# Patient Record
Sex: Female | Born: 1980 | Race: White | Hispanic: No | Marital: Married | Smoking: Never smoker
Health system: Southern US, Community
[De-identification: ages and names within clinical notes are randomized; demographics above are authoritative.]

## PROBLEM LIST (undated history)

## (undated) DIAGNOSIS — Z98811 Dental restoration status: Secondary | ICD-10-CM

## (undated) DIAGNOSIS — Z9889 Other specified postprocedural states: Secondary | ICD-10-CM

## (undated) DIAGNOSIS — M1990 Unspecified osteoarthritis, unspecified site: Secondary | ICD-10-CM

## (undated) DIAGNOSIS — F329 Major depressive disorder, single episode, unspecified: Secondary | ICD-10-CM

## (undated) DIAGNOSIS — R0981 Nasal congestion: Secondary | ICD-10-CM

## (undated) DIAGNOSIS — F419 Anxiety disorder, unspecified: Secondary | ICD-10-CM

## (undated) DIAGNOSIS — T4145XA Adverse effect of unspecified anesthetic, initial encounter: Secondary | ICD-10-CM

## (undated) DIAGNOSIS — D6851 Activated protein C resistance: Secondary | ICD-10-CM

## (undated) DIAGNOSIS — J189 Pneumonia, unspecified organism: Secondary | ICD-10-CM

## (undated) DIAGNOSIS — G43909 Migraine, unspecified, not intractable, without status migrainosus: Secondary | ICD-10-CM

## (undated) DIAGNOSIS — K760 Fatty (change of) liver, not elsewhere classified: Secondary | ICD-10-CM

## (undated) DIAGNOSIS — E559 Vitamin D deficiency, unspecified: Secondary | ICD-10-CM

## (undated) DIAGNOSIS — E039 Hypothyroidism, unspecified: Secondary | ICD-10-CM

## (undated) DIAGNOSIS — F32A Depression, unspecified: Secondary | ICD-10-CM

## (undated) DIAGNOSIS — K829 Disease of gallbladder, unspecified: Secondary | ICD-10-CM

## (undated) DIAGNOSIS — K589 Irritable bowel syndrome without diarrhea: Secondary | ICD-10-CM

## (undated) DIAGNOSIS — T8859XA Other complications of anesthesia, initial encounter: Secondary | ICD-10-CM

## (undated) DIAGNOSIS — K219 Gastro-esophageal reflux disease without esophagitis: Secondary | ICD-10-CM

## (undated) DIAGNOSIS — T8484XA Pain due to internal orthopedic prosthetic devices, implants and grafts, initial encounter: Secondary | ICD-10-CM

## (undated) DIAGNOSIS — G4730 Sleep apnea, unspecified: Secondary | ICD-10-CM

## (undated) DIAGNOSIS — M549 Dorsalgia, unspecified: Secondary | ICD-10-CM

## (undated) DIAGNOSIS — R112 Nausea with vomiting, unspecified: Secondary | ICD-10-CM

## (undated) DIAGNOSIS — R42 Dizziness and giddiness: Secondary | ICD-10-CM

## (undated) DIAGNOSIS — R7989 Other specified abnormal findings of blood chemistry: Secondary | ICD-10-CM

## (undated) DIAGNOSIS — G709 Myoneural disorder, unspecified: Secondary | ICD-10-CM

## (undated) HISTORY — PX: CHOLECYSTECTOMY: SHX55

## (undated) HISTORY — DX: Dorsalgia, unspecified: M54.9

## (undated) HISTORY — DX: Dizziness and giddiness: R42

## (undated) HISTORY — DX: Major depressive disorder, single episode, unspecified: F32.9

## (undated) HISTORY — DX: Depression, unspecified: F32.A

## (undated) HISTORY — DX: Fatty (change of) liver, not elsewhere classified: K76.0

## (undated) HISTORY — DX: Irritable bowel syndrome, unspecified: K58.9

## (undated) HISTORY — DX: Anxiety disorder, unspecified: F41.9

## (undated) HISTORY — DX: Disease of gallbladder, unspecified: K82.9

## (undated) HISTORY — DX: Vitamin D deficiency, unspecified: E55.9

---

## 2015-10-23 ENCOUNTER — Other Ambulatory Visit: Payer: Self-pay | Admitting: Occupational Medicine

## 2015-10-23 ENCOUNTER — Ambulatory Visit: Payer: Self-pay

## 2015-10-23 DIAGNOSIS — M79672 Pain in left foot: Secondary | ICD-10-CM

## 2015-10-23 DIAGNOSIS — M25572 Pain in left ankle and joints of left foot: Secondary | ICD-10-CM

## 2015-10-30 ENCOUNTER — Other Ambulatory Visit: Payer: Self-pay | Admitting: Occupational Medicine

## 2015-10-30 ENCOUNTER — Ambulatory Visit: Payer: Self-pay

## 2015-10-30 DIAGNOSIS — M79672 Pain in left foot: Secondary | ICD-10-CM

## 2015-12-03 HISTORY — PX: TONSILLECTOMY AND ADENOIDECTOMY: SHX28

## 2016-01-02 ENCOUNTER — Ambulatory Visit
Admission: RE | Admit: 2016-01-02 | Discharge: 2016-01-02 | Disposition: A | Discharge status: Home / Self Care | Payer: No Typology Code available for payment source | Source: Ambulatory Visit | Attending: Physical Medicine and Rehabilitation | Admitting: Physical Medicine and Rehabilitation

## 2016-01-02 ENCOUNTER — Other Ambulatory Visit: Payer: Self-pay | Admitting: Physical Medicine and Rehabilitation

## 2016-01-02 DIAGNOSIS — Z0000 Encounter for general adult medical examination without abnormal findings: Secondary | ICD-10-CM

## 2016-02-12 ENCOUNTER — Other Ambulatory Visit: Payer: Self-pay | Admitting: Orthopedic Surgery

## 2016-02-13 ENCOUNTER — Encounter (HOSPITAL_BASED_OUTPATIENT_CLINIC_OR_DEPARTMENT_OTHER): Payer: Self-pay | Admitting: *Deleted

## 2016-02-13 DIAGNOSIS — R0981 Nasal congestion: Secondary | ICD-10-CM

## 2016-02-13 HISTORY — DX: Nasal congestion: R09.81

## 2016-02-14 NOTE — Pre-Procedure Instructions (Signed)
Pt. seen by Dr. Gifford Shave for anesthesia airway evaluation, options for anesthesia discussed with pt.  OK to come for surgery.

## 2016-02-20 ENCOUNTER — Encounter (HOSPITAL_BASED_OUTPATIENT_CLINIC_OR_DEPARTMENT_OTHER)
Admission: RE | Disposition: A | Discharge status: Home / Self Care | Payer: Self-pay | Source: Ambulatory Visit | Attending: Orthopedic Surgery

## 2016-02-20 ENCOUNTER — Ambulatory Visit (HOSPITAL_BASED_OUTPATIENT_CLINIC_OR_DEPARTMENT_OTHER)
Admission: RE | Admit: 2016-02-20 | Discharge: 2016-02-21 | Disposition: A | Discharge status: Home / Self Care | Payer: Worker's Compensation | Source: Ambulatory Visit | Attending: Orthopedic Surgery | Admitting: Orthopedic Surgery

## 2016-02-20 ENCOUNTER — Encounter (HOSPITAL_BASED_OUTPATIENT_CLINIC_OR_DEPARTMENT_OTHER): Payer: Self-pay | Admitting: *Deleted

## 2016-02-20 ENCOUNTER — Ambulatory Visit (HOSPITAL_BASED_OUTPATIENT_CLINIC_OR_DEPARTMENT_OTHER): Payer: Worker's Compensation | Admitting: Anesthesiology

## 2016-02-20 DIAGNOSIS — K219 Gastro-esophageal reflux disease without esophagitis: Secondary | ICD-10-CM | POA: Insufficient documentation

## 2016-02-20 DIAGNOSIS — Z6841 Body Mass Index (BMI) 40.0 and over, adult: Secondary | ICD-10-CM | POA: Insufficient documentation

## 2016-02-20 DIAGNOSIS — S93325A Dislocation of tarsometatarsal joint of left foot, initial encounter: Secondary | ICD-10-CM | POA: Insufficient documentation

## 2016-02-20 DIAGNOSIS — Z886 Allergy status to analgesic agent status: Secondary | ICD-10-CM | POA: Diagnosis not present

## 2016-02-20 DIAGNOSIS — Z91048 Other nonmedicinal substance allergy status: Secondary | ICD-10-CM | POA: Diagnosis not present

## 2016-02-20 DIAGNOSIS — G43909 Migraine, unspecified, not intractable, without status migrainosus: Secondary | ICD-10-CM | POA: Insufficient documentation

## 2016-02-20 DIAGNOSIS — X58XXXA Exposure to other specified factors, initial encounter: Secondary | ICD-10-CM | POA: Diagnosis not present

## 2016-02-20 DIAGNOSIS — Z91040 Latex allergy status: Secondary | ICD-10-CM | POA: Diagnosis not present

## 2016-02-20 DIAGNOSIS — S93325D Dislocation of tarsometatarsal joint of left foot, subsequent encounter: Secondary | ICD-10-CM

## 2016-02-20 HISTORY — DX: Migraine, unspecified, not intractable, without status migrainosus: G43.909

## 2016-02-20 HISTORY — PX: TARSAL METATARSAL ARTHRODESIS: SHX2481

## 2016-02-20 HISTORY — DX: Gastro-esophageal reflux disease without esophagitis: K21.9

## 2016-02-20 HISTORY — DX: Nasal congestion: R09.81

## 2016-02-20 HISTORY — DX: Other complications of anesthesia, initial encounter: T88.59XA

## 2016-02-20 HISTORY — DX: Adverse effect of unspecified anesthetic, initial encounter: T41.45XA

## 2016-02-20 SURGERY — FUSION, TARSOMETATARSAL JOINT
Anesthesia: General | Site: Ankle | Laterality: Left

## 2016-02-20 MED ORDER — ONDANSETRON HCL 4 MG/2ML IJ SOLN: 4.0000 mg | Freq: Four times a day (QID) | INTRAMUSCULAR | Status: DC | PRN

## 2016-02-20 MED ORDER — HYDROMORPHONE HCL 1 MG/ML IJ SOLN
0.2500 mg | INTRAMUSCULAR | Status: DC | PRN
  Administered 2016-02-20 (×2): 0.5 mg via INTRAVENOUS

## 2016-02-20 MED ORDER — LIDOCAINE 2% (20 MG/ML) 5 ML SYRINGE
INTRAMUSCULAR | Status: DC | PRN
  Administered 2016-02-20: 100 mg via INTRAVENOUS

## 2016-02-20 MED ORDER — PANTOPRAZOLE SODIUM 40 MG PO TBEC: 40.0000 mg | DELAYED_RELEASE_TABLET | Freq: Every day | ORAL | Status: DC

## 2016-02-20 MED ORDER — LIDOCAINE 2% (20 MG/ML) 5 ML SYRINGE
INTRAMUSCULAR | Status: AC
  Filled 2016-02-20: qty 5

## 2016-02-20 MED ORDER — FENTANYL CITRATE (PF) 100 MCG/2ML IJ SOLN
INTRAMUSCULAR | Status: AC
  Filled 2016-02-20: qty 2

## 2016-02-20 MED ORDER — GLYCOPYRROLATE 0.2 MG/ML IJ SOLN: 0.2000 mg | Freq: Once | INTRAMUSCULAR | Status: DC | PRN

## 2016-02-20 MED ORDER — LACTATED RINGERS IV SOLN
INTRAVENOUS | Status: DC
  Administered 2016-02-20 (×2): via INTRAVENOUS
  Administered 2016-02-20: 10 mL/h via INTRAVENOUS

## 2016-02-20 MED ORDER — BUPIVACAINE HCL (PF) 0.5 % IJ SOLN
INTRAMUSCULAR | Status: DC | PRN
  Administered 2016-02-20: 30 mL via PERINEURAL

## 2016-02-20 MED ORDER — SODIUM CHLORIDE 0.9 % IV SOLN: INTRAVENOUS | Status: DC

## 2016-02-20 MED ORDER — SODIUM CHLORIDE 0.9 % IJ SOLN
INTRAMUSCULAR | Status: AC
  Filled 2016-02-20: qty 10

## 2016-02-20 MED ORDER — ONDANSETRON HCL 4 MG/2ML IJ SOLN
INTRAMUSCULAR | Status: AC
  Filled 2016-02-20: qty 2

## 2016-02-20 MED ORDER — DEXAMETHASONE SODIUM PHOSPHATE 10 MG/ML IJ SOLN
INTRAMUSCULAR | Status: AC
  Filled 2016-02-20: qty 1

## 2016-02-20 MED ORDER — MORPHINE SULFATE (PF) 4 MG/ML IV SOLN
4.0000 mg | INTRAVENOUS | Status: DC | PRN
  Administered 2016-02-20 – 2016-02-21 (×2): 2 mg via INTRAVENOUS
  Filled 2016-02-20 (×2): qty 1

## 2016-02-20 MED ORDER — ACETAMINOPHEN 650 MG RE SUPP: 650.0000 mg | Freq: Four times a day (QID) | RECTAL | Status: DC | PRN

## 2016-02-20 MED ORDER — PROMETHAZINE HCL 25 MG/ML IJ SOLN
INTRAMUSCULAR | Status: AC
  Filled 2016-02-20: qty 1

## 2016-02-20 MED ORDER — PROPOFOL 10 MG/ML IV BOLUS
INTRAVENOUS | Status: AC
  Filled 2016-02-20: qty 20

## 2016-02-20 MED ORDER — CEFAZOLIN SODIUM-DEXTROSE 2-4 GM/100ML-% IV SOLN
2.0000 g | INTRAVENOUS | Status: AC
  Administered 2016-02-20: 3 g via INTRAVENOUS

## 2016-02-20 MED ORDER — HYDROMORPHONE HCL 1 MG/ML IJ SOLN
INTRAMUSCULAR | Status: AC
  Filled 2016-02-20: qty 1

## 2016-02-20 MED ORDER — MIDAZOLAM HCL 2 MG/2ML IJ SOLN
INTRAMUSCULAR | Status: AC
  Filled 2016-02-20: qty 2

## 2016-02-20 MED ORDER — ACETAMINOPHEN 325 MG PO TABS: 650.0000 mg | ORAL_TABLET | Freq: Four times a day (QID) | ORAL | Status: DC | PRN

## 2016-02-20 MED ORDER — DOCUSATE SODIUM 100 MG PO CAPS: 100.0000 mg | ORAL_CAPSULE | Freq: Two times a day (BID) | ORAL | Status: DC

## 2016-02-20 MED ORDER — CEFAZOLIN IN D5W 1 GM/50ML IV SOLN
INTRAVENOUS | Status: AC
  Filled 2016-02-20: qty 50

## 2016-02-20 MED ORDER — ENOXAPARIN SODIUM 40 MG/0.4ML ~~LOC~~ SOLN: 40.0000 mg | SUBCUTANEOUS | 0 refills | Status: DC

## 2016-02-20 MED ORDER — ENOXAPARIN SODIUM 40 MG/0.4ML ~~LOC~~ SOLN
40.0000 mg | SUBCUTANEOUS | Status: DC
  Administered 2016-02-21: 40 mg via SUBCUTANEOUS
  Filled 2016-02-20: qty 0.4

## 2016-02-20 MED ORDER — CEFAZOLIN SODIUM-DEXTROSE 2-4 GM/100ML-% IV SOLN
INTRAVENOUS | Status: AC
  Filled 2016-02-20: qty 100

## 2016-02-20 MED ORDER — FENTANYL CITRATE (PF) 100 MCG/2ML IJ SOLN
50.0000 ug | INTRAMUSCULAR | Status: AC | PRN
  Administered 2016-02-20: 25 ug via INTRAVENOUS
  Administered 2016-02-20: 100 ug via INTRAVENOUS
  Administered 2016-02-20: 50 ug via INTRAVENOUS
  Administered 2016-02-20: 25 ug via INTRAVENOUS

## 2016-02-20 MED ORDER — OXYCODONE HCL 5 MG PO TABS: 5.0000 mg | ORAL_TABLET | ORAL | 0 refills | Status: DC | PRN

## 2016-02-20 MED ORDER — DEXAMETHASONE SODIUM PHOSPHATE 10 MG/ML IJ SOLN
INTRAMUSCULAR | Status: DC | PRN
  Administered 2016-02-20: 10 mg via INTRAVENOUS

## 2016-02-20 MED ORDER — CHLORHEXIDINE GLUCONATE 4 % EX LIQD: 60.0000 mL | Freq: Once | CUTANEOUS | Status: DC

## 2016-02-20 MED ORDER — OXYCODONE HCL 5 MG PO TABS
5.0000 mg | ORAL_TABLET | ORAL | Status: DC | PRN
  Administered 2016-02-20 – 2016-02-21 (×2): 10 mg via ORAL
  Filled 2016-02-20 (×2): qty 2

## 2016-02-20 MED ORDER — NAPROXEN 500 MG PO TABS
500.0000 mg | ORAL_TABLET | Freq: Two times a day (BID) | ORAL | Status: DC
  Filled 2016-02-20: qty 2

## 2016-02-20 MED ORDER — PROMETHAZINE HCL 25 MG/ML IJ SOLN
6.2500 mg | INTRAMUSCULAR | Status: DC | PRN
  Administered 2016-02-20: 6.25 mg via INTRAVENOUS

## 2016-02-20 MED ORDER — LACTATED RINGERS IV SOLN
INTRAVENOUS | Status: DC
Start: 2016-02-20 — End: 2016-02-21

## 2016-02-20 MED ORDER — PROPOFOL 10 MG/ML IV BOLUS
INTRAVENOUS | Status: DC | PRN
  Administered 2016-02-20: 300 mg via INTRAVENOUS

## 2016-02-20 MED ORDER — SCOPOLAMINE 1 MG/3DAYS TD PT72: 1.0000 | MEDICATED_PATCH | Freq: Once | TRANSDERMAL | Status: DC | PRN

## 2016-02-20 MED ORDER — MIDAZOLAM HCL 2 MG/2ML IJ SOLN
1.0000 mg | INTRAMUSCULAR | Status: DC | PRN
  Administered 2016-02-20: 2 mg via INTRAVENOUS

## 2016-02-20 MED ORDER — SENNA 8.6 MG PO TABS: 1.0000 | ORAL_TABLET | Freq: Two times a day (BID) | ORAL | Status: DC

## 2016-02-20 MED ORDER — ONDANSETRON HCL 4 MG/2ML IJ SOLN
INTRAMUSCULAR | Status: DC | PRN
  Administered 2016-02-20: 4 mg via INTRAVENOUS

## 2016-02-20 MED ORDER — ARTIFICIAL TEARS OP OINT
TOPICAL_OINTMENT | OPHTHALMIC | Status: AC
  Filled 2016-02-20: qty 3.5

## 2016-02-20 MED ORDER — ONDANSETRON HCL 4 MG PO TABS: 4.0000 mg | ORAL_TABLET | Freq: Four times a day (QID) | ORAL | Status: DC | PRN

## 2016-02-20 SURGICAL SUPPLY — 73 items
BANDAGE ESMARK 6X9 LF (GAUZE/BANDAGES/DRESSINGS) ×1 IMPLANT
BIT DRILL 2.5X2.75 QC CALB (BIT) ×2 IMPLANT
BIT DRILL 2.9 CANN QC NONSTRL (BIT) ×2 IMPLANT
BLADE AVERAGE 25X9 (BLADE) IMPLANT
BLADE MICRO SAGITTAL (BLADE) ×2 IMPLANT
BLADE OSC/SAG .038X5.5 CUT EDG (BLADE) IMPLANT
BLADE SURG 15 STRL LF DISP TIS (BLADE) ×2 IMPLANT
BLADE SURG 15 STRL SS (BLADE) ×2
BNDG COHESIVE 4X5 TAN STRL (GAUZE/BANDAGES/DRESSINGS) ×2 IMPLANT
BNDG COHESIVE 6X5 TAN STRL LF (GAUZE/BANDAGES/DRESSINGS) ×2 IMPLANT
BNDG ESMARK 6X9 LF (GAUZE/BANDAGES/DRESSINGS) ×2
BNDG GAUZE ELAST 4 BULKY (GAUZE/BANDAGES/DRESSINGS) IMPLANT
BUR EGG 3PK/BX (BURR) IMPLANT
CHLORAPREP W/TINT 26ML (MISCELLANEOUS) ×2 IMPLANT
COVER BACK TABLE 60X90IN (DRAPES) ×2 IMPLANT
CUFF TOURNIQUET SINGLE 34IN LL (TOURNIQUET CUFF) IMPLANT
CUFF TOURNIQUET SINGLE 44IN (TOURNIQUET CUFF) IMPLANT
DECANTER SPIKE VIAL GLASS SM (MISCELLANEOUS) IMPLANT
DRAPE C-ARM 42X72 X-RAY (DRAPES) IMPLANT
DRAPE C-ARMOR (DRAPES) IMPLANT
DRAPE EXTREMITY T 121X128X90 (DRAPE) ×2 IMPLANT
DRAPE OEC MINIVIEW 54X84 (DRAPES) ×2 IMPLANT
DRAPE U-SHAPE 47X51 STRL (DRAPES) ×2 IMPLANT
DRSG MEPITEL 4X7.2 (GAUZE/BANDAGES/DRESSINGS) ×2 IMPLANT
DRSG PAD ABDOMINAL 8X10 ST (GAUZE/BANDAGES/DRESSINGS) ×4 IMPLANT
ELECT REM PT RETURN 9FT ADLT (ELECTROSURGICAL) ×2
ELECTRODE REM PT RTRN 9FT ADLT (ELECTROSURGICAL) ×1 IMPLANT
GAUZE SPONGE 4X4 12PLY STRL (GAUZE/BANDAGES/DRESSINGS) ×2 IMPLANT
GLOVE BIO SURGEON STRL SZ8 (GLOVE) ×2 IMPLANT
GLOVE BIOGEL PI IND STRL 8 (GLOVE) ×2 IMPLANT
GLOVE BIOGEL PI INDICATOR 8 (GLOVE) ×2
GLOVE ECLIPSE 7.5 STRL STRAW (GLOVE) ×2 IMPLANT
GLOVE EXAM NITRILE MD LF STRL (GLOVE) IMPLANT
GOWN STRL REUS W/ TWL LRG LVL3 (GOWN DISPOSABLE) ×1 IMPLANT
GOWN STRL REUS W/ TWL XL LVL3 (GOWN DISPOSABLE) ×2 IMPLANT
GOWN STRL REUS W/TWL LRG LVL3 (GOWN DISPOSABLE) ×1
GOWN STRL REUS W/TWL XL LVL3 (GOWN DISPOSABLE) ×2
K-WIRE ACE 1.6X6 (WIRE) ×6
KWIRE ACE 1.6X6 (WIRE) ×3 IMPLANT
NEEDLE HYPO 22GX1.5 SAFETY (NEEDLE) IMPLANT
PACK BASIN DAY SURGERY FS (CUSTOM PROCEDURE TRAY) ×2 IMPLANT
PAD CAST 4YDX4 CTTN HI CHSV (CAST SUPPLIES) ×1 IMPLANT
PADDING CAST ABS 4INX4YD NS (CAST SUPPLIES)
PADDING CAST ABS COTTON 4X4 ST (CAST SUPPLIES) IMPLANT
PADDING CAST COTTON 4X4 STRL (CAST SUPPLIES) ×1
PADDING CAST COTTON 6X4 STRL (CAST SUPPLIES) ×2 IMPLANT
PENCIL BUTTON HOLSTER BLD 10FT (ELECTRODE) ×2 IMPLANT
SANITIZER HAND PURELL 535ML FO (MISCELLANEOUS) ×2 IMPLANT
SCREW ACE CAN 4.0 42M (Screw) ×2 IMPLANT
SCREW ACE CAN 4.0 44M (Screw) ×2 IMPLANT
SCREW CORTICAL 3.5MM  42MM (Screw) ×1 IMPLANT
SCREW CORTICAL 3.5MM 42MM (Screw) ×1 IMPLANT
SCREW CORTICAL 3.5X46MM (Screw) ×2 IMPLANT
SHEET MEDIUM DRAPE 40X70 STRL (DRAPES) ×2 IMPLANT
SLEEVE SCD COMPRESS KNEE MED (MISCELLANEOUS) ×2 IMPLANT
SPLINT FAST PLASTER 5X30 (CAST SUPPLIES) ×20
SPLINT PLASTER CAST FAST 5X30 (CAST SUPPLIES) ×20 IMPLANT
SPONGE LAP 18X18 X RAY DECT (DISPOSABLE) ×2 IMPLANT
SPONGE SURGIFOAM ABS GEL 12-7 (HEMOSTASIS) IMPLANT
STOCKINETTE 6  STRL (DRAPES) ×1
STOCKINETTE 6 STRL (DRAPES) ×1 IMPLANT
STRIP CLOSURE SKIN 1/2X4 (GAUZE/BANDAGES/DRESSINGS) IMPLANT
SUCTION FRAZIER HANDLE 10FR (MISCELLANEOUS) ×1
SUCTION TUBE FRAZIER 10FR DISP (MISCELLANEOUS) ×1 IMPLANT
SUT ETHILON 3 0 PS 1 (SUTURE) ×2 IMPLANT
SUT MNCRL AB 3-0 PS2 18 (SUTURE) ×2 IMPLANT
SUT VIC AB 0 SH 27 (SUTURE) IMPLANT
SUT VIC AB 2-0 SH 27 (SUTURE)
SUT VIC AB 2-0 SH 27XBRD (SUTURE) IMPLANT
SYR BULB 3OZ (MISCELLANEOUS) ×2 IMPLANT
TOWEL OR 17X24 6PK STRL BLUE (TOWEL DISPOSABLE) ×4 IMPLANT
TUBE CONNECTING 20X1/4 (TUBING) ×2 IMPLANT
UNDERPAD 30X30 (UNDERPADS AND DIAPERS) ×2 IMPLANT

## 2016-02-20 NOTE — Anesthesia Procedure Notes (Signed)
Procedure Name: LMA Insertion Date/Time: 02/20/2016 2:38 PM Performed by: Lieutenant Diego Pre-anesthesia Checklist: Patient identified, Emergency Drugs available, Suction available and Patient being monitored Patient Re-evaluated:Patient Re-evaluated prior to inductionOxygen Delivery Method: Circle system utilized Preoxygenation: Pre-oxygenation with 100% oxygen Intubation Type: IV induction Ventilation: Mask ventilation without difficulty LMA: LMA with gastric port inserted LMA Size: 4.0 Number of attempts: 1 Airway Equipment and Method: Bite block Placement Confirmation: positive ETCO2 and breath sounds checked- equal and bilateral Tube secured with: Tape Dental Injury: Teeth and Oropharynx as per pre-operative assessment

## 2016-02-20 NOTE — Anesthesia Preprocedure Evaluation (Signed)
Anesthesia Evaluation  Patient identified by MRN, date of birth, ID band Patient awake    Reviewed: Patient's Chart, lab work & pertinent test results  History of Anesthesia Complications (+) history of anesthetic complications  Airway Mallampati: II  TM Distance: <3 FB Neck ROM: Full  Mouth opening: Limited Mouth Opening  Dental  (+) Teeth Intact   Pulmonary    breath sounds clear to auscultation       Cardiovascular  Rhythm:Regular Rate:Normal     Neuro/Psych  Headaches,    GI/Hepatic GERD  ,  Endo/Other  Morbid obesity  Renal/GU      Musculoskeletal   Abdominal (+) + obese,   Peds  Hematology   Anesthesia Other Findings   Reproductive/Obstetrics                             Anesthesia Physical Anesthesia Plan  ASA: II  Anesthesia Plan: General and Regional   Post-op Pain Management:    Induction: Intravenous  Airway Management Planned: LMA  Additional Equipment:   Intra-op Plan:   Post-operative Plan: Extubation in OR  Informed Consent: I have reviewed the patients History and Physical, chart, labs and discussed the procedure including the risks, benefits and alternatives for the proposed anesthesia with the patient or authorized representative who has indicated his/her understanding and acceptance.   Dental advisory given  Plan Discussed with: CRNA  Anesthesia Plan Comments:         Anesthesia Quick Evaluation

## 2016-02-20 NOTE — Anesthesia Procedure Notes (Signed)
Anesthesia Regional Block:  Popliteal block  Pre-Anesthetic Checklist: ,, timeout performed, Correct Patient, Correct Site, Correct Laterality, Correct Procedure, Correct Position, site marked, Risks and benefits discussed,  Surgical consent,  Pre-op evaluation,  At surgeon's request and post-op pain management  Laterality: Left and Lower  Prep: chloraprep       Needles:      Needle Length: 9cm 9 cm Needle Gauge: 22 and 22 G  Needle insertion depth: 6 cm   Additional Needles:  Procedures: ultrasound guided (picture in chart) Popliteal block Narrative:  Start time: 02/20/2016 1:40 PM End time: 02/20/2016 1:55 PM Injection made incrementally with aspirations every 5 mL.  Performed by: Personally  Anesthesiologist: Larenz Frasier  Additional Notes: tolerated well

## 2016-02-20 NOTE — Anesthesia Postprocedure Evaluation (Signed)
Anesthesia Post Note  Patient: Janice Wolf  Procedure(s) Performed: Procedure(s) (LRB): FIRST AND 2ND  ARTHRODESIS  TARSAL METATARSAL FUSION,POSSIBLE CALCANEUS AUTOGRAFT (Left)  Patient location during evaluation: PACU Anesthesia Type: General and Regional Level of consciousness: awake and alert Pain management: pain level controlled Vital Signs Assessment: post-procedure vital signs reviewed and stable Respiratory status: spontaneous breathing, nonlabored ventilation, respiratory function stable and patient connected to nasal cannula oxygen Cardiovascular status: blood pressure returned to baseline and stable Postop Assessment: no signs of nausea or vomiting Anesthetic complications: no    Last Vitals:  Vitals:   02/20/16 1700 02/20/16 1715  BP: 129/84 109/76  Pulse: 91 79  Resp: 18 17  Temp:      Last Pain:  Vitals:   02/20/16 1715  TempSrc:   PainSc: Asleep                 Anthonny Schiller,JAMES TERRILL

## 2016-02-20 NOTE — Progress Notes (Signed)
Assisted Dr. Massagee with left, ultrasound guided, popliteal/saphenous block. Side rails up, monitors on throughout procedure. See vital signs in flow sheet. Tolerated Procedure well. 

## 2016-02-20 NOTE — Transfer of Care (Signed)
Immediate Anesthesia Transfer of Care Note  Patient: Janice Wolf  Procedure(s) Performed: Procedure(s) with comments: FIRST AND 2ND  ARTHRODESIS  TARSAL METATARSAL FUSION,POSSIBLE CALCANEUS AUTOGRAFT (Left) - FIRST AND 2ND  ARTHRODESIS  TARSAL METATARSAL FUSION,POSSIBLE CALCANEUS AUTOGRAFT  Patient Location: PACU  Anesthesia Type:General and GA combined with regional for post-op pain  Level of Consciousness: awake  Airway & Oxygen Therapy: Patient Spontanous Breathing and Patient connected to face mask oxygen  Post-op Assessment: Report given to RN and Post -op Vital signs reviewed and stable  Post vital signs: Reviewed and stable  Last Vitals:  Vitals:   02/20/16 1355 02/20/16 1400  BP:  101/66  Pulse: 76 75  Resp: 13 12  Temp:      Last Pain:  Vitals:   02/20/16 1233  TempSrc: Oral  PainSc:          Complications: No apparent anesthesia complications

## 2016-02-20 NOTE — Brief Op Note (Signed)
02/20/2016  4:08 PM  PATIENT:  Janice Wolf  35 y.o. female  PRE-OPERATIVE DIAGNOSIS:  LEFT LISFRANC CHRONIC RUPTURE  POST-OPERATIVE DIAGNOSIS:  LEFT LISFRANC CHRONIC RUPTURE  Procedure(s): 1.  Left 1st and 2nd TMT joint arthrodesis 2.  Left foot ap, lateral and oblique xrays  SURGEON:  [name], MD  ASSISTANT: n/a  ANESTHESIA:   General, regional  EBL:  minimal   TOURNIQUET:   Total Tourniquet Time Documented: Thigh (Left) - 64 minutes Total: Thigh (Left) - 64 minutes  COMPLICATIONS:  None apparent  DISPOSITION:  Extubated, awake and stable to recovery.  DICTATION ID:  [id number]

## 2016-02-20 NOTE — H&P (Signed)
Janice Wolf is an 35 y.o. female.   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  She has failed non o ptreatment and presents now for 1st and 2nd TMT arthrodesis.  Past Medical History:  Diagnosis Date  . Complication of anesthesia    was hard to wake up after T & A 12/2015; had to stay overnight  . Dental crown present   . GERD (gastroesophageal reflux disease)   . Lisfranc dislocation, left, subsequent encounter 02/2016   chronic rupture  . Migraines   . Stuffy nose 02/13/2016    Past Surgical History:  Procedure Laterality Date  . TONSILLECTOMY AND ADENOIDECTOMY  12/2015    History reviewed. No pertinent family history. Social History:  reports that she has never smoked. She has never used smokeless tobacco. She reports that she does not drink alcohol or use drugs.  Allergies:  Allergies  Allergen Reactions  . Aspirin Nausea And Vomiting  . Latex Other (See Comments)    SKIN IRRITATION  . Other Rash    PERFUMES    Medications Prior to Admission  Medication Sig Dispense Refill  . esomeprazole (NEXIUM) 20 MG capsule Take 20 mg by mouth daily at 12 noon.    [name] ibuprofen (ADVIL,MOTRIN) 200 MG tablet Take 200 mg by mouth every 6 (six) hours as needed.      No results found for this or any previous visit (from the past 48 hour(s)). No results found.  ROS  No recent f/c/n/v/wt loss  Blood pressure 101/66, pulse 75, temperature 98.7 F (37.1 C), temperature source Oral, resp. rate 12, height 5\' 7"  (1.702 m), weight 134.7 kg (297 lb), SpO2 100 %. Physical Exam  wn wd woman in nad.  A and ox  4.  Mood and affec tnormal.  EOMI.  resp unlabored.  L foot with healthy skin.  No lymphadenopathy.  5/5 strength in PF and DF of the ankle and toes.  TTP at the lisfranc joint.  Sens to LT intact at the foot.  Assessment/Plan L foot chronic lisfranc dislocation.  The risks and benefits of the alternative treatment options have  been discussed in detail.  The patient wishes to proceed with surgery and specifically understands risks of bleeding, infection, nerve damage, blood clots, need for additional surgery, amputation and death.   [name], MD 03-13-2016, 2:17 PM

## 2016-02-21 ENCOUNTER — Encounter (HOSPITAL_BASED_OUTPATIENT_CLINIC_OR_DEPARTMENT_OTHER): Payer: Self-pay | Admitting: Orthopedic Surgery

## 2016-02-21 DIAGNOSIS — S93325A Dislocation of tarsometatarsal joint of left foot, initial encounter: Secondary | ICD-10-CM | POA: Diagnosis not present

## 2016-02-21 NOTE — Op Note (Signed)
NAMEMarland Kitchen  JENNAVIE, SALYARDS NO.:  [id number]  MEDICAL RECORD NO.:  [id number]  LOCATION:                                 FACILITY:  PHYSICIAN:  [name], MD             DATE OF BIRTH:  DATE OF PROCEDURE:  02/20/2016 DATE OF DISCHARGE:                              OPERATIVE REPORT   PREOPERATIVE DIAGNOSIS:  Left foot chronic Lisfranc dislocation.  POSTOPERATIVE DIAGNOSIS:  Left foot chronic Lisfranc dislocation.  PROCEDURES: 1. Left first and second tarsometatarsal joint arthrodesis. 2. Left foot AP, lateral, and oblique radiographs.  SURGEON:  [name], M.D.  ANESTHESIA:  General, regional.  ESTIMATED BLOOD LOSS:  Minimal.  TOURNIQUET TIME:  64 minutes at 300 mmHg.  COMPLICATIONS:  None apparent.  DISPOSITION:  Extubated, awake, and stable to recovery.  INDICATIONS FOR PROCEDURE:  The patient is a 35 year old woman with past medical history significant for morbid obesity.  She had an injury to her left foot several months ago.  She was later found to have a chronic rupture of her Lisfranc joint complex involving the first and second TMT joints.  She presents now for arthrodesis of the first and second TMT joints.  She understands the risks and benefits of the alternative treatment options and elects surgical treatment.  She specifically understands risks of bleeding, infection, nerve damage, blood clots, need for additional surgery, continued pain, nonunion, amputation, and death.  PROCEDURE IN DETAIL:  After preoperative consent was obtained and the correct operative site was identified, the patient was brought to the operating room and placed supine on the operating table.  General anesthesia was induced.  Preoperative antibiotics were administered. Surgical time-out was taken.  Left lower extremity was prepped and draped in standard sterile fashion with tourniquet around the thigh. The extremity was exsanguinated and the tourniquet was  inflated to 300 mmHg.  A longitudinal incision was then made over the first and second TMT joints.  Sharp dissection was carried down through the skin, subcutaneous tissue.  The extensor hallucis longus and brevis tendons as well as the neurovascular bundle were all mobilized, retracted, and protected throughout the case.  The first and second TMT joint capsules were identified.  They were incised and the joints were both examined. They were opened and second TMT joint cartilage and subchondral bone were resected with a curette.  The oscillating saw was then used to resect the articular cartilage and subchondral bone from the first TMT joint.  The fibrous tissue between the bases of the first and second metatarsals were resected with a rongeur.  The wounds were then irrigated copiously.  A 2.5 mm drill bit was then used to perforate the remaining bone on both sides of the joint as well as at the bases of the first and second metatarsals.  The first TMT joint was then reduced.  A K-wire was inserted from proximal to distal across the lateral half of the joint.  Radiographs showed appropriate alignment.  The K-wire was over drilled and a 4-mm partially threaded cannulated screw from the [employer] small fragment set was inserted.  It was tightened and noted to compress the joint  appropriately.  A second K-wire was then inserted from distal to proximal.  This wound was over drilled and the guidewire removed.  A 3.5 mm fully threaded [employer] LPS screw was then inserted.  A K-wire was then inserted across the second TMT joint from proximal to distal.  Appropriate alignment was confirmed with fluoroscopic images. The K-wire was over drilled and a 4-mm partially threaded screw was inserted.  It was noted to compress the joint appropriately.  A tenaculum was then used to compress the first and second rays.  A K-wire was then inserted from medial to lateral across the Lisfranc joint complex in the  home-run position.  K-wire was over drilled and a 3.5 mm fully-threaded cortical screw from the [employer] small fragment set was inserted.  It was noted to have excellent purchase.  AP, oblique, and lateral radiographs confirmed appropriate position and length of all hardware and appropriate arthrodesis of the first and second TMT joints. Wound was irrigated copiously.  Subcutaneous tissues were approximated with 3-0 Monocryl and the skin incision was closed with 3-0 nylon. Sterile dressings were applied followed by well-padded short-leg splint. Tourniquet was released after application of the dressings at 64 minutes.  The patient was awakened from anesthesia and transported to the recovery room in stable condition.  FOLLOWUP PLAN:  The patient will be nonweightbearing on the left lower extremity.  She will follow up with me in the office in 2 weeks for suture removal.  She will be observed overnight for pain control.  RADIOGRAPHS:  AP, oblique, and lateral radiographs of the left foot were obtained intraoperatively.  These show interval arthrodesis of the first and second TMT joints.  Hardwares appropriately positioned and of the appropriate lengths.     [name], MD     JH/MEDQ  D:  02/20/2016  T:  02/21/2016  Job:  [id number]

## 2016-02-21 NOTE — Discharge Instructions (Signed)
Janice Simmer, MD [place]  Please read the following information regarding your care after surgery.  Medications  You only need a prescription for the narcotic pain medicine (ex. oxycodone, Percocet, Norco).  All of the other medicines listed below are available over the counter. X acetominophen (Tylenol) 650 mg every 4-6 hours as you need for minor pain X oxycodone as prescribed for moderate to severe pain ?   Narcotic pain medicine (ex. oxycodone, Percocet, Vicodin) will cause constipation.  To prevent this problem, take the following medicines while you are taking any pain medicine. X docusate sodium (Colace) 100 mg twice a day X senna (Senokot) 2 tablets twice a day  X To help prevent blood clots, take lovenox for two weeks after surgery.  You should also get up every hour while you are awake to move around.    Weight Bearing ? Bear weight when you are able on your operated leg or foot. ? Bear weight only on the heel of your operated foot in the post-op shoe. X Do not bear any weight on the operated leg or foot.  Cast / Splint / Dressing X Keep your splint or cast clean and dry.  Dont put anything (coat hanger, pencil, etc) down inside of it.  If it gets damp, use a hair dryer on the cool setting to dry it.  If it gets soaked, call the office to schedule an appointment for a cast change. ? Remove your dressing 3 days after surgery and cover the incisions with dry dressings.    After your dressing, cast or splint is removed; you may shower, but do not soak or scrub the wound.  Allow the water to run over it, and then gently pat it dry.  Swelling It is normal for you to have swelling where you had surgery.  To reduce swelling and pain, keep your toes above your nose for at least 3 days after surgery.  It may be necessary to keep your foot or leg elevated for several weeks.  If it hurts, it should be elevated.  Follow Up Call my office at [phone] when you are  discharged from the hospital or surgery center to schedule an appointment to be seen two weeks after surgery.  Call my office at [phone] if you develop a fever >101.5 F, nausea, vomiting, bleeding from the surgical site or severe pain.     Regional Anesthesia Blocks  1. Numbness or the inability to move the "blocked" extremity may last from 3-48 hours after placement. The length of time depends on the medication injected and your individual response to the medication. If the numbness is not going away after 48 hours, call your surgeon.  2. The extremity that is blocked will need to be protected until the numbness is gone and the  Strength has returned. Because you cannot feel it, you will need to take extra care to avoid injury. Because it may be weak, you may have difficulty moving it or using it. You may not know what position it is in without looking at it while the block is in effect.  3. For blocks in the legs and feet, returning to weight bearing and walking needs to be done carefully. You will need to wait until the numbness is entirely gone and the strength has returned. You should be able to move your leg and foot normally before you try and bear weight or walk. You will need someone to be with you when you first try  to ensure you do not fall and possibly risk injury.  4. Bruising and tenderness at the needle site are common side effects and will resolve in a few days.  5. Persistent numbness or new problems with movement should be communicated to the surgeon or the [facility] [phone] [place] [phone]).   Post Anesthesia Home Care Instructions  Activity: Get plenty of rest for the remainder of the day. A responsible adult should stay with you for 24 hours following the procedure.  For the next 24 hours, DO NOT: -Drive a car -Paediatric nurse -Drink alcoholic beverages -Take any medication unless instructed by your physician -Make  any legal decisions or sign important papers.  Meals: Start with liquid foods such as gelatin or soup. Progress to regular foods as tolerated. Avoid greasy, spicy, heavy foods. If nausea and/or vomiting occur, drink only clear liquids until the nausea and/or vomiting subsides. Call your physician if vomiting continues.  Special Instructions/Symptoms: Your throat may feel dry or sore from the anesthesia or the breathing tube placed in your throat during surgery. If this causes discomfort, gargle with warm salt water. The discomfort should disappear within 24 hours.  If you had a scopolamine patch placed behind your ear for the management of post- operative nausea and/or vomiting:  1. The medication in the patch is effective for 72 hours, after which it should be removed.  Wrap patch in a tissue and discard in the trash. Wash hands thoroughly with soap and water. 2. You may remove the patch earlier than 72 hours if you experience unpleasant side effects which may include dry mouth, dizziness or visual disturbances. 3. Avoid touching the patch. Wash your hands with soap and water after contact with the patch.    How and Where to Give Subcutaneous Enoxaparin Injections Enoxaparin is an injectable medicine. It is used to help prevent blood clots from developing in your veins. Health care providers often use anticoagulants like enoxaparin to prevent clots following surgery. Enoxaparin is also used in combination with other medicines to treat blood clots and heart attacks. If blood clots are left untreated, they can be life threatening.  Enoxaparin comes in single-use syringes. You inject enoxaparin through a syringe into your belly (abdomen). You should change the injection site each time you give yourself a shot. Continue the enoxaparin injections as directed by your health care provider. Your health care provider will use blood clotting test results to decide when you can safely stop using enoxaparin  injections. If your health care provider prescribes any additional medicines, use the medicines exactly as directed. HOW DO I INJECT ENOXAPARIN?  1. Wash your hands with soap and water. 2. Clean the selected injection site as directed by your health care provider. 3. Remove the needle cap by pulling it straight off the syringe. 4. Hold the syringe like a pencil using your writing hand. 5. Use your other hand to pinch and hold an inch of the cleansed skin. 6. Insert the entire needle straight down into the fold of skin. 7. Push the plunger with your thumb until the syringe is empty. 8. Pull the needle straight out of your skin. 9. Enoxaparin injection prefilled syringes and graduated prefilled syringes are available with a system that shields the needle after injection. After you have completed your injection and removed the needle from your skin, firmly push down on the plunger. The protective sleeve will automatically cover the needle and you will hear a click. The click  means the needle is safely covered. 10. Place the syringe in the nearest needle box, also called a sharps container. If you do not have a sharps container, you can use a hard-sided plastic container with a secure lid, such as an empty laundry detergent bottle. WHAT ELSE DO I NEED TO KNOW?  Do not use enoxaparin if:  You have allergies to heparin or pork products.  You have been diagnosed with a condition called thrombocytopenia.  Do not use the syringe or needle more than one time.  Use medicines only as directed by your health care provider.  Changes in medicines, supplements, diet, and illness can affect your anticoagulation therapy. Be sure to inform your health care provider of any of these changes.  It is important that you tell all of your health care providers and your dentist that you are taking an anticoagulant, especially if you are injured or plan to have any type of procedure.  While on anticoagulants, you  will need to have blood tests done routinely as directed by your health care provider.  While using this medicine, avoid physical activities or sports that could result in a fall or cause injury.  Follow up with your laboratory test and health care provider appointments as directed. It is very important to keep your appointments. Not keeping appointments could result in a chronic or permanent injury, pain, or disability.  Before giving your medicine, you should make sure the injection is a clear and colorless or pale yellow solution. If your medicine becomes discolored or if there are particles in the syringe, do not use it and notify your health care provider.  Keep your medicine safely stored at room temperature. SEEK MEDICAL CARE IF:  You develop any rashes on your skin.  You have large areas of bruising on your skin.  You have any worsening of the condition for which you take Enoxaparin.  You develop a fever. SEEK IMMEDIATE MEDICAL CARE IF:  You develop bleeding problems such as:  Bleeding from the gums or nose that does not stop quickly.  Vomiting blood or coughing up blood.  Blood in your urine.  Blood in your stool, or stool that has a dark, tarry, or coffee grounds appearance.  A cut that does not stop bleeding within 10 minutes. These symptoms may represent a serious problem that is an emergency. Do not wait to see if the symptoms will go away. Get medical help right away. Call your local emergency services (911 in the [place] Do not drive yourself to the hospital.    This information is not intended to replace advice given to you by your health care provider. Make sure you discuss any questions you have with your health care provider.   Document Released: 02/20/2004 Document Revised: 05/11/2014 Document Reviewed: 10/05/2013 Elsevier Interactive Patient Education 2016 [employer] Enoxaparin injection What is this medicine? ENOXAPARIN (ee nox a PA rin) is used after  knee, hip, or abdominal surgeries to prevent blood clotting. It is also used to treat existing blood clots in the lungs or in the veins. This medicine may be used for other purposes; ask your health care provider or pharmacist if you have questions. What should I tell my health care provider before I take this medicine? They need to know if you have any of these conditions: -bleeding disorders, hemorrhage, or hemophilia -infection of the heart or heart valves -kidney or liver disease -previous stroke -prosthetic heart valve -recent surgery or delivery of a baby -ulcer in  the stomach or intestine, diverticulitis, or other bowel disease -an unusual or allergic reaction to enoxaparin, heparin, pork or pork products, other medicines, foods, dyes, or preservatives -pregnant or trying to get pregnant -breast-feeding How should I use this medicine? This medicine is for injection under the skin. It is usually given by a health-care professional. You or a family member may be trained on how to give the injections. If you are to give yourself injections, make sure you understand how to use the syringe, measure the dose if necessary, and give the injection. To avoid bruising, do not rub the site where this medicine has been injected. Do not take your medicine more often than directed. Do not stop taking except on the advice of your doctor or health care professional. Make sure you receive a puncture-resistant container to dispose of the needles and syringes once you have finished with them. Do not reuse these items. Return the container to your doctor or health care professional for proper disposal. Talk to your pediatrician regarding the use of this medicine in children. Special care may be needed. Overdosage: If you think you have taken too much of this medicine contact a poison control center or emergency room at once. NOTE: This medicine is only for you. Do not share this medicine with others. What if I  miss a dose? If you miss a dose, take it as soon as you can. If it is almost time for your next dose, take only that dose. Do not take double or extra doses. What may interact with this medicine? -aspirin and aspirin-like medicines -certain medicines that treat or prevent blood clots -dipyridamole -NSAIDs, medicines for pain and inflammation, like ibuprofen or naproxen This list may not describe all possible interactions. Give your health care provider a list of all the medicines, herbs, non-prescription drugs, or dietary supplements you use. Also tell them if you smoke, drink alcohol, or use illegal drugs. Some items may interact with your medicine. What should I watch for while using this medicine? Visit your doctor or health care professional for regular checks on your progress. Your condition will be monitored carefully while you are receiving this medicine. Notify your doctor or health care professional and seek emergency treatment if you develop breathing problems; changes in vision; chest pain; severe, sudden headache; pain, swelling, warmth in the leg; trouble speaking; sudden numbness or weakness of the face, arm, or leg. These can be signs that your condition has gotten worse. If you are going to have surgery, tell your doctor or health care professional that you are taking this medicine. Do not stop taking this medicine without first talking to your doctor. Be sure to refill your prescription before you run out of medicine. Avoid sports and activities that might cause injury while you are using this medicine. Severe falls or injuries can cause unseen bleeding. Be careful when using sharp tools or knives. Consider using an Copy. Take special care brushing or flossing your teeth. Report any injuries, bruising, or red spots on the skin to your doctor or health care professional. What side effects may I notice from receiving this medicine? Side effects that you should report to your  doctor or health care professional as soon as possible: -allergic reactions like skin rash, itching or hives, swelling of the face, lips, or tongue -feeling faint or lightheaded, falls -signs and symptoms of bleeding such as bloody or black, tarry stools; red or dark-brown urine; spitting up blood or brown material that looks  like coffee grounds; red spots on the skin; unusual bruising or bleeding from the eye, gums, or nose Side effects that usually do not require medical attention (report to your doctor or health care professional if they continue or are bothersome): -pain, redness, or irritation at site where injected This list may not describe all possible side effects. Call your doctor for medical advice about side effects. You may report side effects to FDA at 1-800-FDA-1088. Where should I keep my medicine? Keep out of the reach of children. Store at room temperature between 15 and 30 degrees C (59 and 86 degrees F). Do not freeze. If your injections have been specially prepared, you may need to store them in the refrigerator. Ask your pharmacist. Throw away any unused medicine after the expiration date. NOTE: This sheet is a summary. It may not cover all possible information. If you have questions about this medicine, talk to your doctor, pharmacist, or health care provider.    2016, Elsevier/Gold Standard. (2013-08-22 16:06:21)

## 2016-03-03 ENCOUNTER — Encounter (HOSPITAL_BASED_OUTPATIENT_CLINIC_OR_DEPARTMENT_OTHER): Payer: Self-pay | Admitting: Orthopedic Surgery

## 2016-06-01 DIAGNOSIS — E039 Hypothyroidism, unspecified: Secondary | ICD-10-CM | POA: Diagnosis not present

## 2016-06-09 ENCOUNTER — Other Ambulatory Visit: Payer: Self-pay | Admitting: Family Medicine

## 2016-06-09 DIAGNOSIS — E038 Other specified hypothyroidism: Secondary | ICD-10-CM

## 2016-06-15 ENCOUNTER — Other Ambulatory Visit: Payer: Self-pay

## 2016-06-18 ENCOUNTER — Ambulatory Visit
Admission: RE | Admit: 2016-06-18 | Discharge: 2016-06-18 | Disposition: A | Discharge status: Home / Self Care | Payer: 59 | Source: Ambulatory Visit | Attending: Family Medicine | Admitting: Family Medicine

## 2016-06-18 DIAGNOSIS — E042 Nontoxic multinodular goiter: Secondary | ICD-10-CM | POA: Diagnosis not present

## 2016-06-18 DIAGNOSIS — E038 Other specified hypothyroidism: Secondary | ICD-10-CM

## 2016-08-31 ENCOUNTER — Other Ambulatory Visit: Payer: Self-pay | Admitting: Orthopedic Surgery

## 2016-09-01 DIAGNOSIS — T8484XA Pain due to internal orthopedic prosthetic devices, implants and grafts, initial encounter: Secondary | ICD-10-CM

## 2016-09-01 HISTORY — DX: Pain due to internal orthopedic prosthetic devices, implants and grafts, initial encounter: T84.84XA

## 2016-09-03 ENCOUNTER — Encounter (HOSPITAL_BASED_OUTPATIENT_CLINIC_OR_DEPARTMENT_OTHER): Payer: Self-pay | Admitting: *Deleted

## 2016-09-03 NOTE — Pre-Procedure Instructions (Signed)
Pt. OK for surgery - does not need to come in for re-evaluation of airway, per Dr. Marcell Barlow.

## 2016-09-08 DIAGNOSIS — J309 Allergic rhinitis, unspecified: Secondary | ICD-10-CM | POA: Diagnosis not present

## 2016-09-08 DIAGNOSIS — J069 Acute upper respiratory infection, unspecified: Secondary | ICD-10-CM | POA: Diagnosis not present

## 2016-09-08 DIAGNOSIS — E039 Hypothyroidism, unspecified: Secondary | ICD-10-CM | POA: Diagnosis not present

## 2016-09-08 NOTE — Pre-Procedure Instructions (Signed)
Pt. called to say that she was diagnosed with sinus infection today, received steroid injection in PCP office and will be starting antibiotic; was advised to postpone surgery until she is better.  Left message for Velvet at Dr. Nona Dell office to notify her of same.

## 2016-09-23 ENCOUNTER — Encounter (HOSPITAL_BASED_OUTPATIENT_CLINIC_OR_DEPARTMENT_OTHER): Payer: Self-pay | Admitting: *Deleted

## 2016-09-24 DIAGNOSIS — E039 Hypothyroidism, unspecified: Secondary | ICD-10-CM | POA: Diagnosis not present

## 2016-10-01 ENCOUNTER — Ambulatory Visit (HOSPITAL_BASED_OUTPATIENT_CLINIC_OR_DEPARTMENT_OTHER)
Admission: RE | Admit: 2016-10-01 | Discharge: 2016-10-01 | Disposition: A | Discharge status: Home / Self Care | Payer: Worker's Compensation | Source: Ambulatory Visit | Attending: Orthopedic Surgery | Admitting: Orthopedic Surgery

## 2016-10-01 ENCOUNTER — Encounter (HOSPITAL_BASED_OUTPATIENT_CLINIC_OR_DEPARTMENT_OTHER): Payer: Self-pay | Admitting: Certified Registered"

## 2016-10-01 ENCOUNTER — Ambulatory Visit (HOSPITAL_BASED_OUTPATIENT_CLINIC_OR_DEPARTMENT_OTHER): Payer: Worker's Compensation | Admitting: Certified Registered"

## 2016-10-01 ENCOUNTER — Encounter (HOSPITAL_BASED_OUTPATIENT_CLINIC_OR_DEPARTMENT_OTHER)
Admission: RE | Disposition: A | Discharge status: Home / Self Care | Payer: Self-pay | Source: Ambulatory Visit | Attending: Orthopedic Surgery

## 2016-10-01 DIAGNOSIS — T8484XA Pain due to internal orthopedic prosthetic devices, implants and grafts, initial encounter: Secondary | ICD-10-CM | POA: Diagnosis not present

## 2016-10-01 DIAGNOSIS — R51 Headache: Secondary | ICD-10-CM | POA: Diagnosis not present

## 2016-10-01 DIAGNOSIS — Z969 Presence of functional implant, unspecified: Secondary | ICD-10-CM

## 2016-10-01 DIAGNOSIS — Z6841 Body Mass Index (BMI) 40.0 and over, adult: Secondary | ICD-10-CM | POA: Insufficient documentation

## 2016-10-01 DIAGNOSIS — K219 Gastro-esophageal reflux disease without esophagitis: Secondary | ICD-10-CM | POA: Insufficient documentation

## 2016-10-01 DIAGNOSIS — E039 Hypothyroidism, unspecified: Secondary | ICD-10-CM | POA: Diagnosis not present

## 2016-10-01 DIAGNOSIS — Z79899 Other long term (current) drug therapy: Secondary | ICD-10-CM | POA: Insufficient documentation

## 2016-10-01 HISTORY — DX: Nausea with vomiting, unspecified: R11.2

## 2016-10-01 HISTORY — DX: Pain due to internal orthopedic prosthetic devices, implants and grafts, initial encounter: T84.84XA

## 2016-10-01 HISTORY — DX: Dental restoration status: Z98.811

## 2016-10-01 HISTORY — DX: Hypothyroidism, unspecified: E03.9

## 2016-10-01 HISTORY — PX: HARDWARE REMOVAL: SHX979

## 2016-10-01 HISTORY — DX: Other specified abnormal findings of blood chemistry: R79.89

## 2016-10-01 HISTORY — DX: Other specified postprocedural states: Z98.890

## 2016-10-01 SURGERY — REMOVAL, HARDWARE
Anesthesia: General | Site: Foot | Laterality: Left

## 2016-10-01 MED ORDER — GABAPENTIN 300 MG PO CAPS
ORAL_CAPSULE | ORAL | Status: AC
  Filled 2016-10-01: qty 1

## 2016-10-01 MED ORDER — FENTANYL CITRATE (PF) 100 MCG/2ML IJ SOLN: 50.0000 ug | INTRAMUSCULAR | Status: DC | PRN

## 2016-10-01 MED ORDER — PROMETHAZINE HCL 25 MG/ML IJ SOLN
6.2500 mg | INTRAMUSCULAR | Status: DC | PRN
  Administered 2016-10-01: 6.25 mg via INTRAVENOUS

## 2016-10-01 MED ORDER — SODIUM CHLORIDE 0.9 % IV SOLN: INTRAVENOUS | Status: DC

## 2016-10-01 MED ORDER — FENTANYL CITRATE (PF) 100 MCG/2ML IJ SOLN
50.0000 ug | INTRAMUSCULAR | Status: AC | PRN
  Administered 2016-10-01: 25 ug via INTRAVENOUS
  Administered 2016-10-01: 100 ug via INTRAVENOUS
  Administered 2016-10-01: 25 ug via INTRAVENOUS

## 2016-10-01 MED ORDER — FENTANYL CITRATE (PF) 100 MCG/2ML IJ SOLN
INTRAMUSCULAR | Status: AC
  Filled 2016-10-01: qty 2

## 2016-10-01 MED ORDER — HYDROCODONE-ACETAMINOPHEN 5-325 MG PO TABS: 1.0000 | ORAL_TABLET | Freq: Four times a day (QID) | ORAL | 0 refills | Status: DC | PRN

## 2016-10-01 MED ORDER — MIDAZOLAM HCL 2 MG/2ML IJ SOLN
1.0000 mg | INTRAMUSCULAR | Status: DC | PRN
Start: 2016-10-01 — End: 2016-10-01
  Administered 2016-10-01: 2 mg via INTRAVENOUS

## 2016-10-01 MED ORDER — ACETAMINOPHEN 500 MG PO TABS
1000.0000 mg | ORAL_TABLET | Freq: Once | ORAL | Status: AC
  Administered 2016-10-01: 1000 mg via ORAL

## 2016-10-01 MED ORDER — DEXTROSE 5 % IV SOLN: 3.0000 g | Freq: Once | INTRAVENOUS | Status: DC

## 2016-10-01 MED ORDER — ACETAMINOPHEN 500 MG PO TABS
ORAL_TABLET | ORAL | Status: AC
  Filled 2016-10-01: qty 2

## 2016-10-01 MED ORDER — OXYCODONE HCL 5 MG PO TABS
ORAL_TABLET | ORAL | Status: AC
  Filled 2016-10-01: qty 1

## 2016-10-01 MED ORDER — LACTATED RINGERS IV SOLN
INTRAVENOUS | Status: DC
  Administered 2016-10-01 (×2): via INTRAVENOUS

## 2016-10-01 MED ORDER — CEFAZOLIN SODIUM-DEXTROSE 2-4 GM/100ML-% IV SOLN
INTRAVENOUS | Status: AC
  Filled 2016-10-01: qty 100

## 2016-10-01 MED ORDER — MIDAZOLAM HCL 2 MG/2ML IJ SOLN
1.0000 mg | INTRAMUSCULAR | Status: DC | PRN
Start: 2016-10-01 — End: 2016-10-01

## 2016-10-01 MED ORDER — GABAPENTIN 300 MG PO CAPS
300.0000 mg | ORAL_CAPSULE | Freq: Once | ORAL | Status: AC
  Administered 2016-10-01: 300 mg via ORAL

## 2016-10-01 MED ORDER — PROMETHAZINE HCL 25 MG/ML IJ SOLN
INTRAMUSCULAR | Status: AC
  Filled 2016-10-01: qty 1

## 2016-10-01 MED ORDER — SCOPOLAMINE 1 MG/3DAYS TD PT72: 1.0000 | MEDICATED_PATCH | Freq: Once | TRANSDERMAL | Status: DC | PRN

## 2016-10-01 MED ORDER — OXYCODONE HCL 5 MG PO TABS
5.0000 mg | ORAL_TABLET | Freq: Once | ORAL | Status: AC
  Administered 2016-10-01: 5 mg via ORAL

## 2016-10-01 MED ORDER — PROPOFOL 10 MG/ML IV BOLUS
INTRAVENOUS | Status: DC | PRN
  Administered 2016-10-01: 200 mg via INTRAVENOUS

## 2016-10-01 MED ORDER — DEXAMETHASONE SODIUM PHOSPHATE 10 MG/ML IJ SOLN
INTRAMUSCULAR | Status: DC | PRN
  Administered 2016-10-01: 10 mg via INTRAVENOUS

## 2016-10-01 MED ORDER — CEFAZOLIN SODIUM-DEXTROSE 2-4 GM/100ML-% IV SOLN
2.0000 g | INTRAVENOUS | Status: DC
  Administered 2016-10-01: 2 g via INTRAVENOUS

## 2016-10-01 MED ORDER — MIDAZOLAM HCL 2 MG/2ML IJ SOLN
INTRAMUSCULAR | Status: AC
  Filled 2016-10-01: qty 2

## 2016-10-01 MED ORDER — LACTATED RINGERS IV SOLN: INTRAVENOUS | Status: DC

## 2016-10-01 MED ORDER — BUPIVACAINE-EPINEPHRINE 0.5% -1:200000 IJ SOLN
INTRAMUSCULAR | Status: DC | PRN
  Administered 2016-10-01: 15 mL

## 2016-10-01 MED ORDER — FENTANYL CITRATE (PF) 100 MCG/2ML IJ SOLN: 25.0000 ug | INTRAMUSCULAR | Status: DC | PRN

## 2016-10-01 MED ORDER — BUPIVACAINE-EPINEPHRINE (PF) 0.5% -1:200000 IJ SOLN
INTRAMUSCULAR | Status: AC
  Filled 2016-10-01: qty 30

## 2016-10-01 MED ORDER — LIDOCAINE HCL (CARDIAC) 20 MG/ML IV SOLN
INTRAVENOUS | Status: DC | PRN
  Administered 2016-10-01: 60 mg via INTRAVENOUS

## 2016-10-01 MED ORDER — CHLORHEXIDINE GLUCONATE 4 % EX LIQD: 60.0000 mL | Freq: Once | CUTANEOUS | Status: DC

## 2016-10-01 MED ORDER — CEFAZOLIN SODIUM-DEXTROSE 1-4 GM/50ML-% IV SOLN
INTRAVENOUS | Status: AC
  Filled 2016-10-01: qty 50

## 2016-10-01 SURGICAL SUPPLY — 63 items
BANDAGE ACE 4X5 VEL STRL LF (GAUZE/BANDAGES/DRESSINGS) ×3 IMPLANT
BANDAGE ESMARK 6X9 LF (GAUZE/BANDAGES/DRESSINGS) IMPLANT
BENZOIN TINCTURE PRP APPL 2/3 (GAUZE/BANDAGES/DRESSINGS) IMPLANT
BLADE SURG 15 STRL LF DISP TIS (BLADE) ×2 IMPLANT
BLADE SURG 15 STRL SS (BLADE) ×4
BNDG COHESIVE 4X5 TAN STRL (GAUZE/BANDAGES/DRESSINGS) ×3 IMPLANT
BNDG COHESIVE 6X5 TAN STRL LF (GAUZE/BANDAGES/DRESSINGS) IMPLANT
BNDG ESMARK 4X9 LF (GAUZE/BANDAGES/DRESSINGS) IMPLANT
BNDG ESMARK 6X9 LF (GAUZE/BANDAGES/DRESSINGS)
CHLORAPREP W/TINT 26ML (MISCELLANEOUS) ×3 IMPLANT
CLOSURE WOUND 1/2 X4 (GAUZE/BANDAGES/DRESSINGS)
COVER BACK TABLE 60X90IN (DRAPES) ×3 IMPLANT
CUFF TOURNIQUET SINGLE 24IN (TOURNIQUET CUFF) ×3 IMPLANT
CUFF TOURNIQUET SINGLE 34IN LL (TOURNIQUET CUFF) IMPLANT
DECANTER SPIKE VIAL GLASS SM (MISCELLANEOUS) IMPLANT
DRAPE EXTREMITY T 121X128X90 (DRAPE) ×3 IMPLANT
DRAPE OEC MINIVIEW 54X84 (DRAPES) ×3 IMPLANT
DRAPE SURG 17X23 STRL (DRAPES) ×3 IMPLANT
DRAPE U-SHAPE 47X51 STRL (DRAPES) ×3 IMPLANT
DRSG MEPITEL 4X7.2 (GAUZE/BANDAGES/DRESSINGS) ×3 IMPLANT
DRSG PAD ABDOMINAL 8X10 ST (GAUZE/BANDAGES/DRESSINGS) IMPLANT
ELECT REM PT RETURN 9FT ADLT (ELECTROSURGICAL) ×3
ELECTRODE REM PT RTRN 9FT ADLT (ELECTROSURGICAL) ×1 IMPLANT
GAUZE SPONGE 4X4 12PLY STRL (GAUZE/BANDAGES/DRESSINGS) ×3 IMPLANT
GLOVE BIO SURGEON STRL SZ8 (GLOVE) IMPLANT
GLOVE BIOGEL PI IND STRL 8 (GLOVE) ×1 IMPLANT
GLOVE BIOGEL PI INDICATOR 8 (GLOVE) ×2
GLOVE ECLIPSE 8.0 STRL XLNG CF (GLOVE) IMPLANT
GLOVE SURG SS PI 6.5 STRL IVOR (GLOVE) ×3 IMPLANT
GOWN STRL REUS W/ TWL LRG LVL3 (GOWN DISPOSABLE) ×1 IMPLANT
GOWN STRL REUS W/ TWL XL LVL3 (GOWN DISPOSABLE) ×1 IMPLANT
GOWN STRL REUS W/TWL LRG LVL3 (GOWN DISPOSABLE) ×2
GOWN STRL REUS W/TWL XL LVL3 (GOWN DISPOSABLE) ×2
NEEDLE HYPO 22GX1.5 SAFETY (NEEDLE) ×3 IMPLANT
PACK BASIN DAY SURGERY FS (CUSTOM PROCEDURE TRAY) ×3 IMPLANT
PAD CAST 4YDX4 CTTN HI CHSV (CAST SUPPLIES) ×1 IMPLANT
PADDING CAST ABS 4INX4YD NS (CAST SUPPLIES)
PADDING CAST ABS COTTON 4X4 ST (CAST SUPPLIES) IMPLANT
PADDING CAST COTTON 4X4 STRL (CAST SUPPLIES) ×2
PADDING CAST COTTON 6X4 STRL (CAST SUPPLIES) IMPLANT
PENCIL BUTTON HOLSTER BLD 10FT (ELECTRODE) IMPLANT
SANITIZER HAND PURELL 535ML FO (MISCELLANEOUS) ×3 IMPLANT
SHEET MEDIUM DRAPE 40X70 STRL (DRAPES) ×3 IMPLANT
SLEEVE SCD COMPRESS KNEE MED (MISCELLANEOUS) ×3 IMPLANT
SPLINT FAST PLASTER 5X30 (CAST SUPPLIES)
SPLINT PLASTER CAST FAST 5X30 (CAST SUPPLIES) IMPLANT
SPONGE LAP 18X18 X RAY DECT (DISPOSABLE) ×3 IMPLANT
STOCKINETTE 6  STRL (DRAPES) ×2
STOCKINETTE 6 STRL (DRAPES) ×1 IMPLANT
STRIP CLOSURE SKIN 1/2X4 (GAUZE/BANDAGES/DRESSINGS) IMPLANT
SUCTION FRAZIER HANDLE 10FR (MISCELLANEOUS) ×2
SUCTION TUBE FRAZIER 10FR DISP (MISCELLANEOUS) ×1 IMPLANT
SUT ETHILON 3 0 PS 1 (SUTURE) ×3 IMPLANT
SUT MNCRL AB 3-0 PS2 18 (SUTURE) IMPLANT
SUT VIC AB 0 SH 27 (SUTURE) IMPLANT
SUT VIC AB 2-0 SH 27 (SUTURE)
SUT VIC AB 2-0 SH 27XBRD (SUTURE) IMPLANT
SYR BULB 3OZ (MISCELLANEOUS) ×3 IMPLANT
SYR CONTROL 10ML LL (SYRINGE) ×3 IMPLANT
TOWEL OR 17X24 6PK STRL BLUE (TOWEL DISPOSABLE) ×3 IMPLANT
TUBE CONNECTING 20'X1/4 (TUBING) ×1
TUBE CONNECTING 20X1/4 (TUBING) ×2 IMPLANT
UNDERPAD 30X30 (UNDERPADS AND DIAPERS) ×3 IMPLANT

## 2016-10-01 NOTE — Discharge Instructions (Addendum)
[name], MD [place]  Please read the following information regarding your care after surgery.  Medications  You only need a prescription for the narcotic pain medicine (ex. oxycodone, Percocet, Norco).  All of the other medicines listed below are available over the counter. X ibuprofen 800 mg every 8 hours as you need for minor to moerate pain X Norco as prescribed for severe pain ?   Narcotic pain medicine (ex. oxycodone, Percocet, Vicodin) will cause constipation.  To prevent this problem, take the following medicines while you are taking any pain medicine. X docusate sodium (Colace) 100 mg twice a day X senna (Senokot) 2 tablets twice a day  Weight Bearing X Bear weight when you are able on your operated leg or foot. ? Bear weight only on the heel of your operated foot in the post-op shoe. ? Do not bear any weight on the operated leg or foot.  Cast / Splint / Dressing ? Keep your splint or cast clean and dry.  Dont put anything (coat hanger, pencil, etc) down inside of it.  If it gets damp, use a hair dryer on the cool setting to dry it.  If it gets soaked, call the office to schedule an appointment for a cast change. X Remove your dressing 3 days after surgery and cover the incisions with dry dressings.    After your dressing, cast or splint is removed; you may shower, but do not soak or scrub the wound.  Allow the water to run over it, and then gently pat it dry.  Swelling It is normal for you to have swelling where you had surgery.  To reduce swelling and pain, keep your toes above your nose for at least 3 days after surgery.  It may be necessary to keep your foot or leg elevated for several weeks.  If it hurts, it should be elevated.  Follow Up Call my office at [phone] when you are discharged from the hospital or surgery center to schedule an appointment to be seen two weeks after surgery.  Call my office at [phone] if you develop a fever >101.5  F, nausea, vomiting, bleeding from the surgical site or severe pain.      Post Anesthesia Home Care Instructions  Activity: Get plenty of rest for the remainder of the day. A responsible individual must stay with you for 24 hours following the procedure.  For the next 24 hours, DO NOT: -Drive a car -Paediatric nurse -Drink alcoholic beverages -Take any medication unless instructed by your physician -Make any legal decisions or sign important papers.  Meals: Start with liquid foods such as gelatin or soup. Progress to regular foods as tolerated. Avoid greasy, spicy, heavy foods. If nausea and/or vomiting occur, drink only clear liquids until the nausea and/or vomiting subsides. Call your physician if vomiting continues.  Special Instructions/Symptoms: Your throat may feel dry or sore from the anesthesia or the breathing tube placed in your throat during surgery. If this causes discomfort, gargle with warm salt water. The discomfort should disappear within 24 hours.  If you had a scopolamine patch placed behind your ear for the management of post- operative nausea and/or vomiting:  1. The medication in the patch is effective for 72 hours, after which it should be removed.  Wrap patch in a tissue and discard in the trash. Wash hands thoroughly with soap and water. 2. You may remove the patch earlier than 72 hours if you experience unpleasant side effects which may include  dry mouth, dizziness or visual disturbances. 3. Avoid touching the patch. Wash your hands with soap and water after contact with the patch.     Regional Anesthesia Blocks  1. Numbness or the inability to move the "blocked" extremity may last from 3-48 hours after placement. The length of time depends on the medication injected and your individual response to the medication. If the numbness is not going away after 48 hours, call your surgeon.  2. The extremity that is blocked will need to be protected until the  numbness is gone and the  Strength has returned. Because you cannot feel it, you will need to take extra care to avoid injury. Because it may be weak, you may have difficulty moving it or using it. You may not know what position it is in without looking at it while the block is in effect.  3. For blocks in the legs and feet, returning to weight bearing and walking needs to be done carefully. You will need to wait until the numbness is entirely gone and the strength has returned. You should be able to move your leg and foot normally before you try and bear weight or walk. You will need someone to be with you when you first try to ensure you do not fall and possibly risk injury.  4. Bruising and tenderness at the needle site are common side effects and will resolve in a few days.  5. Persistent numbness or new problems with movement should be communicated to the surgeon or the [place] [phone] [place] [phone]).

## 2016-10-01 NOTE — Brief Op Note (Signed)
10/01/2016  1:05 PM  PATIENT:  Janice Wolf  36 y.o. female  PRE-OPERATIVE DIAGNOSIS:  Left foot painful hardware s/p midfoot fusion  POST-OPERATIVE DIAGNOSIS:  same  Procedure(s): 1.  Removal of deep implant from the medial cuneiform 2.  Removal of deep implant from the middle cuneiform (separate incision) 3.  Removal of deep implant from the 1st MT (separate incision) 4.  Removal of deep implant from the 2nd MT (separate incision) 5.  AP and lateral xrays of the left foot  SURGEON:  [name], MD  ASSISTANT: n/a  ANESTHESIA:   General  EBL:  minimal   TOURNIQUET:   Total Tourniquet Time Documented: Calf (Left) - 26 minutes Total: Calf (Left) - 26 minutes  COMPLICATIONS:  None apparent  DISPOSITION:  Extubated, awake and stable to recovery.  DICTATION ID:  [id number]

## 2016-10-01 NOTE — Transfer of Care (Signed)
Immediate Anesthesia Transfer of Care Note  Patient: Janice Wolf  Procedure(s) Performed: Procedure(s): HARDWARE REMOVAL left foot (Left)  Patient Location: PACU  Anesthesia Type:General  Level of Consciousness: awake and patient cooperative  Airway & Oxygen Therapy: Patient Spontanous Breathing and Patient connected to face mask oxygen  Post-op Assessment: Report given to RN and Post -op Vital signs reviewed and stable  Post vital signs: Reviewed and stable  Last Vitals:  Vitals:   10/01/16 1056  BP: 107/71  Pulse: 75  Resp: 20  Temp: 36.8 C    Last Pain:  Vitals:   10/01/16 1056  TempSrc: Oral  PainSc:       Patients Stated Pain Goal: 0 (16/10/96 0454)  Complications: No apparent anesthesia complications

## 2016-10-01 NOTE — Op Note (Signed)
NAMEGERA, INBODEN             ACCOUNT NO.:  [id number]  MEDICAL RECORD NO.:  [id number]  LOCATION:                                 FACILITY:  PHYSICIAN:  [name], MD             DATE OF BIRTH:  DATE OF PROCEDURE:  10/01/2016 DATE OF DISCHARGE:                              OPERATIVE REPORT   PREOPERATIVE DIAGNOSIS:  Left foot painful hardware status post midfoot fusion.  POSTOPERATIVE DIAGNOSIS:  Left foot painful hardware status post midfoot fusion.  PROCEDURE: 1. Removal of deep implant from the medial cuneiform. 2. Removal of deep implant from the middle cuneiform through a     separate incision. 3. Removal of deep implant from the first metatarsal through a     separate incision. 4. Removal of deep implant from the second metatarsal through a     separate incision. 5. AP and lateral radiographs of the left foot.  SURGEON:  [name], MD.  ANESTHESIA:  General.  ESTIMATED BLOOD LOSS:  Minimal.  TOURNIQUET TIME:  26 minutes at 250 mmHg.  COMPLICATIONS:  None apparent.  DISPOSITION:  Extubated, awake, and stable to recovery.  INDICATIONS FOR PROCEDURE:  The patient is a 36 year old woman, who is now status post left midfoot fusion for a Lisfranc injury that happened at work.  She continues to have some midfoot pain that appears to be from the retained hardware.  She presents now for removal of deep implants from the left foot.  CT scan shows solid fusion across the first and second TMT joints.  She understands the risks and benefits of the alternative treatment options and elects surgical treatment.  She specifically understands risks of bleeding, infection, nerve damage, blood clots, need for additional surgery, continued pain, amputation, and death.  PROCEDURE IN DETAIL:  After preoperative consent was obtained and the correct operative site was identified, the patient was brought to the operating room and placed supine on the operating table.   General anesthesia was induced.  Preoperative antibiotics were administered. Surgical time-out was taken.  The left lower extremity was prepped and draped in standard sterile fashion with tourniquet around the calf.  The extremity was exsanguinated and tourniquet was inflated to 200 mmHg.  A K-wire was inserted through the dorsal skin of the foot and into the screw at the first metatarsal.  An AP radiograph confirmed appropriate position of the guide pin.  Stab incision was made and dissection was carried down through the subcutaneous tissues.  The head of the screw was cleared of all soft tissue.  The screwdriver was used to remove the screw in its entirety.  This was repeated for the middle cuneiform screw through a separate lateral incision in the same fashion.  Again, that screw was removed in its entirety.  The 1st MT screw was then identified.  A longitudinal incision was made over top of it and dissection was carried down through the subcutaneous tissues to the dorsum of the first metatarsal.  A trephine was used to remove overgrown bone from the head of the screw.  The screw was then removed in its entirety from the first metatarsal.  Attention  was then turned to the medial aspect of the midfoot where the previous incision was identified.  The incision was opened again sharply and dissection was carried down through subcutaneous tissues.  The screw at the medial cuneiform extending into the second metatarsal was identified.  It was cleared of all soft tissue and removed in its entirety.  AP and lateral radiographs showed complete removal of all of the midfoot hardware.  Wounds were irrigated and closed with nylon. Sterile dressings were applied, followed by compression wrap. Tourniquet was released after application of the dressings at 26 minutes.  The patient was awakened from anesthesia and transported to the recovery room in stable condition.  FOLLOWUP PLAN:  The patient  will be weightbearing as tolerated on the left foot and a postop shoe.  She will follow up with me in the office in 2 weeks for suture removal.  X-RAYS:  AP and lateral radiographs of the left foot were obtained intraoperatively.  These show interval removal of all metallic hardware from the midfoot.  Fusion at the first and second TMT joints is noted. No acute injuries are noted.     [name], MD     JH/MEDQ  D:  10/01/2016  T:  10/01/2016  Job:  [id number]

## 2016-10-01 NOTE — H&P (Signed)
Janice Wolf is an 36 y.o. female.   Chief Complaint: left foot pain HPI: 36 y/o female with left foot painful hardware after TMT joint arthrodesis.  She has failed non op treatment and presents today for removal of the deep implants.  Past Medical History:  Diagnosis Date  . Abnormal thyroid blood test    no treatment yet, per pt.  . Complication of anesthesia    was hard to wake up after T & A 12/2015; had to stay overnight  . Dental crowns present   . GERD (gastroesophageal reflux disease)   . Hypothyroidism   . Migraines    migraines  . Painful orthopaedic hardware ([place]) 09/2016   left foot  . PONV (postoperative nausea and vomiting)   . Stuffy nose 09/03/2016    Past Surgical History:  Procedure Laterality Date  . CHOLECYSTECTOMY     age 26  . TARSAL METATARSAL ARTHRODESIS Left 02/20/2016   Procedure: FIRST AND SECOND TARSAL METATARSAL ARTHRODESIS;  Surgeon: [name], MD;  Location: [facility];  Service: Orthopedics;  Laterality: Left;  . TONSILLECTOMY AND ADENOIDECTOMY  12/2015    History reviewed. No pertinent family history. Social History:  reports that she has never smoked. She has never used smokeless tobacco. She reports that she does not drink alcohol or use drugs.  Allergies:  Allergies  Allergen Reactions  . Aspirin Nausea And Vomiting  . Latex Other (See Comments)    SKIN IRRITATION  . Other Rash    PERFUMES    Medications Prior to Admission  Medication Sig Dispense Refill  . esomeprazole (NEXIUM) 20 MG capsule Take 20 mg by mouth daily at 12 noon.    [name] levonorgestrel (MIRENA) 20 MCG/24HR IUD 1 each by Intrauterine route once.    [name] levothyroxine (SYNTHROID, LEVOTHROID) 25 MCG tablet Take 50 mcg by mouth daily before breakfast.     . ibuprofen (ADVIL,MOTRIN) 200 MG tablet Take 200 mg by mouth every 6 (six) hours as needed.      No results found for this or any previous visit (from the past 48 hour(s)). No results found.  ROS   No recent f/c/n/v/wt loss  Blood pressure 107/71, pulse 75, temperature 98.3 F (36.8 C), temperature source Oral, resp. rate 20, height 5\' 7"  (1.702 m), weight (!) 138.3 kg (305 lb), last menstrual period 07/25/2006, SpO2 97 %. Physical Exam  wn wd female in nad.  A and o x 4.  Mood and affect normal.  EOMI.  resp unlabored.  L foot with healed incisions.  No signs of infection.  Skin o/w healthy and intact.  No lymphadenopathy.  5/5 strength in PF and DF of the ankle and toes.  Sens to LT intact dorsally and plantarly at the foot.  Assessment/Plan L foot painful hardware - to OR for removal of deep implants.  The risks and benefits of the alternative treatment options have been discussed in detail.  The patient wishes to proceed with surgery and specifically understands risks of bleeding, infection, nerve damage, blood clots, need for additional surgery, amputation and death.   [name], MD 10-26-2016, 11:46 AM

## 2016-10-01 NOTE — Anesthesia Procedure Notes (Signed)
Procedure Name: LMA Insertion Date/Time: 10/01/2016 12:08 PM Performed by: Jlynn Ly D Pre-anesthesia Checklist: Patient identified, Emergency Drugs available, Suction available and Patient being monitored Patient Re-evaluated:Patient Re-evaluated prior to inductionOxygen Delivery Method: Circle system utilized Preoxygenation: Pre-oxygenation with 100% oxygen Intubation Type: IV induction Ventilation: Mask ventilation without difficulty LMA: LMA inserted LMA Size: 4.0 Number of attempts: 1 Airway Equipment and Method: Bite block Placement Confirmation: positive ETCO2 Tube secured with: Tape Dental Injury: Teeth and Oropharynx as per pre-operative assessment

## 2016-10-01 NOTE — Anesthesia Preprocedure Evaluation (Addendum)
Anesthesia Evaluation  Patient identified by MRN, date of birth, ID band Patient awake    Reviewed: Allergy & Precautions, NPO status , Patient's Chart, lab work & pertinent test results  History of Anesthesia Complications (+) PONV and history of anesthetic complications  Airway Mallampati: II  TM Distance: >3 FB Neck ROM: Full    Dental  (+) Teeth Intact, Dental Advisory Given, Caps   Pulmonary neg pulmonary ROS,    Pulmonary exam normal breath sounds clear to auscultation       Cardiovascular negative cardio ROS Normal cardiovascular exam Rhythm:Regular Rate:Normal     Neuro/Psych  Headaches, negative psych ROS   GI/Hepatic Neg liver ROS, GERD  Medicated,  Endo/Other  Hypothyroidism Morbid obesity  Renal/GU negative Renal ROS     Musculoskeletal negative musculoskeletal ROS (+)   Abdominal   Peds  Hematology negative hematology ROS (+)   Anesthesia Other Findings Day of surgery medications reviewed with the patient.  Reproductive/Obstetrics                            Anesthesia Physical Anesthesia Plan  ASA: III  Anesthesia Plan: General   Post-op Pain Management:    Induction: Intravenous  Airway Management Planned: LMA  Additional Equipment:   Intra-op Plan:   Post-operative Plan: Extubation in OR  Informed Consent: I have reviewed the patients History and Physical, chart, labs and discussed the procedure including the risks, benefits and alternatives for the proposed anesthesia with the patient or authorized representative who has indicated his/her understanding and acceptance.   Dental advisory given  Plan Discussed with: CRNA  Anesthesia Plan Comments: (Risks/benefits of general anesthesia discussed with patient including risk of damage to teeth, lips, gum, and tongue, nausea/vomiting, allergic reactions to medications, and the possibility of heart attack, stroke  and death.  All patient questions answered.  Patient wishes to proceed.)        Anesthesia Quick Evaluation

## 2016-10-02 ENCOUNTER — Encounter (HOSPITAL_BASED_OUTPATIENT_CLINIC_OR_DEPARTMENT_OTHER): Payer: Self-pay | Admitting: Orthopedic Surgery

## 2016-10-02 NOTE — Anesthesia Postprocedure Evaluation (Signed)
Anesthesia Post Note  Patient: Janice Wolf  Procedure(s) Performed: Procedure(s) (LRB): HARDWARE REMOVAL left foot (Left)     Patient location during evaluation: PACU Anesthesia Type: General Level of consciousness: awake and alert Pain management: pain level controlled Vital Signs Assessment: post-procedure vital signs reviewed and stable Respiratory status: spontaneous breathing, nonlabored ventilation, respiratory function stable and patient connected to nasal cannula oxygen Cardiovascular status: blood pressure returned to baseline and stable Postop Assessment: no signs of nausea or vomiting Anesthetic complications: no    Last Vitals:  Vitals:   10/01/16 1400 10/01/16 1435  BP: 118/80 119/78  Pulse: 73 62  Resp: 18 20  Temp:  36.6 C    Last Pain:  Vitals:   10/01/16 1435  TempSrc: Oral  PainSc: [place]

## 2016-11-09 DIAGNOSIS — E039 Hypothyroidism, unspecified: Secondary | ICD-10-CM | POA: Diagnosis not present

## 2017-02-19 DIAGNOSIS — E039 Hypothyroidism, unspecified: Secondary | ICD-10-CM | POA: Diagnosis not present

## 2017-04-09 DIAGNOSIS — Z0000 Encounter for general adult medical examination without abnormal findings: Secondary | ICD-10-CM | POA: Diagnosis not present

## 2017-04-09 DIAGNOSIS — E559 Vitamin D deficiency, unspecified: Secondary | ICD-10-CM | POA: Diagnosis not present

## 2017-04-09 DIAGNOSIS — R740 Nonspecific elevation of levels of transaminase and lactic acid dehydrogenase [LDH]: Secondary | ICD-10-CM | POA: Diagnosis not present

## 2017-04-09 DIAGNOSIS — Z79899 Other long term (current) drug therapy: Secondary | ICD-10-CM | POA: Diagnosis not present

## 2017-05-27 DIAGNOSIS — R740 Nonspecific elevation of levels of transaminase and lactic acid dehydrogenase [LDH]: Secondary | ICD-10-CM | POA: Diagnosis not present

## 2017-06-03 DIAGNOSIS — J111 Influenza due to unidentified influenza virus with other respiratory manifestations: Secondary | ICD-10-CM | POA: Diagnosis not present

## 2017-08-10 DIAGNOSIS — E039 Hypothyroidism, unspecified: Secondary | ICD-10-CM | POA: Diagnosis not present

## 2018-01-06 DIAGNOSIS — Z6841 Body Mass Index (BMI) 40.0 and over, adult: Secondary | ICD-10-CM | POA: Diagnosis not present

## 2018-01-06 DIAGNOSIS — Z01419 Encounter for gynecological examination (general) (routine) without abnormal findings: Secondary | ICD-10-CM | POA: Diagnosis not present

## 2018-02-03 IMAGING — CR DG FOOT COMPLETE 3+V*L*
3 series · 3 of 3 positions shown · non-contrast
Comparison: None.

CLINICAL DATA: Fall today.  Pain along dorsum of foot

EXAM:
LEFT FOOT - COMPLETE 3+ VIEW

[view not recorded (1 of 3)]
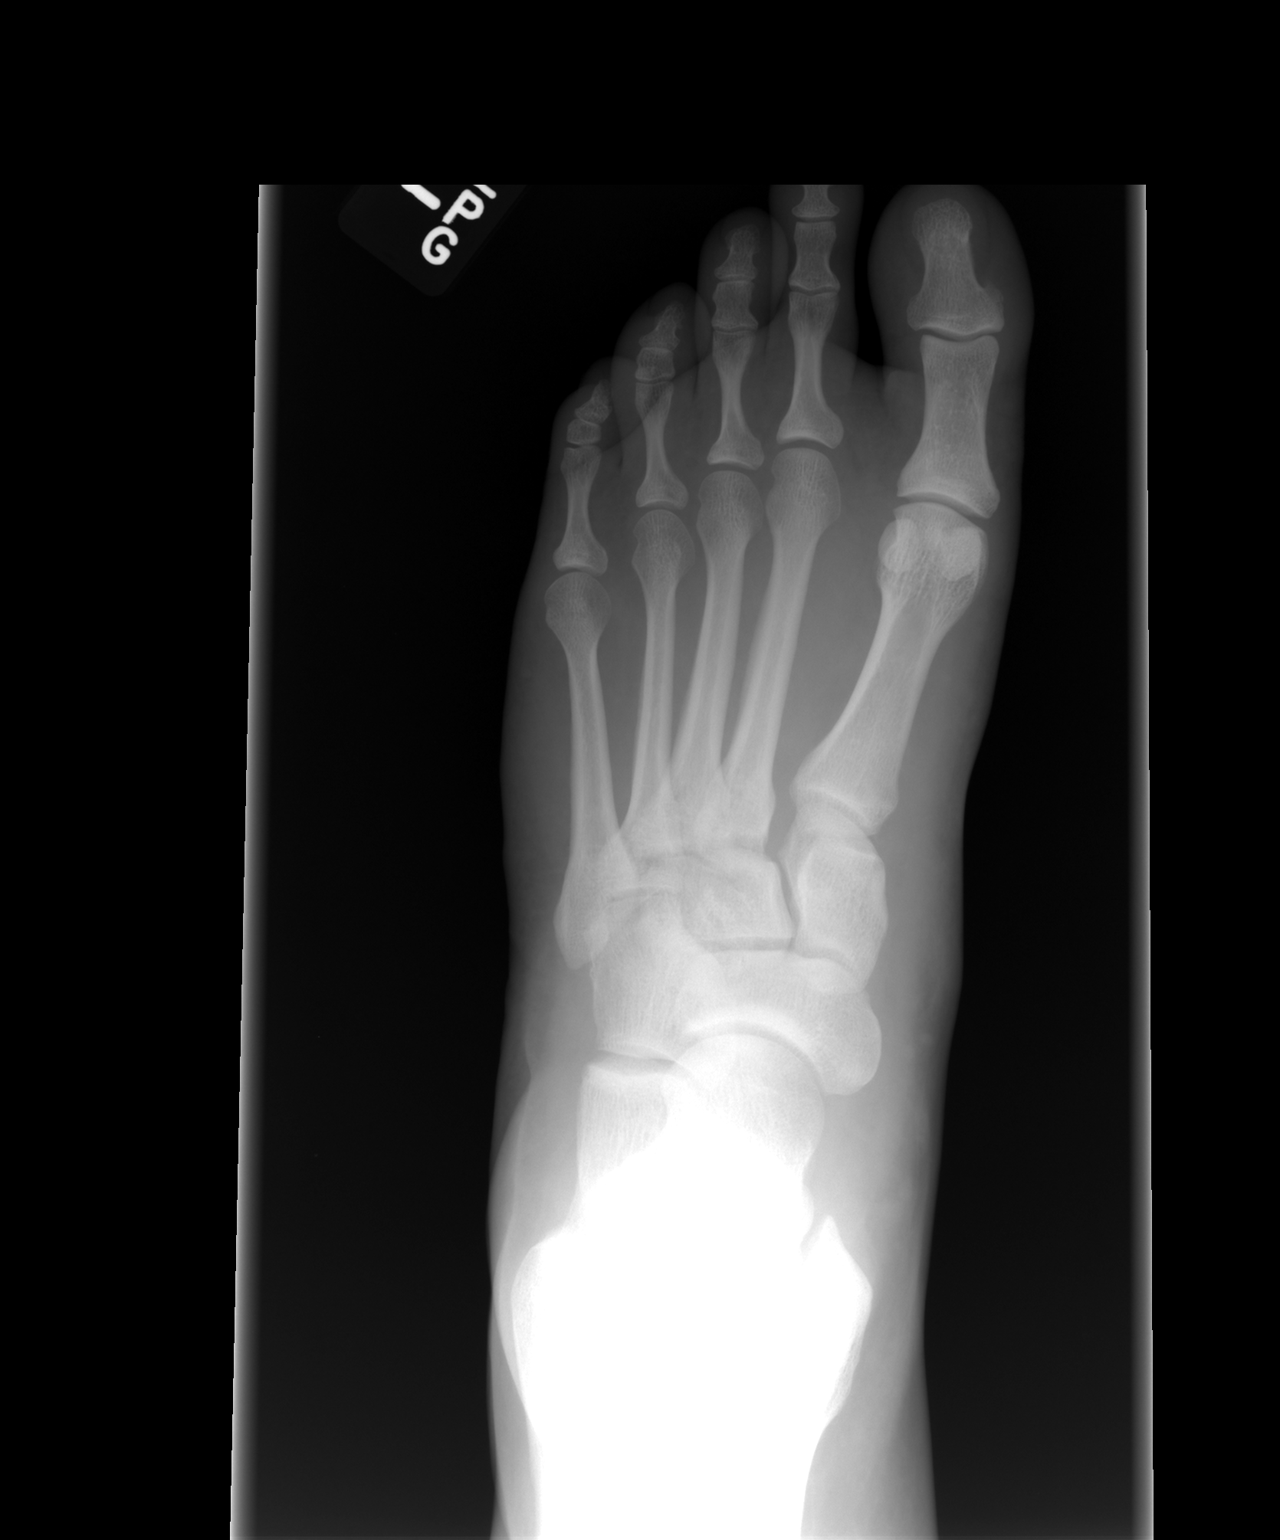

[view not recorded (2 of 3)]
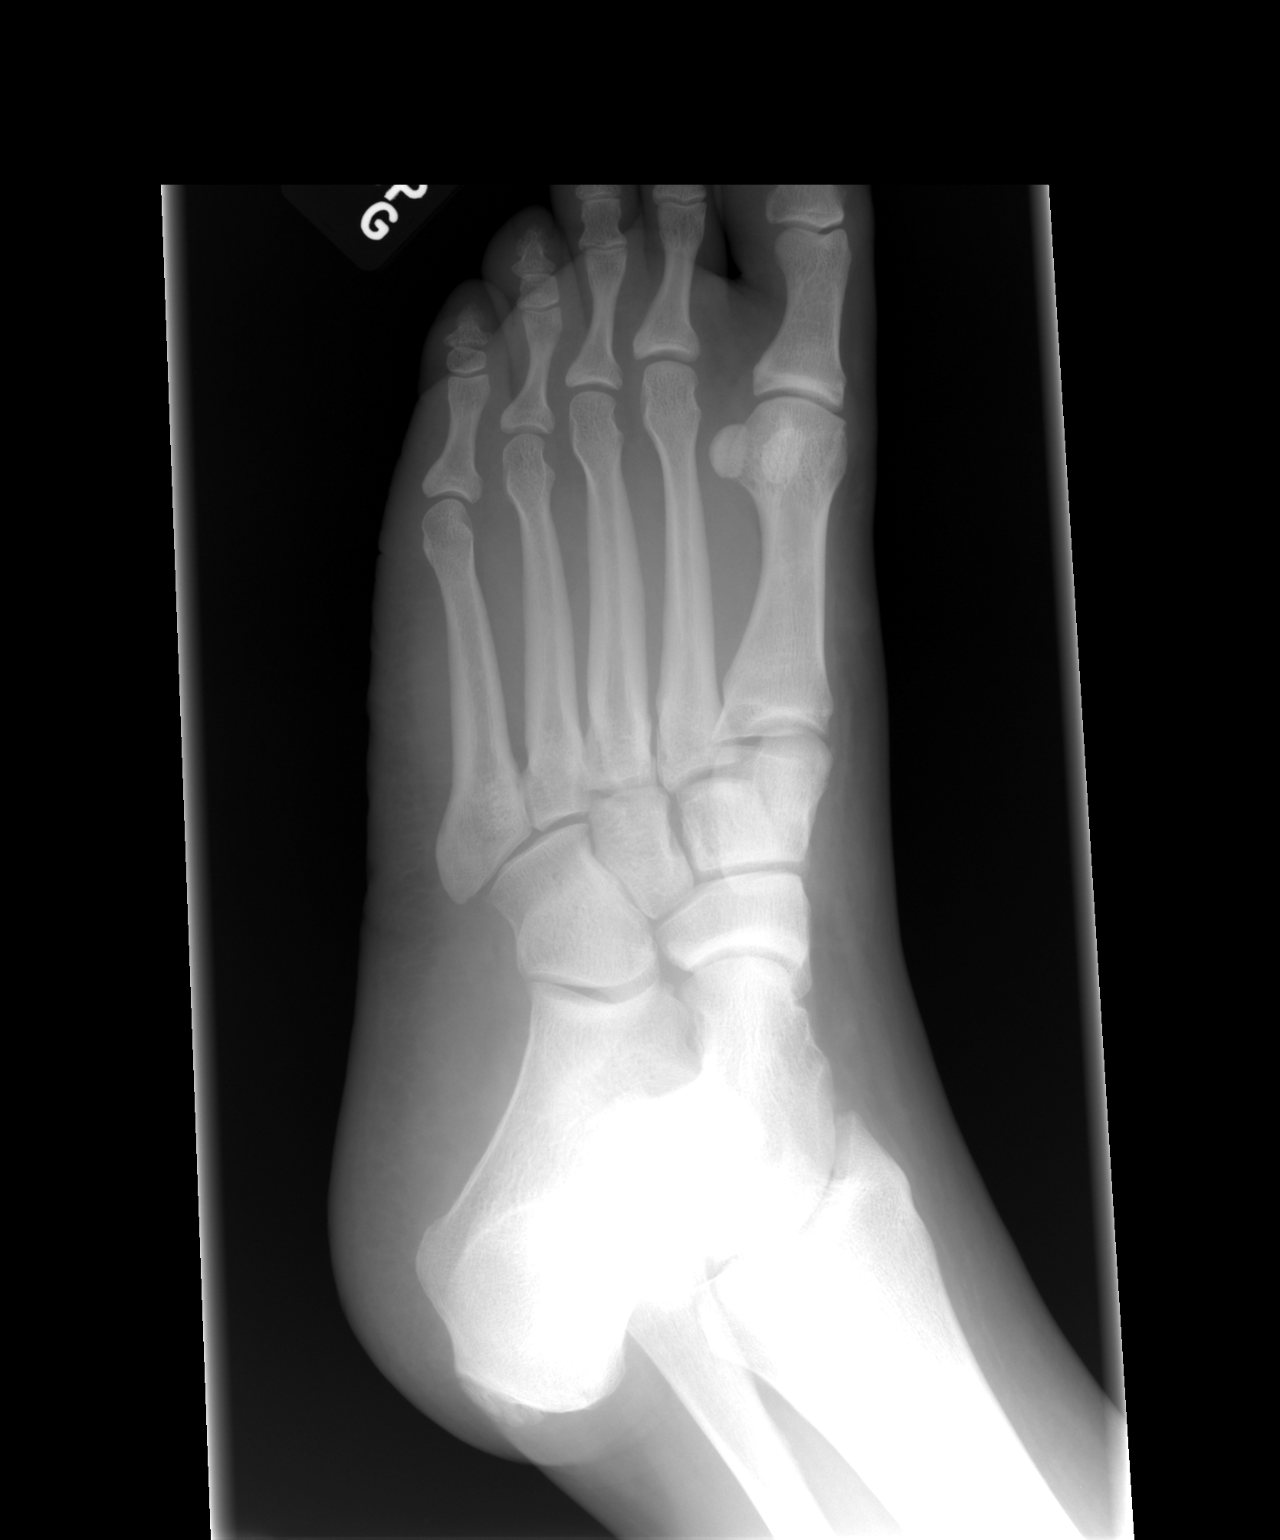

[view not recorded (3 of 3)]
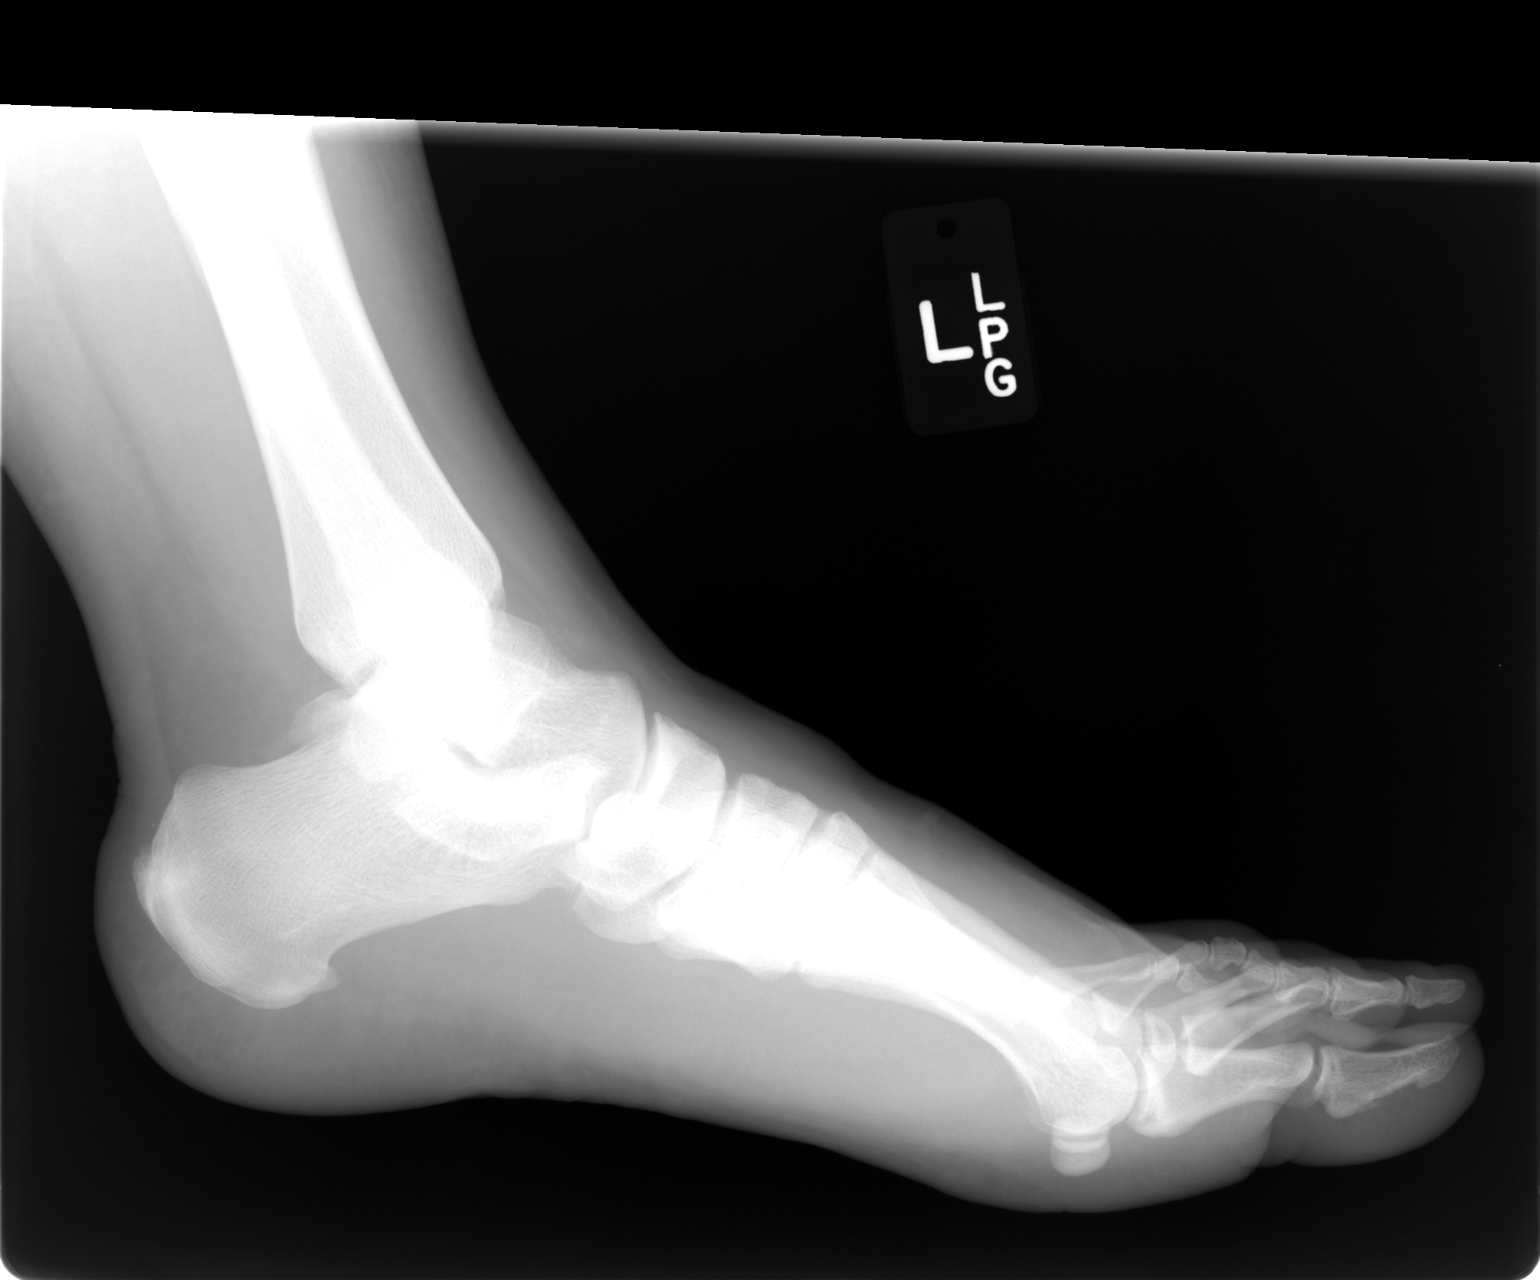

[3 of 3 positions shown; findings below may reference images not displayed]

FINDINGS: There is no evidence of fracture or dislocation. There is no
evidence of arthropathy or other focal bone abnormality. Soft
tissues are unremarkable.
IMPRESSION: Negative.

## 2018-02-10 IMAGING — CR DG FOOT COMPLETE 3+V*L*
3 series · 3 of 3 positions shown · non-contrast
Comparison: October 22, 25

CLINICAL DATA: Persistent pain in the dorsum of the left foot.

EXAM:
LEFT FOOT - COMPLETE 3+ VIEW

[view not recorded (1 of 3)]
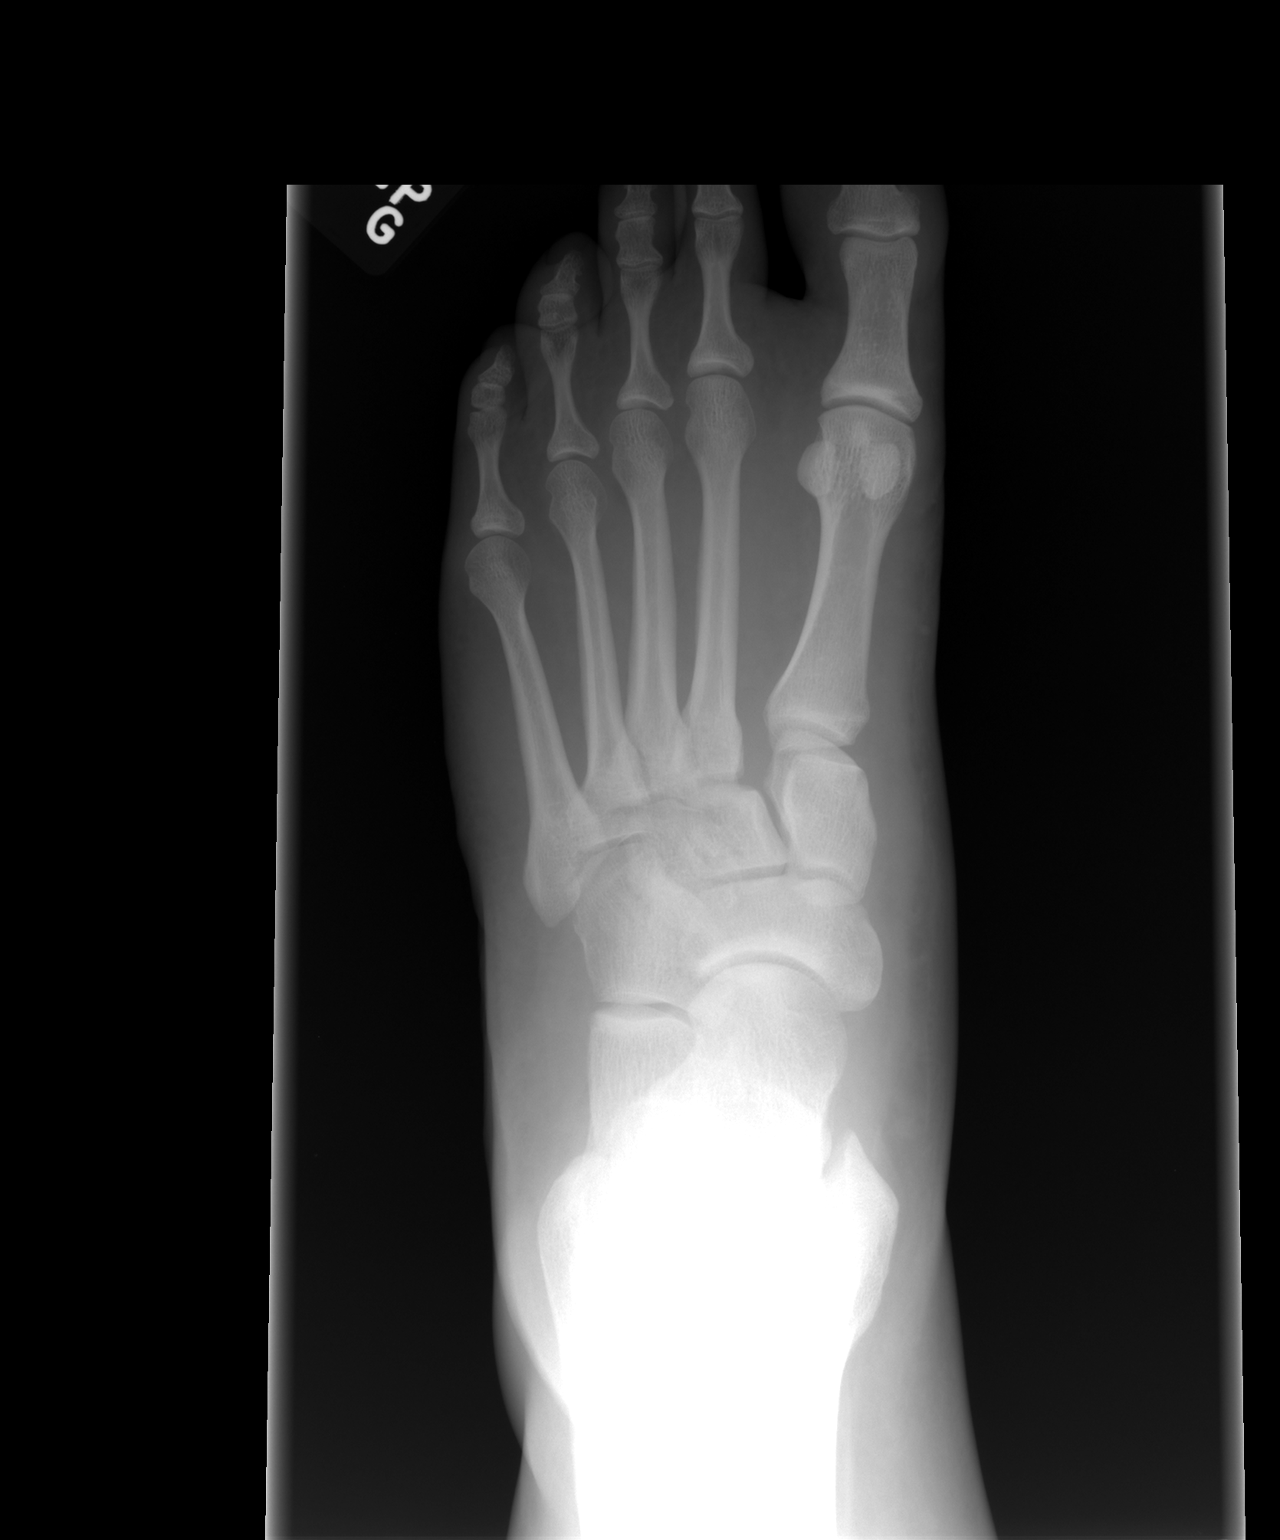

[view not recorded (2 of 3)]
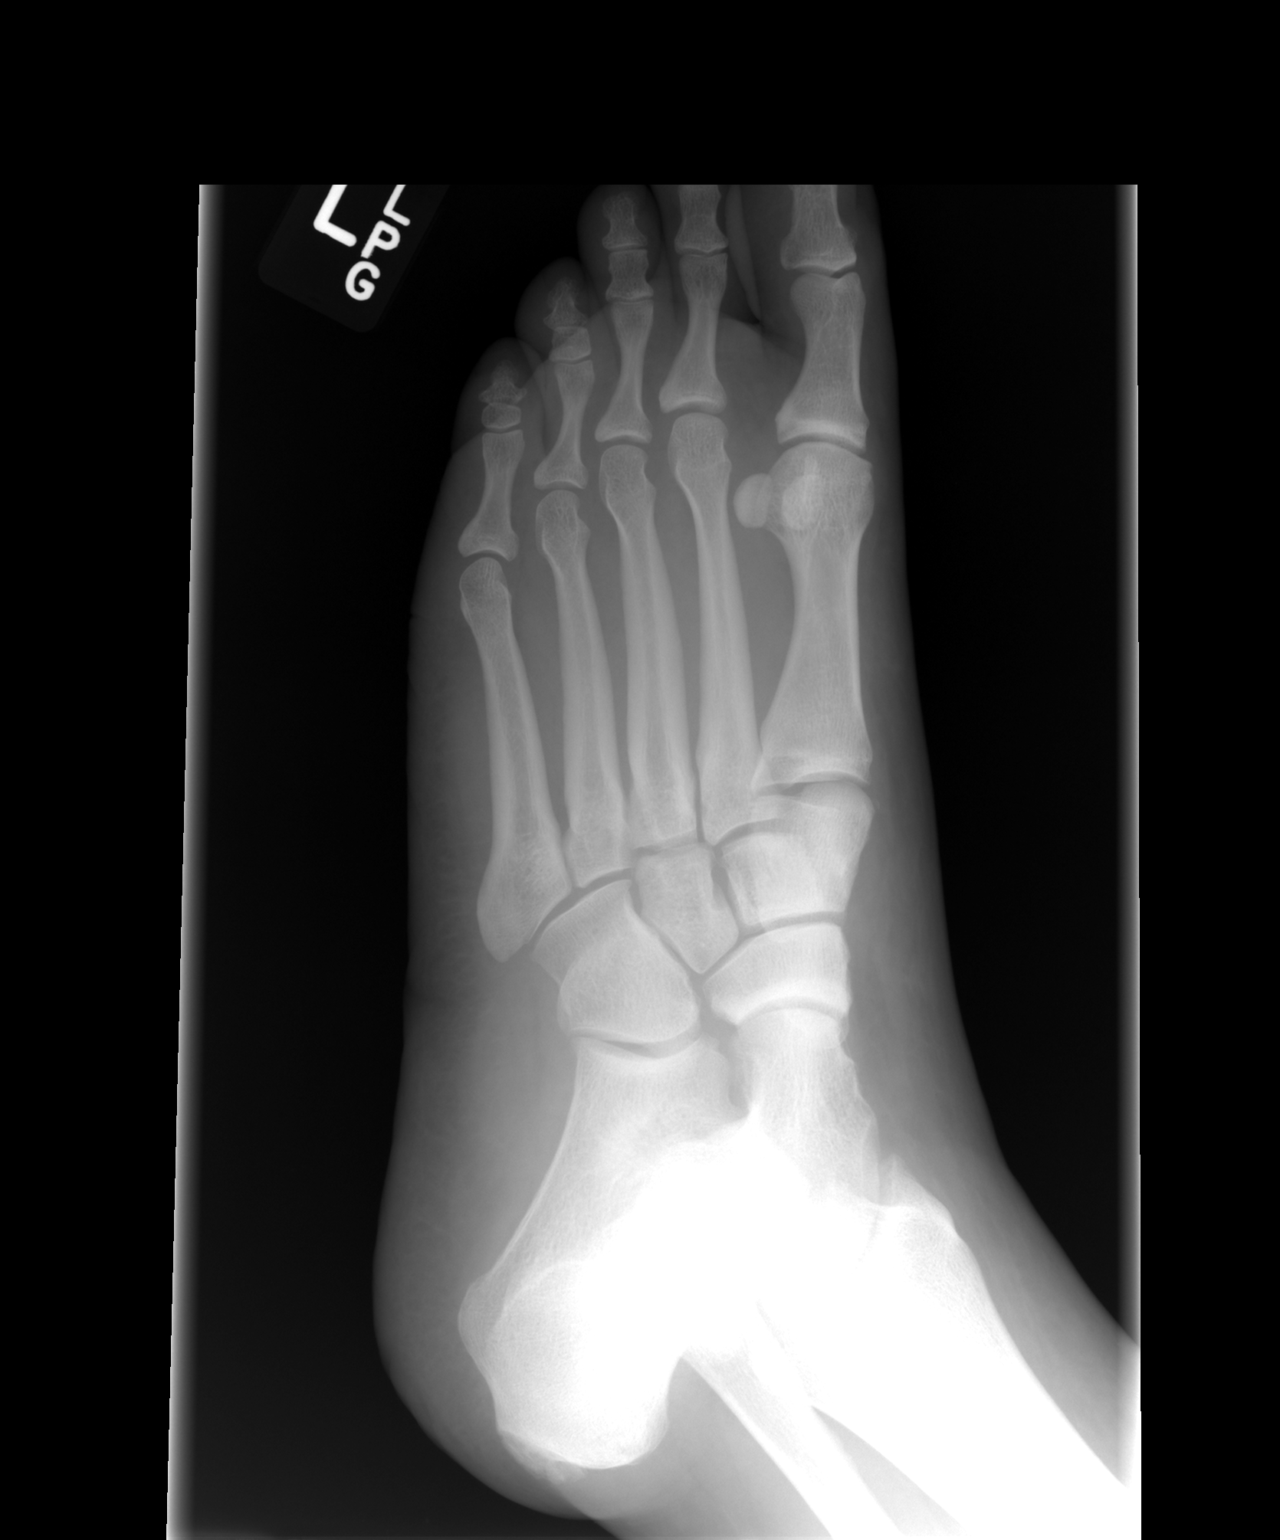

[view not recorded (3 of 3)]
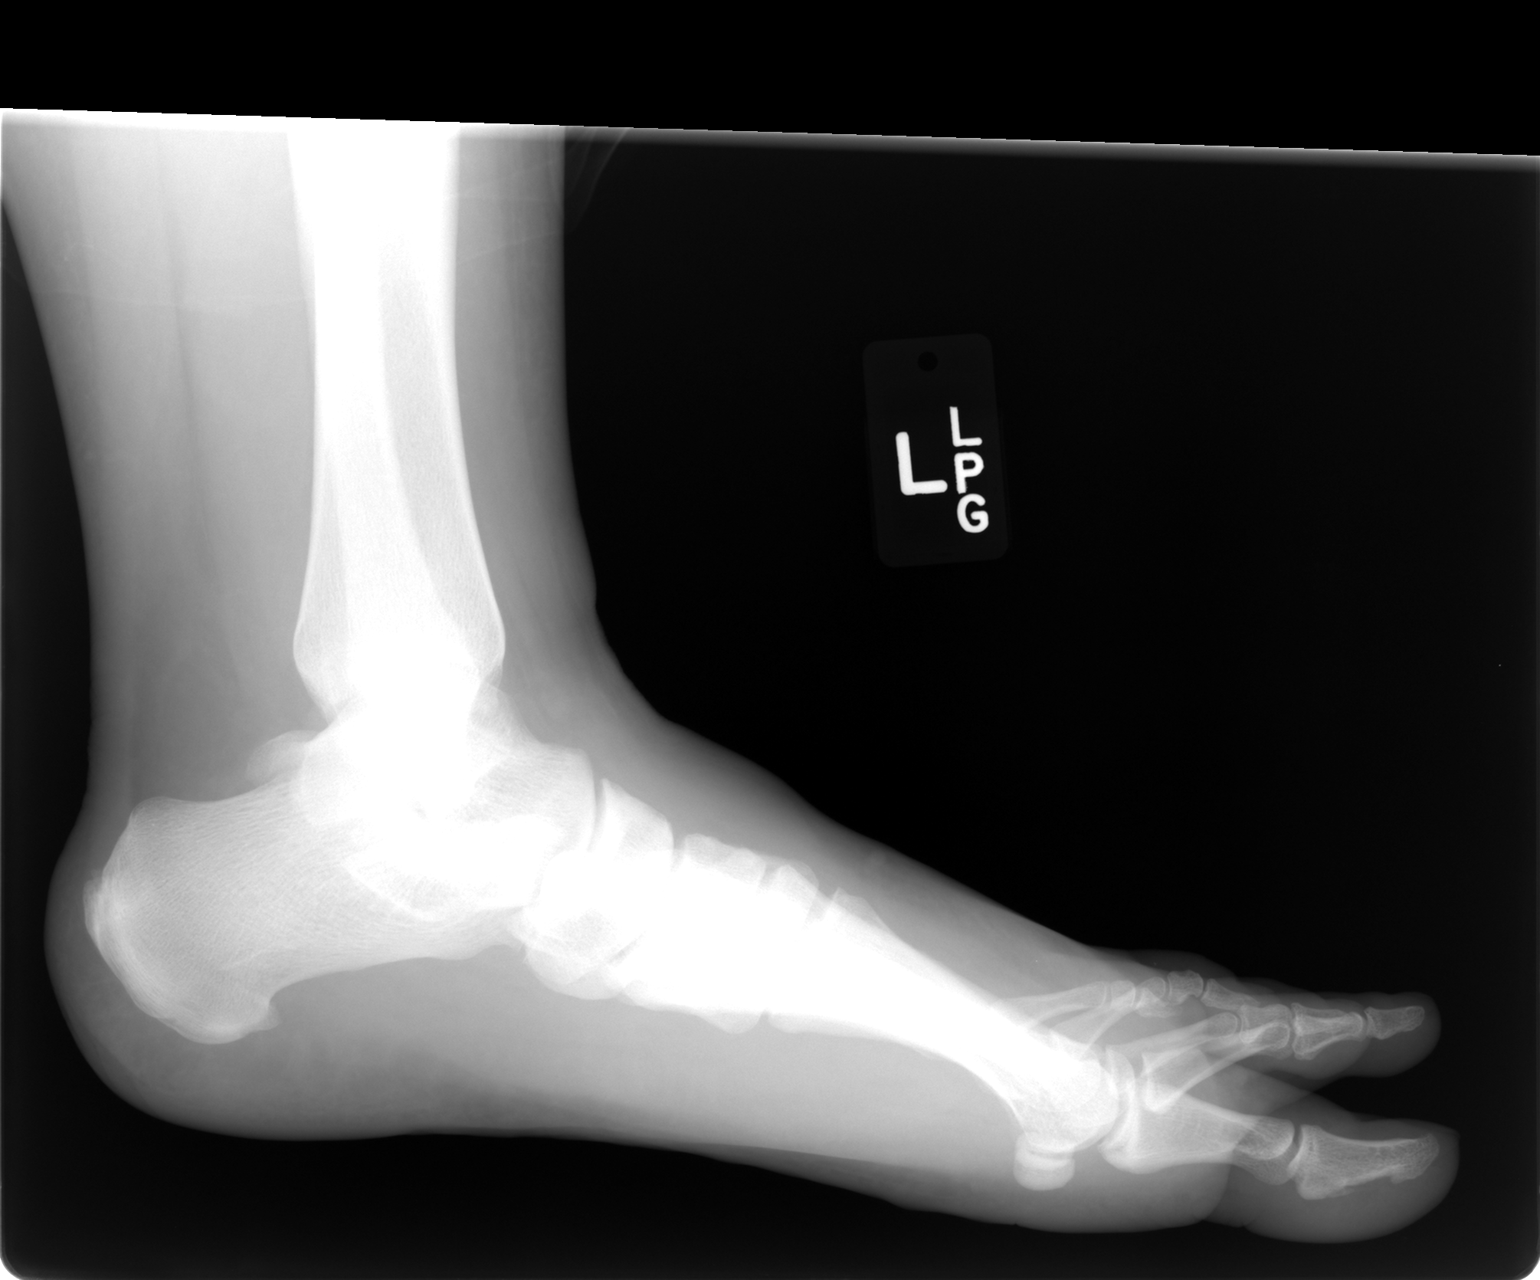

[3 of 3 positions shown; findings below may reference images not displayed]

FINDINGS: There is no evidence of fracture or dislocation. There is no
evidence of arthropathy or other focal bone abnormality. Soft
tissues are unremarkable.
IMPRESSION: Negative.

## 2018-02-25 DIAGNOSIS — H6693 Otitis media, unspecified, bilateral: Secondary | ICD-10-CM | POA: Diagnosis not present

## 2018-02-25 DIAGNOSIS — J0100 Acute maxillary sinusitis, unspecified: Secondary | ICD-10-CM | POA: Diagnosis not present

## 2018-04-18 DIAGNOSIS — Z0000 Encounter for general adult medical examination without abnormal findings: Secondary | ICD-10-CM | POA: Diagnosis not present

## 2018-05-02 DIAGNOSIS — H5203 Hypermetropia, bilateral: Secondary | ICD-10-CM | POA: Diagnosis not present

## 2018-06-30 ENCOUNTER — Encounter (INDEPENDENT_AMBULATORY_CARE_PROVIDER_SITE_OTHER): Payer: Self-pay

## 2018-07-14 ENCOUNTER — Ambulatory Visit (INDEPENDENT_AMBULATORY_CARE_PROVIDER_SITE_OTHER): Payer: 59 | Admitting: Family Medicine

## 2018-07-14 ENCOUNTER — Other Ambulatory Visit: Payer: Self-pay

## 2018-07-14 ENCOUNTER — Ambulatory Visit (INDEPENDENT_AMBULATORY_CARE_PROVIDER_SITE_OTHER): Payer: Self-pay | Admitting: Bariatrics

## 2018-07-14 ENCOUNTER — Encounter (INDEPENDENT_AMBULATORY_CARE_PROVIDER_SITE_OTHER): Payer: Self-pay | Admitting: Family Medicine

## 2018-07-14 VITALS — BP 115/80 | HR 68 | Temp 98.0°F | Ht 67.0 in | Wt 304.0 lb

## 2018-07-14 DIAGNOSIS — E7849 Other hyperlipidemia: Secondary | ICD-10-CM

## 2018-07-14 DIAGNOSIS — Z9189 Other specified personal risk factors, not elsewhere classified: Secondary | ICD-10-CM | POA: Diagnosis not present

## 2018-07-14 DIAGNOSIS — Z6841 Body Mass Index (BMI) 40.0 and over, adult: Secondary | ICD-10-CM

## 2018-07-14 DIAGNOSIS — F3289 Other specified depressive episodes: Secondary | ICD-10-CM | POA: Diagnosis not present

## 2018-07-14 DIAGNOSIS — E038 Other specified hypothyroidism: Secondary | ICD-10-CM

## 2018-07-14 DIAGNOSIS — R0602 Shortness of breath: Secondary | ICD-10-CM | POA: Diagnosis not present

## 2018-07-14 DIAGNOSIS — R5383 Other fatigue: Secondary | ICD-10-CM | POA: Diagnosis not present

## 2018-07-14 DIAGNOSIS — E559 Vitamin D deficiency, unspecified: Secondary | ICD-10-CM

## 2018-07-14 DIAGNOSIS — Z0289 Encounter for other administrative examinations: Secondary | ICD-10-CM

## 2018-07-14 NOTE — Progress Notes (Signed)
.  Office: [phone]  /  Fax: [phone]   HPI:   Chief Complaint: OBESITY  Janice Wolf (MR# [id number]) is a 38 y.o. female who presents on 07/14/2018 for obesity evaluation and treatment. Current BMI is Body mass index is 47.61 kg/m.Janice Wolf has struggled with obesity for years and has been unsuccessful in either losing weight or maintaining long term weight loss. Janice Wolf attended our information session and states she is currently in the action stage of change and ready to dedicate time achieving and maintaining a healthier weight.   Janice Wolf heard about our clinic from her husband.  Janice Wolf states her family eats meals together she thinks her family will eat healthier with  her her desired weight loss is 104 lbs she has been heavy most of  her life she started gaining weight in the 1st grade, summer of 1989/1990. her heaviest weight ever was 340 lbs she is a picky eater and doesn't like to eat healthier foods  she skips meals frequently she is frequently drinking liquids with calories she frequently makes poor food choices she frequently eats larger portions than normal  she struggles with emotional eating    Fatigue Janice Wolf feels her energy is lower than it should be. This has worsened with weight gain and has not worsened recently. Janice Wolf admits to daytime somnolence and  admits to waking up still tired. Patient is at risk for obstructive sleep apnea. Janice Wolf has a history of symptoms of daytime fatigue and morning headache. Patient generally gets 9 hours of sleep per night, and states they generally have nightime awakenings. Snoring is present. Apneic episodes are not present. Epworth Sleepiness Score is 8.  Dyspnea on exertion Janice Wolf notes increasing shortness of breath with exercising and seems to be worsening over time with weight gain. She notes getting out of breath sooner with activity than she used to. This has not gotten worse recently. EKG was not done  today. Nyhla denies orthopnea.  Hypothyroidism Janice Wolf has a diagnosis of hypothyroidism. Her last TSH level was 2.21 and T4 of 1.10. She is stable on levothyroxine 100 mcg. She denies hot or cold intolerance or palpitations, but does admit to ongoing fatigue.  Vitamin D Deficiency Janice Wolf has a diagnosis of vitamin D deficiency. She was given 1 dose of elevated dosage of Vit D. Last Vit D level was 11.8. She denies nausea, vomiting or muscle weakness.  At risk for osteopenia and osteoporosis Janice Wolf is at higher risk of osteopenia and osteoporosis due to vitamin D deficiency.   Hyperlipidemia Janice Wolf has hyperlipidemia and last LDL was 122, HDL of 45, and triglycerides of 93, done on 04/21/18. She is not on medication and notes a significant family history of hyperlipidemia. She would like to try to improve her cholesterol levels with intensive lifestyle modification including a low saturated fat diet, exercise and weight loss. She denies any chest pain, claudication or myalgias.  Depression with emotional eating behaviors Janice Wolf has symptoms but she voices she does not feel as though she currently needs medicine for treatment. Janice Wolf is struggling with emotional eating and using food for comfort to the extent that it is negatively impacting her health. She often snacks when she is not hungry. Janice Wolf sometimes feels she is out of control and then feels guilty that she made poor food choices. She has been working on behavior modification techniques to help reduce her emotional eating and has been somewhat successful. She shows no sign of suicidal or homicidal ideations.  Depression Screen  Janice Wolf's Food and Mood (modified PHQ-9) score was  Depression screen PHQ 2/9 07/14/2018  Decreased Interest 3  Down, Depressed, Hopeless 3  PHQ - 2 Score 6  Altered sleeping 3  Tired, decreased energy 2  Change in appetite 2  Feeling bad or failure about yourself  1  Trouble concentrating 3   Moving slowly or fidgety/restless 0  Suicidal thoughts 0  PHQ-9 Score 17  Difficult doing work/chores Not difficult at all    ASSESSMENT AND PLAN:  Other fatigue - Plan: Hemoglobin A1c, Insulin, random, Vitamin B12, Folate  SOB (shortness of breath) on exertion - Plan: Vitamin B12, Folate  Other specified hypothyroidism  Vitamin D deficiency  Other hyperlipidemia - Plan: Hemoglobin A1c, Insulin, random  Other depression - with emotional eating   At risk for osteoporosis  Class 3 severe obesity with serious comorbidity and body mass index (BMI) of 45.0 to 49.9 in adult, unspecified obesity type (HCC)  PLAN:  Fatigue Janice Wolf was informed that her fatigue may be related to obesity, depression or many other causes. Labs will be ordered, and in the meanwhile Janice Wolf has agreed to work on diet, exercise and weight loss to help with fatigue. Proper sleep hygiene was discussed including the need for 7-8 hours of quality sleep each night. A sleep study was not ordered based on symptoms and Epworth score.  Dyspnea on exertion Janice Wolf's shortness of breath appears to be obesity related and exercise induced. She has agreed to work on weight loss and gradually increase exercise to treat her exercise induced shortness of breath. If Janice Wolf follows our instructions and loses weight without improvement of her shortness of breath, we will plan to refer to pulmonology. We will monitor this condition regularly. Janice Wolf agrees to this plan.  Hypothyroidism Janice Wolf was informed of the importance of good thyroid control to help with weight loss efforts. She was also informed that supertheraputic thyroid levels are dangerous and will not improve weight loss results. Janice Wolf agrees to continue her current levothyroxine dose, and she agrees to follow up with our clinic in 2 weeks.  Vitamin D Deficiency Janice Wolf was informed that low vitamin D levels contributes to fatigue and are associated with  obesity, breast, and colon cancer. Janice Wolf agrees to start prescription Vit D @50 ,000 IU every week #4 with no refills. She will follow up for routine testing of vitamin D, at least 2-3 times per year. She was informed of the risk of over-replacement of vitamin D and agrees to not increase her dose unless she discusses this with [place] first. Shawnte agrees to follow up with our clinic in 2 weeks.  At risk for osteopenia and osteoporosis Janice Wolf was given extended (15 minutes) osteoporosis prevention counseling today. Janice Wolf is at risk for osteopenia and osteoporsis due to her vitamin D deficiency. She was encouraged to take her vitamin D and follow her higher calcium diet and increase strengthening exercise to help strengthen her bones and decrease her risk of osteopenia and osteoporosis.  Hyperlipidemia Janice Wolf was informed of the American Heart Association Guidelines emphasizing intensive lifestyle modifications as the first line treatment for hyperlipidemia. We discussed many lifestyle modifications today in depth, and Krystianna will continue to work on decreasing saturated fats such as fatty red meat, butter and many fried foods. She will also increase vegetables and lean protein in her diet and continue to work on exercise and weight loss efforts. We will repeat FLP in 3 months. Janice Wolf agrees to follow up with our clinic in 2 weeks.  Depression with Emotional Eating Behaviors We discussed behavior modification techniques today to help Janice Wolf deal with her emotional eating and depression. We will follow up at next appointment. Janice Wolf agrees to follow up with our clinic in 2 weeks.  Depression Screen Janice Wolf had a strongly positive depression screening. Depression is commonly associated with obesity and often results in emotional eating behaviors. We will monitor this closely and work on CBT to help improve the non-hunger eating patterns. Referral to Psychology may be required if no  improvement is seen as she continues in our clinic.  Obesity Janice Wolf is currently in the action stage of change and her goal is to continue with weight loss efforts She has agreed to follow the Category 3 plan with Category 4 microwave meal options Janice Wolf has been instructed to work up to a goal of 150 minutes of combined cardio and strengthening exercise per week for weight loss and overall health benefits. We discussed the following Behavioral Modification Strategies today: increasing lean protein intake, increasing vegetables, work on meal planning and easy cooking plans, better snacking choices, or planning for success  Janice Wolf has agreed to follow up with our clinic in 2 weeks. She was informed of the importance of frequent follow up visits to maximize her success with intensive lifestyle modifications for her multiple health conditions. She was informed we would discuss her lab results at her next visit unless there is a critical issue that needs to be addressed sooner. Michaele agreed to keep her next visit at the agreed upon time to discuss these results.  ALLERGIES: Allergies  Allergen Reactions  . Aspirin Nausea And Vomiting  . Latex Other (See Comments)    SKIN IRRITATION  . Other Rash    PERFUMES    MEDICATIONS: Current Outpatient Medications on File Prior to Visit  Medication Sig Dispense Refill  . esomeprazole (NEXIUM) 20 MG capsule Take 20 mg by mouth daily at 12 noon.    [name] levonorgestrel (MIRENA) 20 MCG/24HR IUD 1 each by Intrauterine route once.    [name] levothyroxine (SYNTHROID, LEVOTHROID) 25 MCG tablet Take 50 mcg by mouth daily before breakfast.     . Multiple Vitamins-Minerals (MULTIVITAMIN WITH MINERALS) tablet Take 1 tablet by mouth daily.     No current facility-administered medications on file prior to visit.     PAST MEDICAL HISTORY: Past Medical History:  Diagnosis Date  . Abnormal thyroid blood test    no treatment yet, per pt.  . Anxiety   . Back  pain   . Complication of anesthesia    was hard to wake up after T & A 12/2015; had to stay overnight  . Dental crowns present   . Depression   . Dizziness   . Fatty liver   . Gallbladder problem   . GERD (gastroesophageal reflux disease)   . Hypothyroidism   . IBS (irritable bowel syndrome)   . Migraines    migraines  . Painful orthopaedic hardware ([place]) 09/2016   left foot  . PONV (postoperative nausea and vomiting)   . Stuffy nose 09/03/2016  . Vitamin D deficiency     PAST SURGICAL HISTORY: Past Surgical History:  Procedure Laterality Date  . CHOLECYSTECTOMY     age 55  . HARDWARE REMOVAL Left 10/01/2016   Procedure: HARDWARE REMOVAL left foot;  Surgeon: [name], MD;  Location: [place];  Service: Orthopedics;  Laterality: Left;  . TARSAL METATARSAL ARTHRODESIS Left 02/20/2016   Procedure: FIRST AND SECOND TARSAL METATARSAL ARTHRODESIS;  Surgeon: [name], MD;  Location: [place];  Service: Orthopedics;  Laterality: Left;  . TONSILLECTOMY AND ADENOIDECTOMY  12/2015    SOCIAL HISTORY: Social History   Tobacco Use  . Smoking status: Never Smoker  . Smokeless tobacco: Never Used  Substance Use Topics  . Alcohol use: No  . Drug use: No    FAMILY HISTORY: Family History  Problem Relation Age of Onset  . Diabetes Mother   . Hypertension Mother   . Anxiety disorder Mother   . Obesity Father   . Hyperlipidemia Father   . Hypertension Father   . Diabetes Father     ROS: Review of Systems  Constitutional: Positive for malaise/fatigue. Negative for weight loss.  HENT: Positive for nosebleeds and sinus pain.        + Decreased hearing + Nasal stuffiness + Nasal discharge  Eyes: Positive for blurred vision, double vision and photophobia.       + Vision changes + Wear glasses or contacts + Floaters  Respiratory: Positive for shortness of breath (with exertion).   Cardiovascular: Negative for chest pain, palpitations and  claudication.       + Leg cramping + Very cold feet or hands  Gastrointestinal: Positive for constipation, diarrhea, heartburn and nausea. Negative for vomiting.  Musculoskeletal: Positive for back pain. Negative for myalgias.       Negative muscle weakness + Neck stiffness + Muscle or joint pain + Muscle stiffness  Skin: Positive for itching and rash.       + Dryness  Neurological: Positive for dizziness and headaches.  Endo/Heme/Allergies: Positive for polydipsia.       Negative hot/cold intolerance  Psychiatric/Behavioral: Positive for depression. Negative for suicidal ideas.       + Stress    PHYSICAL EXAM: Blood pressure 115/80, pulse 68, temperature 98 F (36.7 C), temperature source Oral, height 5\' 7"  (1.702 m), weight (!) 304 lb (137.9 kg), SpO2 98 %. Body mass index is 47.61 kg/m. Physical Exam Vitals signs reviewed.  Constitutional:      Appearance: Normal appearance. She is obese.  HENT:     Head: Normocephalic and atraumatic.     Nose: Nose normal.  Eyes:     General: No scleral icterus.    Extraocular Movements: Extraocular movements intact.  Neck:     Musculoskeletal: Normal range of motion and neck supple.     Comments: No thyromegaly present Cardiovascular:     Rate and Rhythm: Normal rate and regular rhythm.     Pulses: Normal pulses.     Heart sounds: Normal heart sounds.  Pulmonary:     Effort: Pulmonary effort is normal. No respiratory distress.     Breath sounds: Normal breath sounds.  Abdominal:     Palpations: Abdomen is soft.     Tenderness: There is no abdominal tenderness.     Comments: + Obesity  Musculoskeletal: Normal range of motion.     Right lower leg: No edema.     Left lower leg: No edema.  Skin:    General: Skin is warm and dry.  Neurological:     Mental Status: She is alert and oriented to person, place, and time.     Coordination: Coordination normal.  Psychiatric:        Mood and Affect: Mood normal.        Behavior:  Behavior normal.     RECENT LABS AND TESTS: BMET No results found for: NA, K, CL, CO2, GLUCOSE, BUN,  CREATININE, CALCIUM, GFRNONAA, GFRAA No results found for: HGBA1C No results found for: INSULIN CBC No results found for: WBC, RBC, HGB, HCT, PLT, MCV, MCH, MCHC, RDW, LYMPHSABS, MONOABS, EOSABS, BASOSABS Iron/TIBC/Ferritin/ %Sat No results found for: IRON, TIBC, FERRITIN, IRONPCTSAT Lipid Panel  No results found for: CHOL, TRIG, HDL, CHOLHDL, VLDL, LDLCALC, LDLDIRECT Hepatic Function Panel  No results found for: PROT, ALBUMIN, AST, ALT, ALKPHOS, BILITOT, BILIDIR, IBILI No results found for: TSH Vitamin D No recent labs  ECG  shows NSR with a rate of N/A INDIRECT CALORIMETER done today shows a VO2 of 305 and a REE of 2126. Her calculated basal metabolic rate is 5462 thus her basal metabolic rate is worse than expected.       OBESITY BEHAVIORAL INTERVENTION VISIT  Today's visit was # 1   Starting weight: 304 lbs Starting date: 07/14/2018 Today's weight : 304 lbs Today's date: 07/14/2018 Total lbs lost to date: 0    07/14/2018  Height 5\' 7"  (1.702 m)  Weight 304 lb (137.9 kg) (A)  BMI (Calculated) 47.6  BLOOD PRESSURE - SYSTOLIC 703  BLOOD PRESSURE - DIASTOLIC 80  Waist Measurement  58 inches   Body Fat % 52.2 %  Total Body Water (lbs) 105.8 lbs  RMR 2126     ASK: We discussed the diagnosis of obesity with Benito Mccreedy today and Janice Wolf agreed to give [place] permission to discuss obesity behavioral modification therapy today.  ASSESS: Kenadee has the diagnosis of obesity and her BMI today is 40.6 Tandrea is in the action stage of change   ADVISE: Olia was educated on the multiple health risks of obesity as well as the benefit of weight loss to improve her health. She was advised of the need for long term treatment and the importance of lifestyle modifications to improve her current health and to decrease her risk of future health problems.  AGREE:  Multiple dietary modification options and treatment options were discussed and  Shantea agreed to follow the recommendations documented in the above note.  ARRANGE: Allee was educated on the importance of frequent visits to treat obesity as outlined per CMS and USPSTF guidelines and agreed to schedule her next follow up appointment today.   I, Trixie Dredge, am acting as transcriptionist for Ilene Qua, MD    I have reviewed the above documentation for accuracy and completeness, and I agree with the above. - Ilene Qua, MD

## 2018-07-15 LAB — FOLATE: Folate: 7.6 ng/mL (ref >3.0)

## 2018-07-15 LAB — HEMOGLOBIN A1C
Est. average glucose Bld gHb Est-mCnc: 114 mg/dL
Hgb A1c MFr Bld: 5.6 % (ref 4.8–5.6)

## 2018-07-15 LAB — INSULIN, RANDOM: INSULIN: 35.5 u[IU]/mL — ABNORMAL HIGH (ref 2.6–24.9)

## 2018-07-15 LAB — VITAMIN B12: Vitamin B-12: 516 pg/mL (ref 232–1245)

## 2018-07-28 ENCOUNTER — Other Ambulatory Visit: Payer: Self-pay

## 2018-07-28 ENCOUNTER — Encounter (INDEPENDENT_AMBULATORY_CARE_PROVIDER_SITE_OTHER): Payer: Self-pay | Admitting: Family Medicine

## 2018-07-28 ENCOUNTER — Ambulatory Visit (INDEPENDENT_AMBULATORY_CARE_PROVIDER_SITE_OTHER): Payer: Self-pay | Admitting: Bariatrics

## 2018-07-28 ENCOUNTER — Ambulatory Visit (INDEPENDENT_AMBULATORY_CARE_PROVIDER_SITE_OTHER): Payer: 59 | Admitting: Family Medicine

## 2018-07-28 DIAGNOSIS — E8881 Metabolic syndrome: Secondary | ICD-10-CM

## 2018-07-28 DIAGNOSIS — E559 Vitamin D deficiency, unspecified: Secondary | ICD-10-CM

## 2018-07-28 DIAGNOSIS — Z6841 Body Mass Index (BMI) 40.0 and over, adult: Secondary | ICD-10-CM | POA: Diagnosis not present

## 2018-07-28 MED ORDER — VITAMIN D (ERGOCALCIFEROL) 1.25 MG (50000 UNIT) PO CAPS: 50000.0000 [IU] | ORAL_CAPSULE | ORAL | 0 refills | Status: DC

## 2018-08-01 NOTE — Progress Notes (Signed)
Office: [phone]  /  Fax: [phone] TeleHealth Visit:  Janice Wolf has consented to this TeleHealth visit today via telephone call. The patient is located at home, the provider is located at the [facility] office. The participants in this visit include the listed provider and patient, and provider's assistant and registered dietitian.   HPI:   Chief Complaint: OBESITY Janice Wolf is here to discuss her progress with her obesity treatment plan. She is on the Category 3 plan with Category 4 microwave meal options and is following her eating plan approximately 95 % of the time. She states she is walking 0.5 miles daily. Janice Wolf struggles with the quantity of the food on the meal plan. For breakfast she is doing either option. She is switching between 2 lunch options and feels satisfied most of the time. She is getting in 6 oz of meat at dinner. She is not getting all of the food in especially her snack. She would like to get different fruit in.  We were unable to weight the patient today for this TeleHealth visit. She feels as if she has maintained her weight since her last visit. She has lost 0 lbs since starting treatment with [place].  Vitamin D Deficiency Janice Wolf has a diagnosis of vitamin D deficiency. She is not currently taking OTC or prescription Vit D. She notes fatigue and denies nausea, vomiting or muscle weakness.  Insulin Resistance [name] has a diagnosis of insulin resistance based on her elevated fasting insulin level >5. Previous insulin was 35.5 and Hgb A1c was 5.6. Although Gabrella's blood glucose readings are still under good control, insulin resistance puts her at greater risk of metabolic syndrome and diabetes. She is not taking metformin currently and denies carbohydrate cravings. She continues to work on diet and exercise to decrease risk of diabetes.  ASSESSMENT AND PLAN:  Vitamin D deficiency - Plan: Vitamin D, Ergocalciferol, (DRISDOL) 1.25 MG  (50000 UT) CAPS capsule  Insulin resistance  Class 3 severe obesity with serious comorbidity and body mass index (BMI) of 45.0 to 49.9 in adult, unspecified obesity type ([place])  PLAN:  Vitamin D Deficiency Janice Wolf was informed that low vitamin D levels contributes to fatigue and are associated with obesity, breast, and colon cancer. Janice Wolf agrees to start prescription Vit D @50 ,000 IU every week #4 with no refills. She will follow up for routine testing of vitamin D, at least 2-3 times per year. She was informed of the risk of over-replacement of vitamin D and agrees to not increase her dose unless she discusses this with [place] first. Janice Wolf agrees to follow up with our clinic in 3 weeks.  Insulin Resistance Janice Wolf will continue to work on weight loss, exercise, and decreasing simple carbohydrates in her diet to help decrease the risk of diabetes. We dicussed metformin including benefits and risks. She was informed that eating too many simple carbohydrates or too many calories at one sitting increases the likelihood of GI side effects. Janice Wolf declined metformin for now and prescription was not written today. We will repeat Hgb A1c and insulin in 3 months. Janice Wolf agrees to follow up with our clinic in 3 weeks as directed to monitor her progress.  Obesity Janice Wolf is currently in the action stage of change. As such, her goal is to continue with weight loss efforts She has agreed to follow the Category 3 plan with Category 4 microwave meal options Janice Wolf has been instructed to work up to a goal of 150 minutes of combined  cardio and strengthening exercise per week for weight loss and overall health benefits. We discussed the following Behavioral Modification Strategies today: increasing lean protein intake, increasing vegetables, work on meal planning and easy cooking plans, and planning for success   Janice Wolf has agreed to follow up with our clinic in 3 weeks. She was informed of the  importance of frequent follow up visits to maximize her success with intensive lifestyle modifications for her multiple health conditions.  ALLERGIES: Allergies  Allergen Reactions  . Aspirin Nausea And Vomiting  . Latex Other (See Comments)    SKIN IRRITATION  . Other Rash    PERFUMES    MEDICATIONS: Current Outpatient Medications on File Prior to Visit  Medication Sig Dispense Refill  . esomeprazole (NEXIUM) 20 MG capsule Take 20 mg by mouth daily at 12 noon.    [name] levonorgestrel (MIRENA) 20 MCG/24HR IUD 1 each by Intrauterine route once.    [name] levothyroxine (SYNTHROID, LEVOTHROID) 25 MCG tablet Take 50 mcg by mouth daily before breakfast.     . Multiple Vitamins-Minerals (MULTIVITAMIN WITH MINERALS) tablet Take 1 tablet by mouth daily.     No current facility-administered medications on file prior to visit.     PAST MEDICAL HISTORY: Past Medical History:  Diagnosis Date  . Abnormal thyroid blood test    no treatment yet, per pt.  . Anxiety   . Back pain   . Complication of anesthesia    was hard to wake up after T & A 12/2015; had to stay overnight  . Dental crowns present   . Depression   . Dizziness   . Fatty liver   . Gallbladder problem   . GERD (gastroesophageal reflux disease)   . Hypothyroidism   . IBS (irritable bowel syndrome)   . Migraines    migraines  . Painful orthopaedic hardware ([place]) 09/2016   left foot  . PONV (postoperative nausea and vomiting)   . Stuffy nose 09/03/2016  . Vitamin D deficiency     PAST SURGICAL HISTORY: Past Surgical History:  Procedure Laterality Date  . CHOLECYSTECTOMY     age 27  . HARDWARE REMOVAL Left 10/01/2016   Procedure: HARDWARE REMOVAL left foot;  Surgeon: [name], MD;  Location: [place];  Service: Orthopedics;  Laterality: Left;  . TARSAL METATARSAL ARTHRODESIS Left 02/20/2016   Procedure: FIRST AND SECOND TARSAL METATARSAL ARTHRODESIS;  Surgeon: [name], MD;  Location: [place];  Service: Orthopedics;  Laterality: Left;  . TONSILLECTOMY AND ADENOIDECTOMY  12/2015    SOCIAL HISTORY: Social History   Tobacco Use  . Smoking status: Never Smoker  . Smokeless tobacco: Never Used  Substance Use Topics  . Alcohol use: No  . Drug use: No    FAMILY HISTORY: Family History  Problem Relation Age of Onset  . Diabetes Mother   . Hypertension Mother   . Anxiety disorder Mother   . Obesity Father   . Hyperlipidemia Father   . Hypertension Father   . Diabetes Father     ROS: Review of Systems  Constitutional: Positive for malaise/fatigue. Negative for weight loss.  Gastrointestinal: Negative for nausea and vomiting.  Musculoskeletal:       Negative muscle weakness    PHYSICAL EXAM: Pt in no acute distress  RECENT LABS AND TESTS: BMET No results found for: NA, K, CL, CO2, GLUCOSE, BUN, CREATININE, CALCIUM, GFRNONAA, GFRAA Lab Results  Component Value Date   HGBA1C 5.6 07/14/2018   Lab Results  Component Value Date   INSULIN 35.5 (H) 07/14/2018   CBC No results found for: WBC, RBC, HGB, HCT, PLT, MCV, MCH, MCHC, RDW, LYMPHSABS, MONOABS, EOSABS, BASOSABS Iron/TIBC/Ferritin/ %Sat No results found for: IRON, TIBC, FERRITIN, IRONPCTSAT Lipid Panel  No results found for: CHOL, TRIG, HDL, CHOLHDL, VLDL, LDLCALC, LDLDIRECT Hepatic Function Panel  No results found for: PROT, ALBUMIN, AST, ALT, ALKPHOS, BILITOT, BILIDIR, IBILI No results found for: TSH    I, Trixie Dredge, am acting as transcriptionist for Ilene Qua, MD  I have reviewed the above documentation for accuracy and completeness, and I agree with the above. - Ilene Qua, MD

## 2018-08-16 ENCOUNTER — Other Ambulatory Visit: Payer: Self-pay

## 2018-08-16 ENCOUNTER — Ambulatory Visit (INDEPENDENT_AMBULATORY_CARE_PROVIDER_SITE_OTHER): Payer: 59 | Admitting: Family Medicine

## 2018-08-16 DIAGNOSIS — E66813 Obesity, class 3: Secondary | ICD-10-CM

## 2018-08-16 DIAGNOSIS — E88819 Insulin resistance, unspecified: Secondary | ICD-10-CM

## 2018-08-16 DIAGNOSIS — E559 Vitamin D deficiency, unspecified: Secondary | ICD-10-CM

## 2018-08-16 DIAGNOSIS — E8881 Metabolic syndrome: Secondary | ICD-10-CM

## 2018-08-16 DIAGNOSIS — Z6841 Body Mass Index (BMI) 40.0 and over, adult: Secondary | ICD-10-CM

## 2018-08-19 ENCOUNTER — Other Ambulatory Visit (INDEPENDENT_AMBULATORY_CARE_PROVIDER_SITE_OTHER): Payer: Self-pay | Admitting: Family Medicine

## 2018-08-19 ENCOUNTER — Encounter (INDEPENDENT_AMBULATORY_CARE_PROVIDER_SITE_OTHER): Payer: Self-pay | Admitting: Family Medicine

## 2018-08-19 DIAGNOSIS — E559 Vitamin D deficiency, unspecified: Secondary | ICD-10-CM

## 2018-08-22 MED ORDER — VITAMIN D (ERGOCALCIFEROL) 1.25 MG (50000 UNIT) PO CAPS: 50000.0000 [IU] | ORAL_CAPSULE | ORAL | 0 refills | Status: DC

## 2018-08-22 NOTE — Progress Notes (Signed)
Office: [phone]  /  Fax: [phone] TeleHealth Visit:  Janice Wolf has verbally consented to this TeleHealth visit today. The patient is located at home, the provider is located at the [employer] [facility] office. The participants in this visit include the listed provider and patient. The visit was conducted today via face time.  HPI:   Chief Complaint: OBESITY Janice Wolf is here to discuss her progress with her obesity treatment plan. She is on the Category 3 plan with Category 4 microwave meal options and is following her eating plan approximately 85 % of the time. She states she is exercising 0 minutes 0 times per week. Janice Wolf has found it easier to follow the plan at work than at home. She id doing some walking around the neighborhood.  We were unable to weigh the patient today for this TeleHealth visit. She feels as if she has lost 3 lbs since her last visit. She has lost 0 lbs since starting treatment with [place].  Vitamin D Deficiency Janice Wolf has a diagnosis of vitamin D deficiency. She is currently taking prescription Vit D. She notes fatigue and denies nausea, vomiting or muscle weakness.  Insulin Resistance Janice Wolf has a diagnosis of insulin resistance based on her elevated fasting insulin level >5. Although Janice Wolf's blood glucose readings are still under good control, insulin resistance puts her at greater risk of metabolic syndrome and diabetes. She is not on medications and notes minimal carbohydrate cravings. She continues to work on diet and exercise to decrease risk of diabetes.  ASSESSMENT AND PLAN:  Vitamin D deficiency - Plan: Vitamin D, Ergocalciferol, (DRISDOL) 1.25 MG (50000 UT) CAPS capsule  Insulin resistance  Class 3 severe obesity with serious comorbidity and body mass index (BMI) of 45.0 to 49.9 in adult, unspecified obesity type ([place])  PLAN:  Vitamin D Deficiency Janice Wolf was informed that low vitamin D levels contributes to fatigue and  are associated with obesity, breast, and colon cancer. Janice Wolf agrees to continue taking prescription Vit D @50 ,000 IU every week #4 and we will refill for 1 month. She will follow up for routine testing of vitamin D, at least 2-3 times per year. She was informed of the risk of over-replacement of vitamin D and agrees to not increase her dose unless she discusses this with [place] first. Janice Wolf agrees to follow up with our clinic in 2 weeks.  Insulin Resistance Janice Wolf will continue to work on weight loss, exercise, and decreasing simple carbohydrates in her diet to help decrease the risk of diabetes. We dicussed metformin including benefits and risks. She was informed that eating too many simple carbohydrates or too many calories at one sitting increases the likelihood of GI side effects. Janice Wolf declined metformin for now and prescription was not written today. We will repeat labs in late June. Janice Wolf agrees to follow up with our clinic in 2 weeks as directed to monitor her progress.  Obesity Janice Wolf is currently in the action stage of change. As such, her goal is to continue with weight loss efforts She has agreed to follow the Category 3 plan with Category 4 microwave meal options Janice Wolf has been instructed to work up to a goal of 150 minutes of combined cardio and strengthening exercise per week for weight loss and overall health benefits. We discussed the following Behavioral Modification Strategies today: increasing lean protein intake, decreasing sodium intake, work on meal planning and easy cooking plans, ways to avoid boredom eating, increase H20 intake, better snacking choices, and planning  for success   Janice Wolf has agreed to follow up with our clinic in 2 weeks. She was informed of the importance of frequent follow up visits to maximize her success with intensive lifestyle modifications for her multiple health conditions.  ALLERGIES: Allergies  Allergen Reactions  . Aspirin Nausea  And Vomiting  . Latex Other (See Comments)    SKIN IRRITATION  . Other Rash    PERFUMES    MEDICATIONS: Current Outpatient Medications on File Prior to Visit  Medication Sig Dispense Refill  . esomeprazole (NEXIUM) 20 MG capsule Take 20 mg by mouth daily at 12 noon.    [name] levonorgestrel (MIRENA) 20 MCG/24HR IUD 1 each by Intrauterine route once.    [name] levothyroxine (SYNTHROID, LEVOTHROID) 25 MCG tablet Take 50 mcg by mouth daily before breakfast.     . Multiple Vitamins-Minerals (MULTIVITAMIN WITH MINERALS) tablet Take 1 tablet by mouth daily.     No current facility-administered medications on file prior to visit.     PAST MEDICAL HISTORY: Past Medical History:  Diagnosis Date  . Abnormal thyroid blood test    no treatment yet, per pt.  . Anxiety   . Back pain   . Complication of anesthesia    was hard to wake up after T & A 12/2015; had to stay overnight  . Dental crowns present   . Depression   . Dizziness   . Fatty liver   . Gallbladder problem   . GERD (gastroesophageal reflux disease)   . Hypothyroidism   . IBS (irritable bowel syndrome)   . Migraines    migraines  . Painful orthopaedic hardware ([place]) 09/2016   left foot  . PONV (postoperative nausea and vomiting)   . Stuffy nose 09/03/2016  . Vitamin D deficiency     PAST SURGICAL HISTORY: Past Surgical History:  Procedure Laterality Date  . CHOLECYSTECTOMY     age 63  . HARDWARE REMOVAL Left 10/01/2016   Procedure: HARDWARE REMOVAL left foot;  Surgeon: [name], MD;  Location: [facility];  Service: Orthopedics;  Laterality: Left;  . TARSAL METATARSAL ARTHRODESIS Left 02/20/2016   Procedure: FIRST AND SECOND TARSAL METATARSAL ARTHRODESIS;  Surgeon: [name], MD;  Location: [facility];  Service: Orthopedics;  Laterality: Left;  . TONSILLECTOMY AND ADENOIDECTOMY  12/2015    SOCIAL HISTORY: Social History   Tobacco Use  . Smoking status: Never Smoker  . Smokeless  tobacco: Never Used  Substance Use Topics  . Alcohol use: No  . Drug use: No    FAMILY HISTORY: Family History  Problem Relation Age of Onset  . Diabetes Mother   . Hypertension Mother   . Anxiety disorder Mother   . Obesity Father   . Hyperlipidemia Father   . Hypertension Father   . Diabetes Father     ROS: Review of Systems  Constitutional: Positive for malaise/fatigue and weight loss.  Gastrointestinal: Negative for nausea and vomiting.  Musculoskeletal:       Negative muscle weakness    PHYSICAL EXAM: Pt in no acute distress  RECENT LABS AND TESTS: BMET No results found for: NA, K, CL, CO2, GLUCOSE, BUN, CREATININE, CALCIUM, GFRNONAA, GFRAA Lab Results  Component Value Date   HGBA1C 5.6 07/14/2018   Lab Results  Component Value Date   INSULIN 35.5 (H) 07/14/2018   CBC No results found for: WBC, RBC, HGB, HCT, PLT, MCV, MCH, MCHC, RDW, LYMPHSABS, MONOABS, EOSABS, BASOSABS Iron/TIBC/Ferritin/ %Sat No results found for: IRON, TIBC,  FERRITIN, IRONPCTSAT Lipid Panel  No results found for: CHOL, TRIG, HDL, CHOLHDL, VLDL, LDLCALC, LDLDIRECT Hepatic Function Panel  No results found for: PROT, ALBUMIN, AST, ALT, ALKPHOS, BILITOT, BILIDIR, IBILI No results found for: TSH    I, Trixie Dredge, am acting as transcriptionist for Ilene Qua, MD  I have reviewed the above documentation for accuracy and completeness, and I agree with the above. - Ilene Qua, MD

## 2018-08-31 ENCOUNTER — Ambulatory Visit (INDEPENDENT_AMBULATORY_CARE_PROVIDER_SITE_OTHER): Payer: 59 | Admitting: Family Medicine

## 2018-08-31 ENCOUNTER — Encounter (INDEPENDENT_AMBULATORY_CARE_PROVIDER_SITE_OTHER): Payer: Self-pay | Admitting: Family Medicine

## 2018-08-31 ENCOUNTER — Other Ambulatory Visit: Payer: Self-pay

## 2018-08-31 DIAGNOSIS — E038 Other specified hypothyroidism: Secondary | ICD-10-CM

## 2018-08-31 DIAGNOSIS — E88819 Insulin resistance, unspecified: Secondary | ICD-10-CM

## 2018-08-31 DIAGNOSIS — E8881 Metabolic syndrome: Secondary | ICD-10-CM

## 2018-08-31 DIAGNOSIS — Z6841 Body Mass Index (BMI) 40.0 and over, adult: Secondary | ICD-10-CM

## 2018-08-31 DIAGNOSIS — E66813 Obesity, class 3: Secondary | ICD-10-CM

## 2018-09-01 NOTE — Progress Notes (Signed)
Office: [phone]  /  Fax: [phone] TeleHealth Visit:  Janice Wolf has verbally consented to this TeleHealth visit today. The patient is located at home, the provider is located at the [employer] [facility] office. The participants in this visit include the listed provider and patient and patient's husband. The visit was conducted today via face time.  HPI:   Chief Complaint: OBESITY Janice Wolf is here to discuss her progress with her obesity treatment plan. She is on the Category 3 plan with Category 4 microwave meal options and is following her eating plan approximately 90 % of the time. She states she is exercising 0 minutes 0 times per week. Janice Wolf has been feeling better in the past two weeks. She finds that when she isn't working she doesn't eat like she should. She is either not eating on the plan or not eating all of the food. She is weighing in at 295 lbs. She states the problem isn't sticking to her diet, but getting creative.  We were unable to weigh the patient today for this TeleHealth visit. She feels as if she has lost weight since her last visit. She has lost 0 lbs since starting treatment with [place].  Insulin Resistance Janice Wolf has a diagnosis of insulin resistance based on her elevated fasting insulin level >5. Last Hgb A1c was of 5.6 and insulin of 35.5. Although Meilyn's blood glucose readings are still under good control, insulin resistance puts her at greater risk of metabolic syndrome and diabetes. She is not taking metformin currently and continues to work on diet and exercise to decrease risk of diabetes.  Hypothyroidism Janice Wolf has a diagnosis of hypothyroidism. She is currently on Synthroid. She denies hot or cold intolerance or palpitations.  ASSESSMENT AND PLAN:  Insulin resistance  Other specified hypothyroidism  Class 3 severe obesity with serious comorbidity and body mass index (BMI) of 45.0 to 49.9 in adult, unspecified obesity type ([place])   PLAN:  Insulin Resistance Janice Wolf will continue to work on weight loss, exercise, and decreasing simple carbohydrates in her diet to help decrease the risk of diabetes. We dicussed metformin including benefits and risks. She was informed that eating too many simple carbohydrates or too many calories at one sitting increases the likelihood of GI side effects. Martyna declined metformin for now and prescription was not written today. We will repeat labs at the end of June. Nicolena agrees to follow up with our clinic in 2 weeks as directed to monitor her progress.  Hypothyroidism Janice Wolf was informed of the importance of good thyroid control to help with weight loss efforts. She was also informed that supertheraputic thyroid levels are dangerous and will not improve weight loss results. We will repeat TSH at the end of June. Janice Wolf agrees to follow up with our clinic in 2 weeks.  Obesity Janice Wolf is currently in the action stage of change. As such, her goal is to continue with weight loss efforts She has agreed to follow the Category 3 plan Janice Wolf has been instructed to work up to a goal of 150 minutes of combined cardio and strengthening exercise per week for weight loss and overall health benefits. We discussed the following Behavioral Modification Strategies today: increasing lean protein intake, increasing vegetables and work on meal planning and easy cooking plans, no skipping meals, and planning for success   Janice Wolf has agreed to follow up with our clinic in 2 weeks. She was informed of the importance of frequent follow up visits to maximize her  success with intensive lifestyle modifications for her multiple health conditions.  ALLERGIES: Allergies  Allergen Reactions  . Aspirin Nausea And Vomiting  . Latex Other (See Comments)    SKIN IRRITATION  . Other Rash    PERFUMES    MEDICATIONS: Current Outpatient Medications on File Prior to Visit  Medication Sig Dispense  Refill  . esomeprazole (NEXIUM) 20 MG capsule Take 20 mg by mouth daily at 12 noon.    [name] levonorgestrel (MIRENA) 20 MCG/24HR IUD 1 each by Intrauterine route once.    [name] levothyroxine (SYNTHROID, LEVOTHROID) 25 MCG tablet Take 50 mcg by mouth daily before breakfast.     . Multiple Vitamins-Minerals (MULTIVITAMIN WITH MINERALS) tablet Take 1 tablet by mouth daily.    . Vitamin D, Ergocalciferol, (DRISDOL) 1.25 MG (50000 UT) CAPS capsule Take 1 capsule (50,000 Units total) by mouth every 7 (seven) days. 4 capsule 0   No current facility-administered medications on file prior to visit.     PAST MEDICAL HISTORY: Past Medical History:  Diagnosis Date  . Abnormal thyroid blood test    no treatment yet, per pt.  . Anxiety   . Back pain   . Complication of anesthesia    was hard to wake up after T & A 12/2015; had to stay overnight  . Dental crowns present   . Depression   . Dizziness   . Fatty liver   . Gallbladder problem   . GERD (gastroesophageal reflux disease)   . Hypothyroidism   . IBS (irritable bowel syndrome)   . Migraines    migraines  . Painful orthopaedic hardware ([place]) 09/2016   left foot  . PONV (postoperative nausea and vomiting)   . Stuffy nose 09/03/2016  . Vitamin D deficiency     PAST SURGICAL HISTORY: Past Surgical History:  Procedure Laterality Date  . CHOLECYSTECTOMY     age 23  . HARDWARE REMOVAL Left 10/01/2016   Procedure: HARDWARE REMOVAL left foot;  Surgeon: [name], MD;  Location: [facility];  Service: Orthopedics;  Laterality: Left;  . TARSAL METATARSAL ARTHRODESIS Left 02/20/2016   Procedure: FIRST AND SECOND TARSAL METATARSAL ARTHRODESIS;  Surgeon: [name], MD;  Location: [facility];  Service: Orthopedics;  Laterality: Left;  . TONSILLECTOMY AND ADENOIDECTOMY  12/2015    SOCIAL HISTORY: Social History   Tobacco Use  . Smoking status: Never Smoker  . Smokeless tobacco: Never Used  Substance Use Topics  .  Alcohol use: No  . Drug use: No    FAMILY HISTORY: Family History  Problem Relation Age of Onset  . Diabetes Mother   . Hypertension Mother   . Anxiety disorder Mother   . Obesity Father   . Hyperlipidemia Father   . Hypertension Father   . Diabetes Father     ROS: Review of Systems  Constitutional: Positive for weight loss.  Cardiovascular: Negative for palpitations.  Endo/Heme/Allergies:       Negative hot/cod intolerance    PHYSICAL EXAM: Pt in no acute distress  RECENT LABS AND TESTS: BMET No results found for: NA, K, CL, CO2, GLUCOSE, BUN, CREATININE, CALCIUM, GFRNONAA, GFRAA Lab Results  Component Value Date   HGBA1C 5.6 07/14/2018   Lab Results  Component Value Date   INSULIN 35.5 (H) 07/14/2018   CBC No results found for: WBC, RBC, HGB, HCT, PLT, MCV, MCH, MCHC, RDW, LYMPHSABS, MONOABS, EOSABS, BASOSABS Iron/TIBC/Ferritin/ %Sat No results found for: IRON, TIBC, FERRITIN, IRONPCTSAT Lipid Panel  No results  found for: CHOL, TRIG, HDL, CHOLHDL, VLDL, LDLCALC, LDLDIRECT Hepatic Function Panel  No results found for: PROT, ALBUMIN, AST, ALT, ALKPHOS, BILITOT, BILIDIR, IBILI No results found for: TSH    I, Trixie Dredge, am acting as transcriptionist for Ilene Qua, MD  I have reviewed the above documentation for accuracy and completeness, and I agree with the above. - Ilene Qua, MD

## 2018-09-13 ENCOUNTER — Ambulatory Visit (INDEPENDENT_AMBULATORY_CARE_PROVIDER_SITE_OTHER): Payer: 59 | Admitting: Family Medicine

## 2018-09-13 ENCOUNTER — Other Ambulatory Visit: Payer: Self-pay

## 2018-09-13 ENCOUNTER — Encounter (INDEPENDENT_AMBULATORY_CARE_PROVIDER_SITE_OTHER): Payer: Self-pay | Admitting: Family Medicine

## 2018-09-13 DIAGNOSIS — Z6841 Body Mass Index (BMI) 40.0 and over, adult: Secondary | ICD-10-CM | POA: Diagnosis not present

## 2018-09-13 DIAGNOSIS — E8881 Metabolic syndrome: Secondary | ICD-10-CM | POA: Diagnosis not present

## 2018-09-13 DIAGNOSIS — E559 Vitamin D deficiency, unspecified: Secondary | ICD-10-CM

## 2018-09-13 MED ORDER — VITAMIN D (ERGOCALCIFEROL) 1.25 MG (50000 UNIT) PO CAPS: 50000.0000 [IU] | ORAL_CAPSULE | ORAL | 0 refills | Status: DC

## 2018-09-14 NOTE — Progress Notes (Signed)
Office: [phone]  /  Fax: [phone] TeleHealth Visit:  Janice BRACHER has verbally consented to this TeleHealth visit today. The patient is located at home, the provider is located at the [employer] [facility] office. The participants in this visit include the listed provider and patient and patient's husband. The visit was conducted today via face time.  HPI:   Chief Complaint: OBESITY Janice Wolf is here to discuss her progress with her obesity treatment plan. She is on the Category 3 plan and is following her eating plan approximately 80-90 % of the time. She states she is exercising 0 minutes 0 times per week. Janice Wolf has had a good few weeks. She worked quite a bit and did a lot of work outside on [employer]. She finds that following the plan is easier while at work, and she struggles with getting all of the food in when not working. Her weight is of 292 lbs.  We were unable to weigh the patient today for this TeleHealth visit. She feels as if she has lost 4 lbs since her last visit. She has lost 0 lbs since starting treatment with [place].  Vitamin D Deficiency Janice Wolf has a diagnosis of vitamin D deficiency. She is currently taking prescription Vit D. She notes fatigue and denies nausea, vomiting or muscle weakness.  Insulin Resistance Janice Wolf has a diagnosis of insulin resistance based on her elevated fasting insulin level >5. Last Hgb A1c was of 5.6 and insulin of 35.5. Although [name]'s blood glucose readings are still under good control, insulin resistance puts her at greater risk of metabolic syndrome and diabetes. She is not taking metformin currently and notes occasional carbohydrate cravings. She continues to work on diet and exercise to decrease risk of diabetes.  ASSESSMENT AND PLAN:  Vitamin D deficiency - Plan: Vitamin D, Ergocalciferol, (DRISDOL) 1.25 MG (50000 UT) CAPS capsule  Insulin resistance  Class 3 severe obesity with serious comorbidity and body mass  index (BMI) of 45.0 to 49.9 in adult, unspecified obesity type ([place])  PLAN:  Vitamin D Deficiency Janice Wolf was informed that low vitamin D levels contributes to fatigue and are associated with obesity, breast, and colon cancer. Janice Wolf agrees to continue taking prescription Vit D @50 ,000 IU every week #4 and we will refill for 1 month. She will follow up for routine testing of vitamin D, at least 2-3 times per year. She was informed of the risk of over-replacement of vitamin D and agrees to not increase her dose unless she discusses this with [place] first. Janice Wolf agrees to follow up with our clinic in 3 weeks.  Insulin Resistance [name] will continue to work on weight loss, exercise, and decreasing simple carbohydrates in her diet to help decrease the risk of diabetes. We dicussed metformin including benefits and risks. She was informed that eating too many simple carbohydrates or too many calories at one sitting increases the likelihood of GI side effects. [name] declined metformin for now and prescription was not written today. We will repeat labs in mid June. [name] agrees to follow up with our clinic in 3 weeks as directed to monitor her progress.  Obesity Janice Wolf is currently in the action stage of change. As such, her goal is to continue with weight loss efforts She has agreed to follow the Category 3 plan Janice Wolf has been instructed to work up to a goal of 150 minutes of combined cardio and strengthening exercise per week for weight loss and overall health benefits. We discussed the following  Behavioral Modification Strategies today: increasing lean protein intake, increasing vegetables and work on meal planning and easy cooking plans, keeping healthy foods in the home, and planning for success   Janice Wolf has agreed to follow up with our clinic in 3 weeks. She was informed of the importance of frequent follow up visits to maximize her success with intensive lifestyle modifications for  her multiple health conditions.  ALLERGIES: Allergies  Allergen Reactions  . Aspirin Nausea And Vomiting  . Latex Other (See Comments)    SKIN IRRITATION  . Other Rash    PERFUMES    MEDICATIONS: Current Outpatient Medications on File Prior to Visit  Medication Sig Dispense Refill  . esomeprazole (NEXIUM) 20 MG capsule Take 20 mg by mouth daily at 12 noon.    Janice Wolf levonorgestrel (MIRENA) 20 MCG/24HR IUD 1 each by Intrauterine route once.    Janice Wolf levothyroxine (SYNTHROID, LEVOTHROID) 25 MCG tablet Take 50 mcg by mouth daily before breakfast.     . Multiple Vitamins-Minerals (MULTIVITAMIN WITH MINERALS) tablet Take 1 tablet by mouth daily.     No current facility-administered medications on file prior to visit.     PAST MEDICAL HISTORY: Past Medical History:  Diagnosis Date  . Abnormal thyroid blood test    no treatment yet, per pt.  . Anxiety   . Back pain   . Complication of anesthesia    was hard to wake up after T & A 12/2015; had to stay overnight  . Dental crowns present   . Depression   . Dizziness   . Fatty liver   . Gallbladder problem   . GERD (gastroesophageal reflux disease)   . Hypothyroidism   . IBS (irritable bowel syndrome)   . Migraines    migraines  . Painful orthopaedic hardware ([place]) 09/2016   left foot  . PONV (postoperative nausea and vomiting)   . Stuffy nose 09/03/2016  . Vitamin D deficiency     PAST SURGICAL HISTORY: Past Surgical History:  Procedure Laterality Date  . CHOLECYSTECTOMY     age 89  . HARDWARE REMOVAL Left 10/01/2016   Procedure: HARDWARE REMOVAL left foot;  Surgeon: [name], MD;  Location: [place];  Service: Orthopedics;  Laterality: Left;  . TARSAL METATARSAL ARTHRODESIS Left 02/20/2016   Procedure: FIRST AND SECOND TARSAL METATARSAL ARTHRODESIS;  Surgeon: [name], MD;  Location: [facility];  Service: Orthopedics;  Laterality: Left;  . TONSILLECTOMY AND ADENOIDECTOMY  12/2015     SOCIAL HISTORY: Social History   Tobacco Use  . Smoking status: Never Smoker  . Smokeless tobacco: Never Used  Substance Use Topics  . Alcohol use: No  . Drug use: No    FAMILY HISTORY: Family History  Problem Relation Age of Onset  . Diabetes Mother   . Hypertension Mother   . Anxiety disorder Mother   . Obesity Father   . Hyperlipidemia Father   . Hypertension Father   . Diabetes Father     ROS: Review of Systems  Constitutional: Positive for malaise/fatigue and weight loss.  Gastrointestinal: Negative for nausea and vomiting.  Musculoskeletal:       Negative muscle weakness    PHYSICAL EXAM: Pt in no acute distress  RECENT LABS AND TESTS: BMET No results found for: NA, K, CL, CO2, GLUCOSE, BUN, CREATININE, CALCIUM, GFRNONAA, GFRAA Lab Results  Component Value Date   HGBA1C 5.6 07/14/2018   Lab Results  Component Value Date   INSULIN 35.5 (H) 07/14/2018  CBC No results found for: WBC, RBC, HGB, HCT, PLT, MCV, MCH, MCHC, RDW, LYMPHSABS, MONOABS, EOSABS, BASOSABS Iron/TIBC/Ferritin/ %Sat No results found for: IRON, TIBC, FERRITIN, IRONPCTSAT Lipid Panel  No results found for: CHOL, TRIG, HDL, CHOLHDL, VLDL, LDLCALC, LDLDIRECT Hepatic Function Panel  No results found for: PROT, ALBUMIN, AST, ALT, ALKPHOS, BILITOT, BILIDIR, IBILI No results found for: TSH    I, Trixie Dredge, am acting as transcriptionist for Ilene Qua, MD  I have reviewed the above documentation for accuracy and completeness, and I agree with the above. - Ilene Qua, MD

## 2018-10-05 ENCOUNTER — Ambulatory Visit (INDEPENDENT_AMBULATORY_CARE_PROVIDER_SITE_OTHER): Payer: 59 | Admitting: Family Medicine

## 2018-10-06 ENCOUNTER — Ambulatory Visit (INDEPENDENT_AMBULATORY_CARE_PROVIDER_SITE_OTHER): Payer: 59 | Admitting: Family Medicine

## 2018-10-06 ENCOUNTER — Encounter (INDEPENDENT_AMBULATORY_CARE_PROVIDER_SITE_OTHER): Payer: Self-pay | Admitting: Family Medicine

## 2018-10-06 ENCOUNTER — Other Ambulatory Visit: Payer: Self-pay

## 2018-10-06 DIAGNOSIS — E559 Vitamin D deficiency, unspecified: Secondary | ICD-10-CM | POA: Diagnosis not present

## 2018-10-06 DIAGNOSIS — E8881 Metabolic syndrome: Secondary | ICD-10-CM | POA: Diagnosis not present

## 2018-10-06 DIAGNOSIS — Z6841 Body Mass Index (BMI) 40.0 and over, adult: Secondary | ICD-10-CM | POA: Diagnosis not present

## 2018-10-06 MED ORDER — VITAMIN D (ERGOCALCIFEROL) 1.25 MG (50000 UNIT) PO CAPS: 50000.0000 [IU] | ORAL_CAPSULE | ORAL | 0 refills | Status: DC

## 2018-10-10 NOTE — Progress Notes (Signed)
Office: [phone]  /  Fax: [phone] TeleHealth Visit:  ROBERTO HLAVATY has verbally consented to this TeleHealth visit today. The patient is located at home, the provider is located at the [facility] office. The participants in this visit include the listed provider and patient and patient's husband. The visit was conducted today via face time.  HPI:   Chief Complaint: OBESITY Alyxandra is here to discuss her progress with her obesity treatment plan. She is on the Category 3 plan and is following her eating plan approximately 50 % of the time. She states she is exercising 0 minutes 0 times per week. Kanchan's weight is of 294 lbs today. She notes occasional hunger, more prevalent on days she followed the plan. She would like to follow the plan but does realize she will likely have to meal plan in order to follow the plan more strictly.  We were unable to weigh the patient today for this TeleHealth visit. She feels as if she has lost 2 lbs since her last visit. She has lost 4 lbs since starting treatment with [place].  Vitamin D Deficiency Ernesto has a diagnosis of vitamin D deficiency. She is currently taking prescription Vit D. She notes fatigue and denies nausea, vomiting or muscle weakness.  Insulin Resistance Aeris has a diagnosis of insulin resistance based on her elevated fasting insulin level >5. Last Hgb A1c was of 5.6 and insulin of 35.5. Although Harlei's blood glucose readings are still under good control, insulin resistance puts her at greater risk of metabolic syndrome and diabetes. She is not taking metformin currently and continues to work on diet and exercise to decrease risk of diabetes.  ASSESSMENT AND PLAN:  Vitamin D deficiency - Plan: Vitamin D, Ergocalciferol, (DRISDOL) 1.25 MG (50000 UT) CAPS capsule  Insulin resistance  Class 3 severe obesity with serious comorbidity and body mass index (BMI) of 45.0 to 49.9 in adult, unspecified obesity  type ([place])  PLAN:  Vitamin D Deficiency Caleigh was informed that low vitamin D levels contributes to fatigue and are associated with obesity, breast, and colon cancer. Xuan agrees to continue taking prescription Vit D @50 ,000 IU every week #4 and we will refill for 1 month. She will follow up for routine testing of vitamin D, at least 2-3 times per year. She was informed of the risk of over-replacement of vitamin D and agrees to not increase her dose unless she discusses this with [place] first. [name] agrees to follow up with our clinic in 2 weeks.  Insulin Resistance Estell will continue to work on weight loss, exercise, and decreasing simple carbohydrates in her diet to help decrease the risk of diabetes. We dicussed metformin including benefits and risks. She was informed that eating too many simple carbohydrates or too many calories at one sitting increases the likelihood of GI side effects. Feliza declined metformin for now and prescription was not written today. We will repeat labs at her first in person appointment. Fleur agrees to follow up with our clinic in 2 weeks as directed to monitor her progress.  Obesity Claudie is currently in the action stage of change. As such, her goal is to continue with weight loss efforts She has agreed to follow the Category 3 plan Daniella has been instructed to work up to a goal of 150 minutes of combined cardio and strengthening exercise per week for weight loss and overall health benefits. We discussed the following Behavioral Modification Strategies today: increasing lean protein intake, increasing vegetables  and work on meal planning and easy cooking plans, keeping healthy foods in the home, better snacking choices, and planning for success   Jaila has agreed to follow up with our clinic in 2 weeks. She was informed of the importance of frequent follow up visits to maximize her success with intensive lifestyle modifications for her  multiple health conditions.  ALLERGIES: Allergies  Allergen Reactions  . Aspirin Nausea And Vomiting  . Latex Other (See Comments)    SKIN IRRITATION  . Other Rash    PERFUMES    MEDICATIONS: Current Outpatient Medications on File Prior to Visit  Medication Sig Dispense Refill  . esomeprazole (NEXIUM) 20 MG capsule Take 20 mg by mouth daily at 12 noon.    [name] levonorgestrel (MIRENA) 20 MCG/24HR IUD 1 each by Intrauterine route once.    [name] levothyroxine (SYNTHROID, LEVOTHROID) 25 MCG tablet Take 50 mcg by mouth daily before breakfast.     . Multiple Vitamins-Minerals (MULTIVITAMIN WITH MINERALS) tablet Take 1 tablet by mouth daily.     No current facility-administered medications on file prior to visit.     PAST MEDICAL HISTORY: Past Medical History:  Diagnosis Date  . Abnormal thyroid blood test    no treatment yet, per pt.  . Anxiety   . Back pain   . Complication of anesthesia    was hard to wake up after T & A 12/2015; had to stay overnight  . Dental crowns present   . Depression   . Dizziness   . Fatty liver   . Gallbladder problem   . GERD (gastroesophageal reflux disease)   . Hypothyroidism   . IBS (irritable bowel syndrome)   . Migraines    migraines  . Painful orthopaedic hardware ([place]) 09/2016   left foot  . PONV (postoperative nausea and vomiting)   . Stuffy nose 09/03/2016  . Vitamin D deficiency     PAST SURGICAL HISTORY: Past Surgical History:  Procedure Laterality Date  . CHOLECYSTECTOMY     age 17  . HARDWARE REMOVAL Left 10/01/2016   Procedure: HARDWARE REMOVAL left foot;  Surgeon: [name], MD;  Location: [facility];  Service: Orthopedics;  Laterality: Left;  . TARSAL METATARSAL ARTHRODESIS Left 02/20/2016   Procedure: FIRST AND SECOND TARSAL METATARSAL ARTHRODESIS;  Surgeon: [name], MD;  Location: [facility];  Service: Orthopedics;  Laterality: Left;  . TONSILLECTOMY AND ADENOIDECTOMY  12/2015    SOCIAL  HISTORY: Social History   Tobacco Use  . Smoking status: Never Smoker  . Smokeless tobacco: Never Used  Substance Use Topics  . Alcohol use: No  . Drug use: No    FAMILY HISTORY: Family History  Problem Relation Age of Onset  . Diabetes Mother   . Hypertension Mother   . Anxiety disorder Mother   . Obesity Father   . Hyperlipidemia Father   . Hypertension Father   . Diabetes Father     ROS: Review of Systems  Constitutional: Positive for malaise/fatigue and weight loss.  Gastrointestinal: Negative for nausea and vomiting.  Musculoskeletal:       Negative muscle weakness    PHYSICAL EXAM: Pt in no acute distress  RECENT LABS AND TESTS: BMET No results found for: NA, K, CL, CO2, GLUCOSE, BUN, CREATININE, CALCIUM, GFRNONAA, GFRAA Lab Results  Component Value Date   HGBA1C 5.6 07/14/2018   Lab Results  Component Value Date   INSULIN 35.5 (H) 07/14/2018   CBC No results found for: WBC,  RBC, HGB, HCT, PLT, MCV, MCH, MCHC, RDW, LYMPHSABS, MONOABS, EOSABS, BASOSABS Iron/TIBC/Ferritin/ %Sat No results found for: IRON, TIBC, FERRITIN, IRONPCTSAT Lipid Panel  No results found for: CHOL, TRIG, HDL, CHOLHDL, VLDL, LDLCALC, LDLDIRECT Hepatic Function Panel  No results found for: PROT, ALBUMIN, AST, ALT, ALKPHOS, BILITOT, BILIDIR, IBILI No results found for: TSH    I, Trixie Dredge, am acting as transcriptionist for Ilene Qua, MD   I have reviewed the above documentation for accuracy and completeness, and I agree with the above. - Ilene Qua, MD

## 2018-10-25 ENCOUNTER — Other Ambulatory Visit: Payer: Self-pay

## 2018-10-25 ENCOUNTER — Ambulatory Visit (INDEPENDENT_AMBULATORY_CARE_PROVIDER_SITE_OTHER): Payer: 59 | Admitting: Family Medicine

## 2018-10-25 ENCOUNTER — Encounter (INDEPENDENT_AMBULATORY_CARE_PROVIDER_SITE_OTHER): Payer: Self-pay | Admitting: Family Medicine

## 2018-10-25 VITALS — BP 94/62 | HR 82 | Temp 97.8°F | Ht 67.0 in | Wt 291.0 lb

## 2018-10-25 DIAGNOSIS — Z9189 Other specified personal risk factors, not elsewhere classified: Secondary | ICD-10-CM | POA: Diagnosis not present

## 2018-10-25 DIAGNOSIS — Z6841 Body Mass Index (BMI) 40.0 and over, adult: Secondary | ICD-10-CM

## 2018-10-25 DIAGNOSIS — E559 Vitamin D deficiency, unspecified: Secondary | ICD-10-CM | POA: Diagnosis not present

## 2018-10-25 DIAGNOSIS — E038 Other specified hypothyroidism: Secondary | ICD-10-CM | POA: Diagnosis not present

## 2018-10-25 DIAGNOSIS — E66813 Obesity, class 3: Secondary | ICD-10-CM

## 2018-10-25 DIAGNOSIS — E8881 Metabolic syndrome: Secondary | ICD-10-CM | POA: Diagnosis not present

## 2018-10-25 DIAGNOSIS — E88819 Insulin resistance, unspecified: Secondary | ICD-10-CM

## 2018-10-26 LAB — HEMOGLOBIN A1C
Est. average glucose Bld gHb Est-mCnc: 108 mg/dL
Hgb A1c MFr Bld: 5.4 % (ref 4.8–5.6)

## 2018-10-26 LAB — TSH: TSH: 2.75 u[IU]/mL (ref 0.450–4.500)

## 2018-10-26 LAB — INSULIN, RANDOM: INSULIN: 52.2 u[IU]/mL — ABNORMAL HIGH (ref 2.6–24.9)

## 2018-10-26 LAB — VITAMIN D 25 HYDROXY (VIT D DEFICIENCY, FRACTURES): Vit D, 25-Hydroxy: 33.1 ng/mL (ref 30.0–100.0)

## 2018-10-26 NOTE — Progress Notes (Signed)
Office: [phone]  /  Fax: [phone]   HPI:   Chief Complaint: OBESITY Janice Wolf is here to discuss her progress with her obesity treatment plan. She is on the Category 3 plan and is following her eating plan approximately 60 to 70 % of the time. She states she is exercising 0 minutes 0 times per week. Janice Wolf reports that the past few weeks have been long and stressful. Work has been difficult, but Janice Wolf has applied to change positions, so she will have a more consistent schedule. Her weight is 291 lb (132 kg) today and has had a weight loss of 13 pounds since her last in-office visit. She has lost 13 lbs since starting treatment with [place].  Insulin Resistance Janice Wolf has a diagnosis of insulin resistance based on her elevated fasting insulin level >5. Although Kaylani's blood glucose readings are still under good control, insulin resistance puts her at greater risk of metabolic syndrome and diabetes. She is not taking metformin currently and continues to work on diet and exercise to decrease risk of diabetes.  Hypothyroidism Janice Wolf has a diagnosis of hypothyroidism. She is on levothyroxine. She denies hot or cold intolerance or palpitations, but does admit to fatigue.  Vitamin D deficiency Janice Wolf has a diagnosis of vitamin D deficiency. Janice Wolf is currently taking vit D. She admits to fatigue and  denies nausea, vomiting or muscle weakness.  At risk for osteopenia and osteoporosis Janice Wolf is at higher risk of osteopenia and osteoporosis due to vitamin D deficiency.   ASSESSMENT AND PLAN:  Insulin resistance - Plan: Hemoglobin A1c, Insulin, random  Other specified hypothyroidism - Plan: TSH  Vitamin D deficiency - Plan: VITAMIN D 25 Hydroxy (Vit-D Deficiency, Fractures)  At risk for osteoporosis  Class 3 severe obesity with serious comorbidity and body mass index (BMI) of 45.0 to 49.9 in adult, unspecified obesity type ([place])  PLAN:  Insulin Resistance Janice Wolf  will continue to work on weight loss, exercise, and decreasing simple carbohydrates in her diet to help decrease the risk of diabetes. She was informed that eating too many simple carbohydrates or too many calories at one sitting increases the likelihood of GI side effects. We will check Hgb A1c and insulin level today. Jasa agreed to follow up with [place] as directed to monitor her progress.  Hypothyroidism Janice Wolf was informed of the importance of good thyroid control to help with weight loss efforts. She was also informed that supertherapeutic thyroid levels are dangerous and will not improve weight loss results. We will check TSH today and Janice Wolf agrees to follow up as directed.  Vitamin D Deficiency Janice Wolf was informed that low vitamin D levels contributes to fatigue and are associated with obesity, breast, and colon cancer. She agrees to continue to take prescription Vit D @50 ,000 IU every week and will follow up for routine testing of vitamin D, at least 2-3 times per year. She was informed of the risk of over-replacement of vitamin D and agrees to not increase her dose unless she discusses this with [place] first.  At risk for osteopenia and osteoporosis Janice Wolf was given extended  (15 minutes) osteoporosis prevention counseling today. Janice Wolf is at risk for osteopenia and osteoporosis due to her vitamin D deficiency. She was encouraged to take her vitamin D and follow her higher calcium diet and increase strengthening exercise to help strengthen her bones and decrease her risk of osteopenia and osteoporosis.  Obesity Janice Wolf is currently in the action stage of change. As such, her goal is to  continue with weight loss efforts She has agreed to keep a food journal with 450 to 600 calories and 40+ grams of protein at supper daily and follow the Category 3 plan Janice Wolf has been instructed to work up to a goal of 150 minutes of combined cardio and strengthening exercise per week for weight  loss and overall health benefits. We discussed the following Behavioral Modification Strategies today: planning for success, keeping healthy foods in the home, increasing lean protein intake, increasing vegetables and work on meal planning and easy cooking plans  Janice Wolf has agreed to follow up with our clinic in 2 weeks. She was informed of the importance of frequent follow up visits to maximize her success with intensive lifestyle modifications for her multiple health conditions.  ALLERGIES: Allergies  Allergen Reactions  . Aspirin Nausea And Vomiting  . Latex Other (See Comments)    SKIN IRRITATION  . Other Rash    PERFUMES    MEDICATIONS: Current Outpatient Medications on File Prior to Visit  Medication Sig Dispense Refill  . esomeprazole (NEXIUM) 20 MG capsule Take 20 mg by mouth daily at 12 noon.    [name] levonorgestrel (MIRENA) 20 MCG/24HR IUD 1 each by Intrauterine route once.    [name] levothyroxine (SYNTHROID, LEVOTHROID) 25 MCG tablet Take 50 mcg by mouth daily before breakfast.     . Multiple Vitamins-Minerals (MULTIVITAMIN WITH MINERALS) tablet Take 1 tablet by mouth daily.    . Vitamin D, Ergocalciferol, (DRISDOL) 1.25 MG (50000 UT) CAPS capsule Take 1 capsule (50,000 Units total) by mouth every 7 (seven) days. 4 capsule 0   No current facility-administered medications on file prior to visit.     PAST MEDICAL HISTORY: Past Medical History:  Diagnosis Date  . Abnormal thyroid blood test    no treatment yet, per pt.  . Anxiety   . Back pain   . Complication of anesthesia    was hard to wake up after T & A 12/2015; had to stay overnight  . Dental crowns present   . Depression   . Dizziness   . Fatty liver   . Gallbladder problem   . GERD (gastroesophageal reflux disease)   . Hypothyroidism   . IBS (irritable bowel syndrome)   . Migraines    migraines  . Painful orthopaedic hardware ([place]) 09/2016   left foot  . PONV (postoperative nausea and vomiting)   . Stuffy nose  09/03/2016  . Vitamin D deficiency     PAST SURGICAL HISTORY: Past Surgical History:  Procedure Laterality Date  . CHOLECYSTECTOMY     age 31  . HARDWARE REMOVAL Left 10/01/2016   Procedure: HARDWARE REMOVAL left foot;  Surgeon: [name], MD;  Location: [facility];  Service: Orthopedics;  Laterality: Left;  . TARSAL METATARSAL ARTHRODESIS Left 02/20/2016   Procedure: FIRST AND SECOND TARSAL METATARSAL ARTHRODESIS;  Surgeon: [name], MD;  Location: [facility];  Service: Orthopedics;  Laterality: Left;  . TONSILLECTOMY AND ADENOIDECTOMY  12/2015    SOCIAL HISTORY: Social History   Tobacco Use  . Smoking status: Never Smoker  . Smokeless tobacco: Never Used  Substance Use Topics  . Alcohol use: No  . Drug use: No    FAMILY HISTORY: Family History  Problem Relation Age of Onset  . Diabetes Mother   . Hypertension Mother   . Anxiety disorder Mother   . Obesity Father   . Hyperlipidemia Father   . Hypertension Father   . Diabetes Father  ROS: Review of Systems  Constitutional: Positive for malaise/fatigue.  Cardiovascular: Negative for palpitations.  Gastrointestinal: Negative for nausea and vomiting.  Musculoskeletal:       Negative for muscle weakness  Endo/Heme/Allergies:       Negative for heat or cold intolerance    PHYSICAL EXAM: Blood pressure 94/62, pulse 82, temperature 97.8 F (36.6 C), height 5\' 7"  (1.702 m), weight 291 lb (132 kg), SpO2 98 %. Body mass index is 45.58 kg/m. Physical Exam Vitals signs reviewed.  Constitutional:      Appearance: Normal appearance. She is well-developed. She is obese.  Cardiovascular:     Rate and Rhythm: Normal rate.  Pulmonary:     Effort: Pulmonary effort is normal.  Musculoskeletal: Normal range of motion.  Skin:    General: Skin is warm and dry.  Neurological:     Mental Status: She is alert and oriented to person, place, and time.  Psychiatric:        Mood and Affect:  Mood normal.        Behavior: Behavior normal.     RECENT LABS AND TESTS: BMET No results found for: NA, K, CL, CO2, GLUCOSE, BUN, CREATININE, CALCIUM, GFRNONAA, GFRAA Lab Results  Component Value Date   HGBA1C 5.4 10/25/2018   HGBA1C 5.6 07/14/2018   Lab Results  Component Value Date   INSULIN 52.2 (H) 10/25/2018   INSULIN 35.5 (H) 07/14/2018   CBC No results found for: WBC, RBC, HGB, HCT, PLT, MCV, MCH, MCHC, RDW, LYMPHSABS, MONOABS, EOSABS, BASOSABS Iron/TIBC/Ferritin/ %Sat No results found for: IRON, TIBC, FERRITIN, IRONPCTSAT Lipid Panel  No results found for: CHOL, TRIG, HDL, CHOLHDL, VLDL, LDLCALC, LDLDIRECT Hepatic Function Panel  No results found for: PROT, ALBUMIN, AST, ALT, ALKPHOS, BILITOT, BILIDIR, IBILI    Component Value Date/Time   TSH WILL FOLLOW 10/25/2018 1011      OBESITY BEHAVIORAL INTERVENTION VISIT  Today's visit was # 7   Starting weight: 304 lbs Starting date: 07/14/2018 Today's weight : 291 lbs Today's date: 10/25/2018 Total lbs lost to date: 13    10/25/2018  Height 5\' 7"  (1.702 m)  Weight 291 lb (132 kg)  BMI (Calculated) 45.57  BLOOD PRESSURE - SYSTOLIC 94  BLOOD PRESSURE - DIASTOLIC 62   Body Fat % 02.6 %  Total Body Water (lbs) 111.6 lbs    ASK: We discussed the diagnosis of obesity with Janice Wolf today and Janice Wolf agreed to give [place] permission to discuss obesity behavioral modification therapy today.  ASSESS: Janice Wolf has the diagnosis of obesity and her BMI today is 45.57 Janice Wolf is in the action stage of change   ADVISE: Janice Wolf was educated on the multiple health risks of obesity as well as the benefit of weight loss to improve her health. She was advised of the need for long term treatment and the importance of lifestyle modifications to improve her current health and to decrease her risk of future health problems.  AGREE: Multiple dietary modification options and treatment options were discussed and  Janice Wolf  agreed to follow the recommendations documented in the above note.  ARRANGE: Janice Wolf was educated on the importance of frequent visits to treat obesity as outlined per CMS and USPSTF guidelines and agreed to schedule her next follow up appointment today.  I, Doreene Nest, am acting as transcriptionist for Eber Jones, MD  I have reviewed the above documentation for accuracy and completeness, and I agree with the above. - Ilene Qua, MD

## 2018-11-10 ENCOUNTER — Telehealth (INDEPENDENT_AMBULATORY_CARE_PROVIDER_SITE_OTHER): Payer: 59 | Admitting: Family Medicine

## 2018-11-17 ENCOUNTER — Other Ambulatory Visit: Payer: Self-pay

## 2018-11-17 ENCOUNTER — Encounter (INDEPENDENT_AMBULATORY_CARE_PROVIDER_SITE_OTHER): Payer: Self-pay | Admitting: Family Medicine

## 2018-11-17 ENCOUNTER — Telehealth (INDEPENDENT_AMBULATORY_CARE_PROVIDER_SITE_OTHER): Payer: 59 | Admitting: Family Medicine

## 2018-11-17 DIAGNOSIS — Z6841 Body Mass Index (BMI) 40.0 and over, adult: Secondary | ICD-10-CM | POA: Diagnosis not present

## 2018-11-17 DIAGNOSIS — E559 Vitamin D deficiency, unspecified: Secondary | ICD-10-CM | POA: Diagnosis not present

## 2018-11-17 DIAGNOSIS — E8881 Metabolic syndrome: Secondary | ICD-10-CM | POA: Diagnosis not present

## 2018-11-17 MED ORDER — VITAMIN D (ERGOCALCIFEROL) 1.25 MG (50000 UNIT) PO CAPS: 50000.0000 [IU] | ORAL_CAPSULE | ORAL | 0 refills | Status: DC

## 2018-11-22 NOTE — Progress Notes (Signed)
Office: [phone]  /  Fax: [phone] TeleHealth Visit:  Janice Wolf has verbally consented to this TeleHealth visit today. The patient is located at home, the provider is located at the [facility] office. The participants in this visit include the listed provider, patient, and the patient's husband, Grayland Ormond. The visit was conducted today via FaceTime.  HPI:   Chief Complaint: OBESITY Jaden is here to discuss her progress with her obesity treatment plan. She is on the Category 3 plan and journaling and is following her eating plan approximately 80% of the time. She states she is walking 30 minutes 3-4 times per week. Janice Wolf reports her weight to be 290.5 lbs. She voices that she and her husband have been more on track with meal planning and is still working on eating everything. She states she hasn't done any journaling secondary to fatigue. We were unable to weigh the patient today for this TeleHealth visit. She feels as if she has maintained her weight since her last visit. She has lost 13 lbs since starting treatment with [place].  Vitamin D deficiency Janice Wolf has a diagnosis of Vitamin D deficiency. She is currently taking prescription Vit D and denies nausea, vomiting or muscle weakness but does report fatigue.  Insulin Resistance Janice Wolf has a diagnosis of insulin resistance based on her elevated fasting insulin level >5. Her insulin is slightly high at 52.2 but her HgA1c is down from 5.6 to 5.4. Although Janice Wolf's blood glucose readings are still under good control, insulin resistance puts her at greater risk of metabolic syndrome and diabetes. She is not taking metformin currently and continues to work on diet and exercise to decrease risk of diabetes.  ASSESSMENT AND PLAN:  Insulin resistance  Vitamin D deficiency - Plan: Vitamin D, Ergocalciferol, (DRISDOL) 1.25 MG (50000 UT) CAPS capsule  Class 3 severe obesity with serious comorbidity and body mass  index (BMI) of 45.0 to 49.9 in adult, unspecified obesity type ([place])  PLAN:  Vitamin D Deficiency Janice Wolf was informed that low Vitamin D levels contributes to fatigue and are associated with obesity, breast, and colon cancer. She agrees to continue to take prescription Vit D @ 50,000 IU every week and will follow-up for routine testing of Vitamin D, at least 2-3 times per year. She was informed of the risk of over-replacement of Vitamin D and agrees to not increase her dose unless she discusses this with [place] first. Janice Wolf agrees to follow-up with our clinic in 2 weeks.  Insulin Resistance Janice Wolf will continue to work on weight loss, exercise, and decreasing simple carbohydrates in her diet to help decrease the risk of diabetes. We dicussed metformin including benefits and risks. She was informed that eating too many simple carbohydrates or too many calories at one sitting increases the likelihood of GI side effects. [name] will have A1c and insulin retested in 2.5 months. We will consider metformin if her labs are not improved.  Obesity Janice Wolf is currently in the action stage of change. As such, her goal is to continue with weight loss efforts. She has agreed to follow the Category 3 plan. Janice Wolf has been instructed to work up to a goal of 150 minutes of combined cardio and strengthening exercise per week for weight loss and overall health benefits. We discussed the following Behavioral Modification Strategies today: increasing lean protein intake, increasing vegetables, work on meal planning and easy cooking plans, keeping healthy foods in the home, and planning for success.  Janice Wolf has agreed to  follow-up with our clinic in 2 weeks. She was informed of the importance of frequent follow-up visits to maximize her success with intensive lifestyle modifications for her multiple health conditions.  ALLERGIES: Allergies  Allergen Reactions  . Aspirin Nausea And Vomiting  . Latex Other  (See Comments)    SKIN IRRITATION  . Other Rash    PERFUMES    MEDICATIONS: Current Outpatient Medications on File Prior to Visit  Medication Sig Dispense Refill  . esomeprazole (NEXIUM) 20 MG capsule Take 20 mg by mouth daily at 12 noon.    [name] levonorgestrel (MIRENA) 20 MCG/24HR IUD 1 each by Intrauterine route once.    [name] levothyroxine (SYNTHROID, LEVOTHROID) 25 MCG tablet Take 50 mcg by mouth daily before breakfast.     . Multiple Vitamins-Minerals (MULTIVITAMIN WITH MINERALS) tablet Take 1 tablet by mouth daily.     No current facility-administered medications on file prior to visit.     PAST MEDICAL HISTORY: Past Medical History:  Diagnosis Date  . Abnormal thyroid blood test    no treatment yet, per pt.  . Anxiety   . Back pain   . Complication of anesthesia    was hard to wake up after T & A 12/2015; had to stay overnight  . Dental crowns present   . Depression   . Dizziness   . Fatty liver   . Gallbladder problem   . GERD (gastroesophageal reflux disease)   . Hypothyroidism   . IBS (irritable bowel syndrome)   . Migraines    migraines  . Painful orthopaedic hardware ([place]) 09/2016   left foot  . PONV (postoperative nausea and vomiting)   . Stuffy nose 09/03/2016  . Vitamin D deficiency     PAST SURGICAL HISTORY: Past Surgical History:  Procedure Laterality Date  . CHOLECYSTECTOMY     age 36  . HARDWARE REMOVAL Left 10/01/2016   Procedure: HARDWARE REMOVAL left foot;  Surgeon: [name], MD;  Location: [place];  Service: Orthopedics;  Laterality: Left;  . TARSAL METATARSAL ARTHRODESIS Left 02/20/2016   Procedure: FIRST AND SECOND TARSAL METATARSAL ARTHRODESIS;  Surgeon: [name], MD;  Location: [place];  Service: Orthopedics;  Laterality: Left;  . TONSILLECTOMY AND ADENOIDECTOMY  12/2015    SOCIAL HISTORY: Social History   Tobacco Use  . Smoking status: Never Smoker  . Smokeless tobacco: Never Used  Substance Use  Topics  . Alcohol use: No  . Drug use: No    FAMILY HISTORY: Family History  Problem Relation Age of Onset  . Diabetes Mother   . Hypertension Mother   . Anxiety disorder Mother   . Obesity Father   . Hyperlipidemia Father   . Hypertension Father   . Diabetes Father    ROS: Review of Systems  Constitutional: Positive for malaise/fatigue.  Gastrointestinal: Negative for nausea and vomiting.  Musculoskeletal:       Negative for muscle weakness.   PHYSICAL EXAM: Pt in no acute distress  RECENT LABS AND TESTS: BMET No results found for: NA, K, CL, CO2, GLUCOSE, BUN, CREATININE, CALCIUM, GFRNONAA, GFRAA Lab Results  Component Value Date   HGBA1C 5.4 10/25/2018   HGBA1C 5.6 07/14/2018   Lab Results  Component Value Date   INSULIN 52.2 (H) 10/25/2018   INSULIN 35.5 (H) 07/14/2018   CBC No results found for: WBC, RBC, HGB, HCT, PLT, MCV, MCH, MCHC, RDW, LYMPHSABS, MONOABS, EOSABS, BASOSABS Iron/TIBC/Ferritin/ %Sat No results found for: IRON, TIBC, FERRITIN, IRONPCTSAT Lipid  Panel  No results found for: CHOL, TRIG, HDL, CHOLHDL, VLDL, LDLCALC, LDLDIRECT Hepatic Function Panel  No results found for: PROT, ALBUMIN, AST, ALT, ALKPHOS, BILITOT, BILIDIR, IBILI    Component Value Date/Time   TSH 2.750 10/25/2018 1011   Results for ESSENCE, MERLE (MRN [id number]) as of 11/22/2018 08:45  Ref. Range 10/25/2018 10:11  Vitamin D, 25-Hydroxy Latest Ref Range: 30.0 - 100.0 ng/mL 33.1   I, Michaelene Song, am acting as Location manager for Ilene Qua, MD  I have reviewed the above documentation for accuracy and completeness, and I agree with the above. - Ilene Qua, MD

## 2018-12-01 ENCOUNTER — Encounter (INDEPENDENT_AMBULATORY_CARE_PROVIDER_SITE_OTHER): Payer: Self-pay | Admitting: Family Medicine

## 2018-12-01 ENCOUNTER — Telehealth (INDEPENDENT_AMBULATORY_CARE_PROVIDER_SITE_OTHER): Payer: 59 | Admitting: Family Medicine

## 2018-12-01 ENCOUNTER — Other Ambulatory Visit: Payer: Self-pay

## 2018-12-01 DIAGNOSIS — Z6841 Body Mass Index (BMI) 40.0 and over, adult: Secondary | ICD-10-CM | POA: Diagnosis not present

## 2018-12-01 DIAGNOSIS — E559 Vitamin D deficiency, unspecified: Secondary | ICD-10-CM | POA: Diagnosis not present

## 2018-12-01 DIAGNOSIS — E8881 Metabolic syndrome: Secondary | ICD-10-CM

## 2018-12-04 NOTE — Progress Notes (Signed)
Office: [phone]  /  Fax: [phone] TeleHealth Visit:  Janice Wolf has verbally consented to this TeleHealth visit today. The patient is located at home, the provider is located at the [facility] office. The participants in this visit include the listed provider and patient's husband. The visit was conducted today via face time.  HPI:   Chief Complaint: OBESITY Janice Wolf is here to discuss her progress with her obesity treatment plan. She is on the Category 3 plan and is following her eating plan approximately 50 % of the time. She states she is active while working. Janice Wolf's weight is of 289 lbs today (1 lb down from last visit). Her best friend was killed a week and a half ago, which has been emotionally difficult for her.  We were unable to weigh the patient today for this TeleHealth visit. She feels as if she has lost 1 lb since her last visit. She has lost 13 lbs since starting treatment with [place].  Vitamin D Deficiency Janice Wolf has a diagnosis of vitamin D deficiency. She is currently taking prescription Vit D. She notes fatigue and denies nausea, vomiting or muscle weakness.  Insulin Resistance Janice Wolf has a diagnosis of insulin resistance based on her elevated fasting insulin level >5. Although Janice Wolf's blood glucose readings are still under good control, insulin resistance puts her at greater risk of metabolic syndrome and diabetes. She notes occasional carbohydrate cravings and she is not on metformin. She continues to work on diet and exercise to decrease risk of diabetes.  ASSESSMENT AND PLAN:  Vitamin D deficiency  Insulin resistance  Class 3 severe obesity with serious comorbidity and body mass index (BMI) of 45.0 to 49.9 in adult, unspecified obesity type ([place])  PLAN:  Vitamin D Deficiency [name] was informed that low vitamin D levels contributes to fatigue and are associated with obesity, breast, and colon cancer. [name] agrees to  continue taking prescription Vit D 50,000 IU every week, no refill needed. She will follow up for routine testing of vitamin D, at least 2-3 times per year. She was informed of the risk of over-replacement of vitamin D and agrees to not increase her dose unless she discusses this with [place] first. [name] agrees to follow up with our clinic in 2 weeks.  Insulin Resistance [name] will continue to work on weight loss, exercise, and decreasing simple carbohydrates in her diet to help decrease the risk of diabetes. We dicussed metformin including benefits and risks. She was informed that eating too many simple carbohydrates or too many calories at one sitting increases the likelihood of GI side effects. Janice Wolf declined metformin for now and prescription was not written today. We will repeat labs in early October. Janice Wolf agrees to follow up with our clinic in 2 weeks as directed to monitor her progress.  Obesity Janice Wolf is currently in the action stage of change. As such, her goal is to continue with weight loss efforts She has agreed to follow the Category 4 plan Janice Wolf has been instructed to work up to a goal of 150 minutes of combined cardio and strengthening exercise per week for weight loss and overall health benefits. We discussed the following Behavioral Modification Strategies today: increasing lean protein intake, increasing vegetables and work on meal planning and easy cooking plans, keeping healthy foods in the home, and planning for success   [name] has agreed to follow up with our clinic in 2 weeks. She was informed of the importance of frequent follow up visits  to maximize her success with intensive lifestyle modifications for her multiple health conditions.  ALLERGIES: Allergies  Allergen Reactions  . Aspirin Nausea And Vomiting  . Latex Other (See Comments)    SKIN IRRITATION  . Other Rash    PERFUMES    MEDICATIONS: Current Outpatient Medications on File Prior to Visit   Medication Sig Dispense Refill  . esomeprazole (NEXIUM) 20 MG capsule Take 20 mg by mouth daily at 12 noon.    [name] levonorgestrel (MIRENA) 20 MCG/24HR IUD 1 each by Intrauterine route once.    [name] levothyroxine (SYNTHROID, LEVOTHROID) 25 MCG tablet Take 50 mcg by mouth daily before breakfast.     . Multiple Vitamins-Minerals (MULTIVITAMIN WITH MINERALS) tablet Take 1 tablet by mouth daily.    . Vitamin D, Ergocalciferol, (DRISDOL) 1.25 MG (50000 UT) CAPS capsule Take 1 capsule (50,000 Units total) by mouth every 7 (seven) days. 4 capsule 0   No current facility-administered medications on file prior to visit.     PAST MEDICAL HISTORY: Past Medical History:  Diagnosis Date  . Abnormal thyroid blood test    no treatment yet, per pt.  . Anxiety   . Back pain   . Complication of anesthesia    was hard to wake up after T & A 12/2015; had to stay overnight  . Dental crowns present   . Depression   . Dizziness   . Fatty liver   . Gallbladder problem   . GERD (gastroesophageal reflux disease)   . Hypothyroidism   . IBS (irritable bowel syndrome)   . Migraines    migraines  . Painful orthopaedic hardware ([place]) 09/2016   left foot  . PONV (postoperative nausea and vomiting)   . Stuffy nose 09/03/2016  . Vitamin D deficiency     PAST SURGICAL HISTORY: Past Surgical History:  Procedure Laterality Date  . CHOLECYSTECTOMY     age 59  . HARDWARE REMOVAL Left 10/01/2016   Procedure: HARDWARE REMOVAL left foot;  Surgeon: [name], MD;  Location: [place];  Service: Orthopedics;  Laterality: Left;  . TARSAL METATARSAL ARTHRODESIS Left 02/20/2016   Procedure: FIRST AND SECOND TARSAL METATARSAL ARTHRODESIS;  Surgeon: [name], MD;  Location: [place];  Service: Orthopedics;  Laterality: Left;  . TONSILLECTOMY AND ADENOIDECTOMY  12/2015    SOCIAL HISTORY: Social History   Tobacco Use  . Smoking status: Never Smoker  . Smokeless tobacco: Never Used   Substance Use Topics  . Alcohol use: No  . Drug use: No    FAMILY HISTORY: Family History  Problem Relation Age of Onset  . Diabetes Mother   . Hypertension Mother   . Anxiety disorder Mother   . Obesity Father   . Hyperlipidemia Father   . Hypertension Father   . Diabetes Father     ROS: Review of Systems  Constitutional: Positive for malaise/fatigue and weight loss.  Gastrointestinal: Negative for nausea and vomiting.  Musculoskeletal:       Negative muscle weakness    PHYSICAL EXAM: Pt in no acute distress  RECENT LABS AND TESTS: BMET No results found for: NA, K, CL, CO2, GLUCOSE, BUN, CREATININE, CALCIUM, GFRNONAA, GFRAA Lab Results  Component Value Date   HGBA1C 5.4 10/25/2018   HGBA1C 5.6 07/14/2018   Lab Results  Component Value Date   INSULIN 52.2 (H) 10/25/2018   INSULIN 35.5 (H) 07/14/2018   CBC No results found for: WBC, RBC, HGB, HCT, PLT, MCV, MCH, MCHC, RDW, LYMPHSABS,  MONOABS, EOSABS, BASOSABS Iron/TIBC/Ferritin/ %Sat No results found for: IRON, TIBC, FERRITIN, IRONPCTSAT Lipid Panel  No results found for: CHOL, TRIG, HDL, CHOLHDL, VLDL, LDLCALC, LDLDIRECT Hepatic Function Panel  No results found for: PROT, ALBUMIN, AST, ALT, ALKPHOS, BILITOT, BILIDIR, IBILI    Component Value Date/Time   TSH 2.750 10/25/2018 1011      I, Trixie Dredge, am acting as transcriptionist for Ilene Qua, MD   I have reviewed the above documentation for accuracy and completeness, and I agree with the above. - Ilene Qua, MD

## 2018-12-20 ENCOUNTER — Encounter (INDEPENDENT_AMBULATORY_CARE_PROVIDER_SITE_OTHER): Payer: Self-pay | Admitting: Family Medicine

## 2018-12-20 ENCOUNTER — Other Ambulatory Visit: Payer: Self-pay

## 2018-12-20 ENCOUNTER — Telehealth (INDEPENDENT_AMBULATORY_CARE_PROVIDER_SITE_OTHER): Payer: 59 | Admitting: Family Medicine

## 2018-12-20 DIAGNOSIS — E7849 Other hyperlipidemia: Secondary | ICD-10-CM | POA: Diagnosis not present

## 2018-12-20 DIAGNOSIS — E559 Vitamin D deficiency, unspecified: Secondary | ICD-10-CM

## 2018-12-20 DIAGNOSIS — Z6841 Body Mass Index (BMI) 40.0 and over, adult: Secondary | ICD-10-CM

## 2018-12-20 MED ORDER — VITAMIN D (ERGOCALCIFEROL) 1.25 MG (50000 UNIT) PO CAPS
50000.0000 [IU] | ORAL_CAPSULE | ORAL | 0 refills | Status: DC
Start: 2018-12-20 — End: 2019-01-31

## 2018-12-27 NOTE — Progress Notes (Signed)
Office: [phone]  /  Fax: [phone] TeleHealth Visit:  Janice Wolf has verbally consented to this TeleHealth visit today. The patient is located at home, the provider is located at the [facility] office. The participants in this visit include the listed provider and patient and patient's husband. The visit was conducted today via face time.  HPI:   Chief Complaint: OBESITY Janice Wolf is here to discuss her progress with her obesity treatment plan. She is on the Category 4 plan and is following her eating plan approximately 80 % of the time. She states she is walking for 30 minutes 3 times per week. Janice Wolf reports no weight loss or weight gain, and she is journaling 80 % of the time (did not report weight today) Last reported weight was 289 lbs at last visit. She has been counting calories and voices she knows she is under-eating. She is working 8-16 hours overtime per week.  We were unable to weigh the patient today for this TeleHealth visit. She feels as if she has maintained her weight since her last visit. She has lost 13 lbs since starting treatment with [place].  Vitamin D Deficiency Janice Wolf has a diagnosis of vitamin D deficiency. She is currently taking prescription Vit D. She notes fatigue and denies nausea, vomiting or muscle weakness.  Hyperlipidemia Janice Wolf has hyperlipidemia and has been trying to improve her cholesterol levels with intensive lifestyle modification including a low saturated fat diet, exercise and weight loss. Last LDL was of 122 on 04/21/18. No recent repeat of FLP and she is not on statin. She denies any chest pain, claudication or myalgias.  ASSESSMENT AND PLAN:  Other hyperlipidemia  Vitamin D deficiency - Plan: Vitamin D, Ergocalciferol, (DRISDOL) 1.25 MG (50000 UT) CAPS capsule  Class 3 severe obesity with serious comorbidity and body mass index (BMI) of 45.0 to 49.9 in adult, unspecified obesity type ([place])  PLAN:  Vitamin D  Deficiency Janice Wolf was informed that low vitamin D levels contributes to fatigue and are associated with obesity, breast, and colon cancer. Janice Wolf agrees to continue taking prescription Vit D 50,000 IU every week #4 and we will refill for 1 month. She will follow up for routine testing of vitamin D, at least 2-3 times per year. She was informed of the risk of over-replacement of vitamin D and agrees to not increase her dose unless she discusses this with [place] first. Janice Wolf agrees to follow up with our clinic in 2 weeks.  Hyperlipidemia Janice Wolf was informed of the American Heart Association Guidelines emphasizing intensive lifestyle modifications as the first line treatment for hyperlipidemia. We discussed many lifestyle modifications today in depth, and Janice Wolf will continue to work on decreasing saturated fats such as fatty red meat, butter and many fried foods. She will also increase vegetables and lean protein in her diet and continue to work on exercise and weight loss efforts. We will repeat FLP at her next lab draw. Janice Wolf agrees to follow up with our clinic in 2 weeks.  Obesity Janice Wolf is currently in the action stage of change. As such, her goal is to continue with weight loss efforts She has agreed to follow the Category 4 plan Janice Wolf has been instructed to work up to a goal of 150 minutes of combined cardio and strengthening exercise per week for weight loss and overall health benefits. We discussed the following Behavioral Modification Strategies today: increasing lean protein intake, increasing vegetables and work on meal planning and easy cooking plans, keeping  healthy foods in the home, and planning for success   Janice Wolf has agreed to follow up with our clinic in 2 weeks. She was informed of the importance of frequent follow up visits to maximize her success with intensive lifestyle modifications for her multiple health conditions.  ALLERGIES: Allergies  Allergen Reactions   . Aspirin Nausea And Vomiting  . Latex Other (See Comments)    SKIN IRRITATION  . Other Rash    PERFUMES    MEDICATIONS: Current Outpatient Medications on File Prior to Visit  Medication Sig Dispense Refill  . esomeprazole (NEXIUM) 20 MG capsule Take 20 mg by mouth daily at 12 noon.    [name] levonorgestrel (MIRENA) 20 MCG/24HR IUD 1 each by Intrauterine route once.    [name] levothyroxine (SYNTHROID, LEVOTHROID) 25 MCG tablet Take 50 mcg by mouth daily before breakfast.     . Multiple Vitamins-Minerals (MULTIVITAMIN WITH MINERALS) tablet Take 1 tablet by mouth daily.     No current facility-administered medications on file prior to visit.     PAST MEDICAL HISTORY: Past Medical History:  Diagnosis Date  . Abnormal thyroid blood test    no treatment yet, per pt.  . Anxiety   . Back pain   . Complication of anesthesia    was hard to wake up after T & A 12/2015; had to stay overnight  . Dental crowns present   . Depression   . Dizziness   . Fatty liver   . Gallbladder problem   . GERD (gastroesophageal reflux disease)   . Hypothyroidism   . IBS (irritable bowel syndrome)   . Migraines    migraines  . Painful orthopaedic hardware ([place]) 09/2016   left foot  . PONV (postoperative nausea and vomiting)   . Stuffy nose 09/03/2016  . Vitamin D deficiency     PAST SURGICAL HISTORY: Past Surgical History:  Procedure Laterality Date  . CHOLECYSTECTOMY     age 70  . HARDWARE REMOVAL Left 10/01/2016   Procedure: HARDWARE REMOVAL left foot;  Surgeon: [name], MD;  Location: [place];  Service: Orthopedics;  Laterality: Left;  . TARSAL METATARSAL ARTHRODESIS Left 02/20/2016   Procedure: FIRST AND SECOND TARSAL METATARSAL ARTHRODESIS;  Surgeon: [name], MD;  Location: [place];  Service: Orthopedics;  Laterality: Left;  . TONSILLECTOMY AND ADENOIDECTOMY  12/2015    SOCIAL HISTORY: Social History   Tobacco Use  . Smoking status: Never Smoker   . Smokeless tobacco: Never Used  Substance Use Topics  . Alcohol use: No  . Drug use: No    FAMILY HISTORY: Family History  Problem Relation Age of Onset  . Diabetes Mother   . Hypertension Mother   . Anxiety disorder Mother   . Obesity Father   . Hyperlipidemia Father   . Hypertension Father   . Diabetes Father     ROS: Review of Systems  Constitutional: Positive for malaise/fatigue. Negative for weight loss.  Cardiovascular: Negative for chest pain and claudication.  Gastrointestinal: Negative for nausea and vomiting.  Musculoskeletal: Negative for myalgias.       Negative muscle weakness    PHYSICAL EXAM: Pt in no acute distress  RECENT LABS AND TESTS: BMET No results found for: NA, K, CL, CO2, GLUCOSE, BUN, CREATININE, CALCIUM, GFRNONAA, GFRAA Lab Results  Component Value Date   HGBA1C 5.4 10/25/2018   HGBA1C 5.6 07/14/2018   Lab Results  Component Value Date   INSULIN 52.2 (H) 10/25/2018   INSULIN 35.5 (  H) 07/14/2018   CBC No results found for: WBC, RBC, HGB, HCT, PLT, MCV, MCH, MCHC, RDW, LYMPHSABS, MONOABS, EOSABS, BASOSABS Iron/TIBC/Ferritin/ %Sat No results found for: IRON, TIBC, FERRITIN, IRONPCTSAT Lipid Panel  No results found for: CHOL, TRIG, HDL, CHOLHDL, VLDL, LDLCALC, LDLDIRECT Hepatic Function Panel  No results found for: PROT, ALBUMIN, AST, ALT, ALKPHOS, BILITOT, BILIDIR, IBILI    Component Value Date/Time   TSH 2.750 10/25/2018 1011      I, Trixie Dredge, am acting as transcriptionist for Ilene Qua, MD  I have reviewed the above documentation for accuracy and completeness, and I agree with the above. - Ilene Qua, MD

## 2019-01-03 ENCOUNTER — Other Ambulatory Visit: Payer: Self-pay

## 2019-01-03 ENCOUNTER — Ambulatory Visit (INDEPENDENT_AMBULATORY_CARE_PROVIDER_SITE_OTHER): Payer: 59 | Admitting: Family Medicine

## 2019-01-03 ENCOUNTER — Encounter (INDEPENDENT_AMBULATORY_CARE_PROVIDER_SITE_OTHER): Payer: Self-pay | Admitting: Family Medicine

## 2019-01-03 DIAGNOSIS — E559 Vitamin D deficiency, unspecified: Secondary | ICD-10-CM

## 2019-01-03 DIAGNOSIS — Z6841 Body Mass Index (BMI) 40.0 and over, adult: Secondary | ICD-10-CM | POA: Diagnosis not present

## 2019-01-03 DIAGNOSIS — E038 Other specified hypothyroidism: Secondary | ICD-10-CM

## 2019-01-03 NOTE — Progress Notes (Signed)
Office: [phone]  /  Fax: [phone] TeleHealth Visit:  [name] has verbally consented to this TeleHealth visit today. The patient is located at home, the provider is located at the [facility] office. The participants in this visit include the listed provider and patient and patient's husband. The visit was conducted today via face time.  HPI:   Chief Complaint: OBESITY Janice Wolf is here to discuss her progress with her obesity treatment plan. She is on the Category 4 plan and is following her eating plan approximately 80 % of the time. She states she is walking 1.5 miles 2 times per week. Theia hasn't been journaling but trying to be mindful of calories, unsure of number of calories a day and unsure amount of protein. Her weight was of 289 lbs today. She has no plans for the upcoming weeks or obstacles.  We were unable to weigh the patient today for this TeleHealth visit. She feels as if she has maintained her weight since her last visit. She has lost 13 lbs since starting treatment with [place].  Vitamin D Deficiency Janice Wolf has a diagnosis of vitamin D deficiency. She is currently taking prescription Vit D. She notes fatigue and denies nausea, vomiting or muscle weakness.  Hypothyroidism Janice Wolf has a diagnosis of hypothyroidism. She is currently on levothyroxine. She denies hot or cold intolerance or palpitations, but does admit to ongoing fatigue.  ASSESSMENT AND PLAN:  Vitamin D deficiency  Other specified hypothyroidism  Class 3 severe obesity with serious comorbidity and body mass index (BMI) of 45.0 to 49.9 in adult, unspecified obesity type ([place])  PLAN:  Vitamin D Deficiency Janice Wolf was informed that low vitamin D levels contributes to fatigue and are associated with obesity, breast, and colon cancer. Janice Wolf agrees to continue taking prescription Vit D 50,000 IU every week, no refill needed. She will follow up for routine testing of vitamin  D, at least 2-3 times per year. She was informed of the risk of over-replacement of vitamin D and agrees to not increase her dose unless she discusses this with [place] first. Janice Wolf agrees to follow up with our clinic in 3 weeks.  Hypothyroidism Janice Wolf was informed of the importance of good thyroid control to help with weight loss efforts. She was also informed that supertheraputic thyroid levels are dangerous and will not improve weight loss results. Janice Wolf agrees to continue taking levothyroxine, and she is to get labs at her primary care physician's office. Janice Wolf agrees to follow up with our clinic in 3 weeks.  Obesity Janice Wolf is currently in the action stage of change. As such, her goal is to continue with weight loss efforts She has agreed to keep a food journal with 1750-1900 calories and 125+ grams of protein daily Janice Wolf has been instructed to work up to a goal of 150 minutes of combined cardio and strengthening exercise per week for weight loss and overall health benefits. We discussed the following Behavioral Modification Strategies today: increasing lean protein intake, increasing vegetables and work on meal planning and easy cooking plans, keeping healthy foods in the home, and planning for success   Janice Wolf has agreed to follow up with our clinic in 3 weeks. She was informed of the importance of frequent follow up visits to maximize her success with intensive lifestyle modifications for her multiple health conditions.  ALLERGIES: Allergies  Allergen Reactions  . Aspirin Nausea And Vomiting  . Latex Other (See Comments)    SKIN IRRITATION  . Other Rash  PERFUMES    MEDICATIONS: Current Outpatient Medications on File Prior to Visit  Medication Sig Dispense Refill  . esomeprazole (NEXIUM) 20 MG capsule Take 20 mg by mouth daily at 12 noon.    [name] levonorgestrel (MIRENA) 20 MCG/24HR IUD 1 each by Intrauterine route once.    [name] levothyroxine (SYNTHROID, LEVOTHROID) 25  MCG tablet Take 50 mcg by mouth daily before breakfast.     . Multiple Vitamins-Minerals (MULTIVITAMIN WITH MINERALS) tablet Take 1 tablet by mouth daily.    . Vitamin D, Ergocalciferol, (DRISDOL) 1.25 MG (50000 UT) CAPS capsule Take 1 capsule (50,000 Units total) by mouth every 7 (seven) days. 4 capsule 0   No current facility-administered medications on file prior to visit.     PAST MEDICAL HISTORY: Past Medical History:  Diagnosis Date  . Abnormal thyroid blood test    no treatment yet, per pt.  . Anxiety   . Back pain   . Complication of anesthesia    was hard to wake up after T & A 12/2015; had to stay overnight  . Dental crowns present   . Depression   . Dizziness   . Fatty liver   . Gallbladder problem   . GERD (gastroesophageal reflux disease)   . Hypothyroidism   . IBS (irritable bowel syndrome)   . Migraines    migraines  . Painful orthopaedic hardware ([place]) 09/2016   left foot  . PONV (postoperative nausea and vomiting)   . Stuffy nose 09/03/2016  . Vitamin D deficiency     PAST SURGICAL HISTORY: Past Surgical History:  Procedure Laterality Date  . CHOLECYSTECTOMY     age 29  . HARDWARE REMOVAL Left 10/01/2016   Procedure: HARDWARE REMOVAL left foot;  Surgeon: [name], MD;  Location: [place];  Service: Orthopedics;  Laterality: Left;  . TARSAL METATARSAL ARTHRODESIS Left 02/20/2016   Procedure: FIRST AND SECOND TARSAL METATARSAL ARTHRODESIS;  Surgeon: [name], MD;  Location: [place];  Service: Orthopedics;  Laterality: Left;  . TONSILLECTOMY AND ADENOIDECTOMY  12/2015    SOCIAL HISTORY: Social History   Tobacco Use  . Smoking status: Never Smoker  . Smokeless tobacco: Never Used  Substance Use Topics  . Alcohol use: No  . Drug use: No    FAMILY HISTORY: Family History  Problem Relation Age of Onset  . Diabetes Mother   . Hypertension Mother   . Anxiety disorder Mother   . Obesity Father   .  Hyperlipidemia Father   . Hypertension Father   . Diabetes Father     ROS: Review of Systems  Constitutional: Positive for malaise/fatigue. Negative for weight loss.  Cardiovascular: Negative for palpitations.  Gastrointestinal: Negative for nausea and vomiting.  Musculoskeletal:       Negative muscle weakness  Endo/Heme/Allergies:       Negative hot/cold intolerance    PHYSICAL EXAM: Pt in no acute distress  RECENT LABS AND TESTS: BMET No results found for: NA, K, CL, CO2, GLUCOSE, BUN, CREATININE, CALCIUM, GFRNONAA, GFRAA Lab Results  Component Value Date   HGBA1C 5.4 10/25/2018   HGBA1C 5.6 07/14/2018   Lab Results  Component Value Date   INSULIN 52.2 (H) 10/25/2018   INSULIN 35.5 (H) 07/14/2018   CBC No results found for: WBC, RBC, HGB, HCT, PLT, MCV, MCH, MCHC, RDW, LYMPHSABS, MONOABS, EOSABS, BASOSABS Iron/TIBC/Ferritin/ %Sat No results found for: IRON, TIBC, FERRITIN, IRONPCTSAT Lipid Panel  No results found for: CHOL, TRIG, HDL, CHOLHDL, VLDL, LDLCALC,  LDLDIRECT Hepatic Function Panel  No results found for: PROT, ALBUMIN, AST, ALT, ALKPHOS, BILITOT, BILIDIR, IBILI    Component Value Date/Time   TSH 2.750 10/25/2018 1011      I, Trixie Dredge, am acting as transcriptionist for Ilene Qua, MD  I have reviewed the above documentation for accuracy and completeness, and I agree with the above. - Ilene Qua, MD

## 2019-01-31 ENCOUNTER — Other Ambulatory Visit: Payer: Self-pay

## 2019-01-31 ENCOUNTER — Encounter (INDEPENDENT_AMBULATORY_CARE_PROVIDER_SITE_OTHER): Payer: Self-pay | Admitting: Family Medicine

## 2019-01-31 ENCOUNTER — Ambulatory Visit (INDEPENDENT_AMBULATORY_CARE_PROVIDER_SITE_OTHER): Payer: 59 | Admitting: Family Medicine

## 2019-01-31 DIAGNOSIS — E038 Other specified hypothyroidism: Secondary | ICD-10-CM | POA: Diagnosis not present

## 2019-01-31 DIAGNOSIS — Z6841 Body Mass Index (BMI) 40.0 and over, adult: Secondary | ICD-10-CM

## 2019-01-31 DIAGNOSIS — E559 Vitamin D deficiency, unspecified: Secondary | ICD-10-CM | POA: Diagnosis not present

## 2019-01-31 MED ORDER — LEVOTHYROXINE SODIUM 25 MCG PO TABS: 50.0000 ug | ORAL_TABLET | Freq: Every day | ORAL | 0 refills | Status: DC

## 2019-01-31 MED ORDER — VITAMIN D (ERGOCALCIFEROL) 1.25 MG (50000 UNIT) PO CAPS: 50000.0000 [IU] | ORAL_CAPSULE | ORAL | 0 refills | Status: DC

## 2019-02-01 NOTE — Progress Notes (Signed)
Office: [phone]  /  Fax: [phone] TeleHealth Visit:  Janice Wolf has verbally consented to this TeleHealth visit today. The patient is located at work, the provider is located at the [facility] office. The participants in this visit include the listed provider and patient and any and all parties involved. The visit was conducted today via FaceTime.  HPI:   Chief Complaint: OBESITY Janice Wolf is here to discuss her progress with her obesity treatment plan. She is on the keep a food journal with 1750 to 1900 calories and 125+ grams of protein daily plan and she is following her eating plan approximately 50 % of the time. She states she is walking 1 mile 4 to 6 times per week. Janice Wolf is having more stress with her teenage daughter, work and still grieving her friends death. Patient reports a weight of 289 pounds from yesterday (same as last appointment). Janice Wolf does mention she has gone back to not eating. We were unable to weigh the patient today for this TeleHealth visit. She feels as if she has maintained weight since her last visit. She has lost 13 lbs since starting treatment with [place].  Vitamin D deficiency Janice Wolf has a diagnosis of vitamin D deficiency. She is currently taking vit D. Janice Wolf admits fatigue and she denies nausea, vomiting or muscle weakness.  Hypothyroidism Janice Wolf has a diagnosis of hypothyroidism. She is on levothyroxine. She denies hot or cold intolerance or palpitations, but does admit to ongoing fatigue.  ASSESSMENT AND PLAN:  Other specified hypothyroidism - Plan: levothyroxine (SYNTHROID) 25 MCG tablet  Vitamin D deficiency - Plan: Vitamin D, Ergocalciferol, (DRISDOL) 1.25 MG (50000 UT) CAPS capsule  Class 3 severe obesity with serious comorbidity and body mass index (BMI) of 45.0 to 49.9 in adult, unspecified obesity type (Janice Wolf)  PLAN:  Vitamin D Deficiency Shaula was informed that low vitamin D levels contributes to  fatigue and are associated with obesity, breast, and colon cancer. Tamaka agrees to continue to take prescription Vit D @50 ,000 IU every week #4 with no refills and she will follow up for routine testing of vitamin D, at least 2-3 times per year. She was informed of the risk of over-replacement of vitamin D and agrees to not increase her dose unless she discusses this with [place] first. Janice Wolf agrees to follow up with our clinic in 2 weeks.  Hypothyroidism Janice Wolf was informed of the importance of good thyroid control to help with weight loss efforts. She was also informed that supertherapeutic thyroid levels are dangerous and will not improve weight loss results. Janice Wolf agree to continue levothyroxine 50 mcg qAM #30 with no refills and follow up as directed.  Obesity Janice Wolf is currently in the action stage of change. As such, her goal is to continue with weight loss efforts She has agreed to keep a food journal with 1750 to 1900 calories and 125+ grams of protein daily Brytni has been instructed to work up to a goal of 150 minutes of combined cardio and strengthening exercise per week for weight loss and overall health benefits. We discussed the following Behavioral Modification Strategies today: keep a strict food journal, increasing lean protein intake, increasing vegetables and work on meal planning and easy cooking plans  Patient is to work on getting in lunch and dinner at least 3 to 4 times per week.  Kinsey has agreed to follow up with our clinic in 2 weeks. She was informed of the importance of frequent follow up visits to  maximize her success with intensive lifestyle modifications for her multiple health conditions.  ALLERGIES: Allergies  Allergen Reactions  . Aspirin Nausea And Vomiting  . Latex Other (See Comments)    SKIN IRRITATION  . Other Rash    PERFUMES    MEDICATIONS: Current Outpatient Medications on File Prior to Visit  Medication Sig Dispense Refill  .  esomeprazole (NEXIUM) 20 MG capsule Take 20 mg by mouth daily at 12 noon.    [name] levonorgestrel (MIRENA) 20 MCG/24HR IUD 1 each by Intrauterine route once.    . Multiple Vitamins-Minerals (MULTIVITAMIN WITH MINERALS) tablet Take 1 tablet by mouth daily.     No current facility-administered medications on file prior to visit.     PAST MEDICAL HISTORY: Past Medical History:  Diagnosis Date  . Abnormal thyroid blood test    no treatment yet, per pt.  . Anxiety   . Back pain   . Complication of anesthesia    was hard to wake up after T & A 12/2015; had to stay overnight  . Dental crowns present   . Depression   . Dizziness   . Fatty liver   . Gallbladder problem   . GERD (gastroesophageal reflux disease)   . Hypothyroidism   . IBS (irritable bowel syndrome)   . Migraines    migraines  . Painful orthopaedic hardware ([place]) 09/2016   left foot  . PONV (postoperative nausea and vomiting)   . Stuffy nose 09/03/2016  . Vitamin D deficiency     PAST SURGICAL HISTORY: Past Surgical History:  Procedure Laterality Date  . CHOLECYSTECTOMY     age 18  . HARDWARE REMOVAL Left 10/01/2016   Procedure: HARDWARE REMOVAL left foot;  Surgeon: [name], MD;  Location: [place];  Service: Orthopedics;  Laterality: Left;  . TARSAL METATARSAL ARTHRODESIS Left 02/20/2016   Procedure: FIRST AND SECOND TARSAL METATARSAL ARTHRODESIS;  Surgeon: [name], MD;  Location: [place];  Service: Orthopedics;  Laterality: Left;  . TONSILLECTOMY AND ADENOIDECTOMY  12/2015    SOCIAL HISTORY: Social History   Tobacco Use  . Smoking status: Never Smoker  . Smokeless tobacco: Never Used  Substance Use Topics  . Alcohol use: No  . Drug use: No    FAMILY HISTORY: Family History  Problem Relation Age of Onset  . Diabetes Mother   . Hypertension Mother   . Anxiety disorder Mother   . Obesity Father   . Hyperlipidemia Father   . Hypertension Father   . Diabetes  Father     ROS: Review of Systems  Constitutional: Positive for malaise/fatigue. Negative for weight loss.  Cardiovascular: Negative for palpitations.  Gastrointestinal: Negative for nausea and vomiting.  Musculoskeletal:       Negative for muscle weakness  Endo/Heme/Allergies:       Negative for heat or cold intolerance    PHYSICAL EXAM: Pt in no acute distress  RECENT LABS AND TESTS: BMET No results found for: NA, K, CL, CO2, GLUCOSE, BUN, CREATININE, CALCIUM, GFRNONAA, GFRAA Lab Results  Component Value Date   HGBA1C 5.4 10/25/2018   HGBA1C 5.6 07/14/2018   Lab Results  Component Value Date   INSULIN 52.2 (H) 10/25/2018   INSULIN 35.5 (H) 07/14/2018   CBC No results found for: WBC, RBC, HGB, HCT, PLT, MCV, MCH, MCHC, RDW, LYMPHSABS, MONOABS, EOSABS, BASOSABS Iron/TIBC/Ferritin/ %Sat No results found for: IRON, TIBC, FERRITIN, IRONPCTSAT Lipid Panel  No results found for: CHOL, TRIG, HDL, CHOLHDL, VLDL, LDLCALC,  LDLDIRECT Hepatic Function Panel  No results found for: PROT, ALBUMIN, AST, ALT, ALKPHOS, BILITOT, BILIDIR, IBILI    Component Value Date/Time   TSH 2.750 10/25/2018 1011     Ref. Range 10/25/2018 10:11  Vitamin D, 25-Hydroxy Latest Ref Range: 30.0 - 100.0 ng/mL 33.1    I, Doreene Nest, am acting as Location manager for Eber Jones, MD  I have reviewed the above documentation for accuracy and completeness, and I agree with the above. - Ilene Qua, MD

## 2019-02-09 ENCOUNTER — Encounter (INDEPENDENT_AMBULATORY_CARE_PROVIDER_SITE_OTHER): Payer: Self-pay | Admitting: Family Medicine

## 2019-02-09 DIAGNOSIS — F3289 Other specified depressive episodes: Secondary | ICD-10-CM

## 2019-02-13 NOTE — Telephone Encounter (Signed)
Please advise 

## 2019-02-16 MED ORDER — SERTRALINE HCL 25 MG PO TABS: 25.0000 mg | ORAL_TABLET | Freq: Every day | ORAL | 0 refills | Status: DC

## 2019-03-01 ENCOUNTER — Other Ambulatory Visit (INDEPENDENT_AMBULATORY_CARE_PROVIDER_SITE_OTHER): Payer: Self-pay | Admitting: Family Medicine

## 2019-03-01 DIAGNOSIS — E559 Vitamin D deficiency, unspecified: Secondary | ICD-10-CM

## 2019-03-06 ENCOUNTER — Encounter (INDEPENDENT_AMBULATORY_CARE_PROVIDER_SITE_OTHER): Payer: Self-pay | Admitting: Physician Assistant

## 2019-03-06 ENCOUNTER — Other Ambulatory Visit: Payer: Self-pay

## 2019-03-06 ENCOUNTER — Telehealth (INDEPENDENT_AMBULATORY_CARE_PROVIDER_SITE_OTHER): Payer: 59 | Admitting: Physician Assistant

## 2019-03-06 DIAGNOSIS — F3289 Other specified depressive episodes: Secondary | ICD-10-CM

## 2019-03-06 DIAGNOSIS — E559 Vitamin D deficiency, unspecified: Secondary | ICD-10-CM

## 2019-03-06 DIAGNOSIS — Z6841 Body Mass Index (BMI) 40.0 and over, adult: Secondary | ICD-10-CM | POA: Diagnosis not present

## 2019-03-06 MED ORDER — SERTRALINE HCL 50 MG PO TABS: 50.0000 mg | ORAL_TABLET | Freq: Every day | ORAL | 0 refills | Status: DC

## 2019-03-06 MED ORDER — VITAMIN D (ERGOCALCIFEROL) 1.25 MG (50000 UNIT) PO CAPS: 50000.0000 [IU] | ORAL_CAPSULE | ORAL | 0 refills | Status: DC

## 2019-03-07 NOTE — Progress Notes (Signed)
Office: [phone]  /  Fax: [phone] TeleHealth Visit:  Janice Wolf has verbally consented to this TeleHealth visit today. The patient is located at home, the provider is located at the [facility] office. The participants in this visit include the listed provider, patient, husband Janice Wolf) and any and all parties involved. The visit was conducted today via FaceTime.  HPI:   Chief Complaint: OBESITY Janice Wolf is here to discuss her progress with her obesity treatment plan. She is on the keep a food journal with 1750 to 1900 calories and 125 grams of protein daily and she is following her eating plan approximately 40 % of the time. She states she is walking 60 minutes 3 times per week. Janice Wolf's most recent weight is 295 pounds (03/06/19). She is not eating all of the food on her plan, due to some depression. She started Zoloft one month ago and she states it's not helping. She works rotating shifts and she eats more when she's at work. We were unable to weigh the patient today for this TeleHealth visit. She feels as if she has maintained weight since her last visit. She has lost 13 lbs since starting treatment with [place].  Vitamin D deficiency Janice Wolf has a diagnosis of vitamin D deficiency. Janice Wolf is currently taking vit D and she denies nausea, vomiting or muscle weakness.  Depression with emotional eating behaviors Janice Wolf reports that she does not eat when she is feeling down. Janice Wolf is struggling with emotional eating and using food for comfort to the extent that it is negatively impacting her health. She often snacks when she is not hungry. Janice Wolf sometimes feels she is out of control and then feels guilty that she made poor food choices. She has been working on behavior modification techniques to help reduce her emotional eating and has been somewhat successful. She shows no sign of suicidal or homicidal ideations.  ASSESSMENT AND PLAN:  Vitamin D  deficiency - Plan: Vitamin D, Ergocalciferol, (DRISDOL) 1.25 MG (50000 UT) CAPS capsule  Other depression - with emotional eating - Plan: sertraline (ZOLOFT) 50 MG tablet  Class 3 severe obesity with serious comorbidity and body mass index (BMI) of 45.0 to 49.9 in adult, unspecified obesity type (Janice Wolf)  PLAN:  Vitamin D Deficiency Janice Wolf was informed that low vitamin D levels contributes to fatigue and are associated with obesity, breast, and colon cancer. Janice Wolf agrees to continue to take prescription Vit D @50 ,000 IU every week#4 with no refills and she will follow up for routine testing of vitamin D, at least 2-3 times per year. She was informed of the risk of over-replacement of vitamin D and agrees to not increase her dose unless she discusses this with [place] first. Janice Wolf agrees to follow up with our clinic in 3 to 4 weeks.  Depression with Emotional Eating Behaviors We discussed behavior modification techniques today to help Janice Wolf deal with her emotional eating and depression. She has agreed to increase Zoloft to 50 mg once daily #30 with no refills and follow up as directed.  Obesity Janice Wolf is currently in the action stage of change. As such, her goal is to continue with weight loss efforts She has agreed to change to the Category 3 plan Janice Wolf has been instructed to work up to a goal of 150 minutes of combined cardio and strengthening exercise per week for weight loss and overall health benefits. We discussed the following Behavioral Modification Strategies today: keeping healthy foods in the home and work  on meal planning and easy cooking plans  Janice Wolf has agreed to follow up with our clinic in 3 to 4 weeks. She was informed of the importance of frequent follow up visits to maximize her success with intensive lifestyle modifications for her multiple health conditions.  ALLERGIES: Allergies  Allergen Reactions   Aspirin Nausea And Vomiting   Latex Other (See Comments)     SKIN IRRITATION   Other Rash    PERFUMES    MEDICATIONS: Current Outpatient Medications on File Prior to Visit  Medication Sig Dispense Refill   esomeprazole (NEXIUM) 20 MG capsule Take 20 mg by mouth daily at 12 noon.     levonorgestrel (MIRENA) 20 MCG/24HR IUD 1 each by Intrauterine route once.     levothyroxine (SYNTHROID) 25 MCG tablet Take 2 tablets (50 mcg total) by mouth daily before breakfast. 30 tablet 0   Multiple Vitamins-Minerals (MULTIVITAMIN WITH MINERALS) tablet Take 1 tablet by mouth daily.     No current facility-administered medications on file prior to visit.     PAST MEDICAL HISTORY: Past Medical History:  Diagnosis Date   Abnormal thyroid blood test    no treatment yet, per pt.   Anxiety    Back pain    Complication of anesthesia    was hard to wake up after T & A 12/2015; had to stay overnight   Dental crowns present    Depression    Dizziness    Fatty liver    Gallbladder problem    GERD (gastroesophageal reflux disease)    Hypothyroidism    IBS (irritable bowel syndrome)    Migraines    migraines   Painful orthopaedic hardware ([place]) 09/2016   left foot   PONV (postoperative nausea and vomiting)    Stuffy nose 09/03/2016   Vitamin D deficiency     PAST SURGICAL HISTORY: Past Surgical History:  Procedure Laterality Date   CHOLECYSTECTOMY     age 82   HARDWARE REMOVAL Left 10/01/2016   Procedure: HARDWARE REMOVAL left foot;  Surgeon: [name], MD;  Location: [place];  Service: Orthopedics;  Laterality: Left;   TARSAL METATARSAL ARTHRODESIS Left 02/20/2016   Procedure: FIRST AND SECOND TARSAL METATARSAL ARTHRODESIS;  Surgeon: [name], MD;  Location: [place];  Service: Orthopedics;  Laterality: Left;   TONSILLECTOMY AND ADENOIDECTOMY  12/2015    SOCIAL HISTORY: Social History   Tobacco Use   Smoking status: Never Smoker   Smokeless tobacco: Never Used  Substance Use  Topics   Alcohol use: No   Drug use: No    FAMILY HISTORY: Family History  Problem Relation Age of Onset   Diabetes Mother    Hypertension Mother    Anxiety disorder Mother    Obesity Father    Hyperlipidemia Father    Hypertension Father    Diabetes Father     ROS: Review of Systems  Constitutional: Negative for weight loss.  Gastrointestinal: Negative for nausea and vomiting.  Musculoskeletal:       Negative for muscle weakness  Psychiatric/Behavioral: Positive for depression. Negative for suicidal ideas.    PHYSICAL EXAM: Pt in no acute distress  RECENT LABS AND TESTS: BMET No results found for: NA, K, CL, CO2, GLUCOSE, BUN, CREATININE, CALCIUM, GFRNONAA, GFRAA Lab Results  Component Value Date   HGBA1C 5.4 10/25/2018   HGBA1C 5.6 07/14/2018   Lab Results  Component Value Date   INSULIN 52.2 (H) 10/25/2018   INSULIN 35.5 (H) 07/14/2018  CBC No results found for: WBC, RBC, HGB, HCT, PLT, MCV, MCH, MCHC, RDW, LYMPHSABS, MONOABS, EOSABS, BASOSABS Iron/TIBC/Ferritin/ %Sat No results found for: IRON, TIBC, FERRITIN, IRONPCTSAT Lipid Panel  No results found for: CHOL, TRIG, HDL, CHOLHDL, VLDL, LDLCALC, LDLDIRECT Hepatic Function Panel  No results found for: PROT, ALBUMIN, AST, ALT, ALKPHOS, BILITOT, BILIDIR, IBILI    Component Value Date/Time   TSH 2.750 10/25/2018 1011     Ref. Range 10/25/2018 10:11  Vitamin D, 25-Hydroxy Latest Ref Range: 30.0 - 100.0 ng/mL 33.1    I, Doreene Nest, am acting as Location manager for Abby Potash, PA-C I, Abby Potash, PA-C have reviewed above note and agree with its content

## 2019-04-11 ENCOUNTER — Other Ambulatory Visit: Payer: Self-pay

## 2019-04-11 ENCOUNTER — Encounter (INDEPENDENT_AMBULATORY_CARE_PROVIDER_SITE_OTHER): Payer: Self-pay | Admitting: Family Medicine

## 2019-04-11 ENCOUNTER — Telehealth (INDEPENDENT_AMBULATORY_CARE_PROVIDER_SITE_OTHER): Payer: 59 | Admitting: Family Medicine

## 2019-04-11 DIAGNOSIS — E8881 Metabolic syndrome: Secondary | ICD-10-CM | POA: Diagnosis not present

## 2019-04-11 DIAGNOSIS — Z6841 Body Mass Index (BMI) 40.0 and over, adult: Secondary | ICD-10-CM

## 2019-04-11 DIAGNOSIS — E559 Vitamin D deficiency, unspecified: Secondary | ICD-10-CM | POA: Diagnosis not present

## 2019-04-11 DIAGNOSIS — G4726 Circadian rhythm sleep disorder, shift work type: Secondary | ICD-10-CM | POA: Diagnosis not present

## 2019-04-11 DIAGNOSIS — E038 Other specified hypothyroidism: Secondary | ICD-10-CM | POA: Diagnosis not present

## 2019-04-11 DIAGNOSIS — F3289 Other specified depressive episodes: Secondary | ICD-10-CM

## 2019-04-11 MED ORDER — VITAMIN D (ERGOCALCIFEROL) 1.25 MG (50000 UNIT) PO CAPS
50000.0000 [IU] | ORAL_CAPSULE | ORAL | 0 refills | Status: DC
Start: 2019-04-11 — End: 2019-05-08

## 2019-04-12 ENCOUNTER — Encounter (INDEPENDENT_AMBULATORY_CARE_PROVIDER_SITE_OTHER): Payer: Self-pay | Admitting: Family Medicine

## 2019-04-12 MED ORDER — SERTRALINE HCL 50 MG PO TABS: 50.0000 mg | ORAL_TABLET | Freq: Every day | ORAL | 0 refills | Status: DC

## 2019-04-12 NOTE — Progress Notes (Signed)
Office: [phone]  /  Fax: [phone] TeleHealth Visit:  Janice Wolf has verbally consented to this TeleHealth visit today. The patient is located at home, the provider is located at the [facility] office. The participants in this visit include the listed provider and patient. The visit was conducted today via [email or web address] (27 minutes).  HPI:   Chief Complaint: OBESITY Janice Wolf is here to discuss her progress with her obesity treatment plan. She is on the Category 3 plan and is following her eating plan approximately 50 % of the time. She states she is walking at work for 15-20 minutes 2 times per week. Janice Wolf's work shift has rapid cycles. She is tired all the time, only getting 4-7 hours of sleep. She notes it is hard to eat all of her protein and she doesn't like to eat breakfast. She was changed to virtual visit due to COVID precautions.  We were unable to weigh the patient today for this TeleHealth visit. She feels as if she has maintained her weight since her last visit. She has lost 13 lbs since starting treatment with [place].  Vitamin D Deficiency Janice Wolf has a diagnosis of vitamin D deficiency. She is currently taking prescription Vit D and denies nausea, vomiting or muscle weakness.  Hypothyroidism Janice Wolf has a diagnosis of hypothyroidism. She is currently on levothyroxine. She denies hot or cold intolerance or palpitations.  Insulin Resistance Janice Wolf has a diagnosis of insulin resistance based on her elevated fasting insulin level >5. Although Janice Wolf's blood glucose readings are still under good control, insulin resistance puts her at greater risk of metabolic syndrome and diabetes. She is not taking metformin currently and continues to work on diet and exercise to decrease risk of diabetes.  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 She is to consider medications (Nuvigil) to help regulate sleep.  Depression  with Emotional Eating Behaviors Janice Wolf is taking Zoloft and we increased dose at her last visit. Janice Wolf struggles with emotional eating and using food for comfort to the extent that it is negatively impacting her health. She often snacks when she is not hungry. Janice Wolf sometimes feels she is out of control and then feels guilty that she made poor food choices. She has been working on behavior modification techniques to help reduce her emotional eating and has been somewhat successful. She shows no sign of suicidal or homicidal ideations.  Depression screen PHQ 2/9 07/14/2018  Decreased Interest 3  Down, Depressed, Hopeless 3  PHQ - 2 Score 6  Altered sleeping 3  Tired, decreased energy 2  Change in appetite 2  Feeling bad or failure about yourself  1  Trouble concentrating 3  Moving slowly or fidgety/restless 0  Suicidal thoughts 0  PHQ-9 Score 17  Difficult doing work/chores Not difficult at all    ASSESSMENT AND PLAN:  Vitamin D deficiency - Plan: Vitamin D, Ergocalciferol, (DRISDOL) 1.25 MG (50000 UT) CAPS capsule  Other specified hypothyroidism  Insulin resistance  Shift work sleep disorder - Plan: Ambulatory referral to Neurology  Other depression, emotional eating  Class 3 severe obesity with serious comorbidity and body mass index (BMI) of 45.0 to 49.9 in adult, unspecified obesity type ([place])  PLAN:  Vitamin D Deficiency Low vitamin D level contributes to fatigue and are associated with obesity, breast, and colon cancer. Janice Wolf agrees to continue taking prescription Vit D 50,000 IU every week #4 and we will refill for 1 month. She will follow up for  routine testing of vitamin D, at least 2-3 times per year to avoid over-replacement. Janice Wolf agrees to follow up with our clinic in 2 weeks.  Hypothyroidism Janice Wolf was informed of the importance of good thyroid control for overall health and that supertheraputic thyroid levels are dangerous and will not improve weight  loss results. We will continue to monitor.  Insulin Resistance Janice Wolf will continue to work on weight loss, exercise, and decreasing simple carbohydrates to help decrease the risk of diabetes. Janice Wolf agrees to follow up with [place] as directed to closely monitor her progress.  Shift Work Sleep Disorder We have sent a referral to [facility] for sleep evaluation.  Emotional Eating Behaviors (other depression) Behavior modification techniques were discussed today to help Janice Wolf deal with her emotional/non-hunger eating behaviors. Janice Wolf agrees to continue taking Zoloft 50 mg PO daily #30 and we will refill for 1 month. She is to let her medication get to a steady state. Janice Wolf agrees to follow up with our clinic in 2 weeks.  Obesity Janice Wolf is currently in the action stage of change. As such, her goal is to continue with weight loss efforts She has agreed to follow the Category 3 plan Janice Wolf has been instructed to work up to a goal of 150 minutes of combined cardio and strengthening exercise per week for weight loss and overall health benefits.  We discussed the following Behavioral Modification Strategies today: increasing lean protein intake, decreasing simple carbohydrates, increasing vegetables, increase H20 intake, decrease eating out, celebration eating strategies, and emotional eating strategies Janice Wolf is ok to have a meal replacement shake for breakfast with 30+ grams of protein.  Janice Wolf has agreed to follow up with our clinic in 2 weeks with Janice Potash, PA-C. She was informed of the importance of frequent follow up visits to maximize her success with intensive lifestyle modifications for her multiple health conditions.  ALLERGIES: Allergies  Allergen Reactions  . Aspirin Nausea And Vomiting  . Latex Other (See Comments)    SKIN IRRITATION  . Other Rash    PERFUMES    MEDICATIONS: Current Outpatient Medications on File Prior to Visit  Medication Sig Dispense  Refill  . esomeprazole (NEXIUM) 20 MG capsule Take 20 mg by mouth daily at 12 noon.    [name] levonorgestrel (MIRENA) 20 MCG/24HR IUD 1 each by Intrauterine route once.    [name] levothyroxine (SYNTHROID) 25 MCG tablet Take 2 tablets (50 mcg total) by mouth daily before breakfast. 30 tablet 0  . Multiple Vitamins-Minerals (MULTIVITAMIN WITH MINERALS) tablet Take 1 tablet by mouth daily.    . sertraline (ZOLOFT) 50 MG tablet Take 1 tablet (50 mg total) by mouth daily. 30 tablet 0   No current facility-administered medications on file prior to visit.     PAST MEDICAL HISTORY: Past Medical History:  Diagnosis Date  . Abnormal thyroid blood test    no treatment yet, per pt.  . Anxiety   . Back pain   . Complication of anesthesia    was hard to wake up after T & A 12/2015; had to stay overnight  . Dental crowns present   . Depression   . Dizziness   . Fatty liver   . Gallbladder problem   . GERD (gastroesophageal reflux disease)   . Hypothyroidism   . IBS (irritable bowel syndrome)   . Migraines    migraines  . Painful orthopaedic hardware ([place]) 09/2016   left foot  . PONV (postoperative nausea and vomiting)   . Stuffy nose  09/03/2016  . Vitamin D deficiency     PAST SURGICAL HISTORY: Past Surgical History:  Procedure Laterality Date  . CHOLECYSTECTOMY     age 84  . HARDWARE REMOVAL Left 10/01/2016   Procedure: HARDWARE REMOVAL left foot;  Surgeon: [name], MD;  Location: [place];  Service: Orthopedics;  Laterality: Left;  . TARSAL METATARSAL ARTHRODESIS Left 02/20/2016   Procedure: FIRST AND SECOND TARSAL METATARSAL ARTHRODESIS;  Surgeon: [name], MD;  Location: [place];  Service: Orthopedics;  Laterality: Left;  . TONSILLECTOMY AND ADENOIDECTOMY  12/2015   SOCIAL HISTORY: Social History   Tobacco Use  . Smoking status: Never Smoker  . Smokeless tobacco: Never Used  Substance Use Topics  . Alcohol use: No  . Drug use: No   FAMILY  HISTORY: Family History  Problem Relation Age of Onset  . Diabetes Mother   . Hypertension Mother   . Anxiety disorder Mother   . Obesity Father   . Hyperlipidemia Father   . Hypertension Father   . Diabetes Father    ROS: Review of Systems  Constitutional: Negative for weight loss.  Cardiovascular: Negative for palpitations.  Gastrointestinal: Negative for nausea and vomiting.  Musculoskeletal:       Negative muscle weakness  Endo/Heme/Allergies:       Negative hot/cold intolerance  Psychiatric/Behavioral: Positive for depression. Negative for suicidal ideas.   PHYSICAL EXAM: VITALS: Per patient if applicable, see vitals. GENERAL: Alert and in no acute distress. CARDIOPULMONARY: No increased WOB. Speaking in clear sentences.  PSYCH: Pleasant and cooperative. Speech normal rate and rhythm. Affect is appropriate. Insight and judgement are appropriate. Attention is focused, linear, and appropriate.  NEURO: Oriented as arrived to appointment on time with no prompting.   RECENT LABS AND TESTS: Lab Results  Component Value Date   HGBA1C 5.4 10/25/2018   HGBA1C 5.6 07/14/2018   Lab Results  Component Value Date   INSULIN 52.2 (H) 10/25/2018   INSULIN 35.5 (H) 07/14/2018      Component Value Date/Time   TSH 2.750 10/25/2018 1011   I, Trixie Dredge, am acting as transcriptionist for Briscoe Deutscher, DO  I have reviewed the above documentation for accuracy and completeness, and I agree with the above. Briscoe Deutscher, DO

## 2019-04-19 ENCOUNTER — Institutional Professional Consult (permissible substitution): Payer: 59 | Admitting: Neurology

## 2019-05-06 ENCOUNTER — Other Ambulatory Visit (INDEPENDENT_AMBULATORY_CARE_PROVIDER_SITE_OTHER): Payer: Self-pay | Admitting: Family Medicine

## 2019-05-06 DIAGNOSIS — E559 Vitamin D deficiency, unspecified: Secondary | ICD-10-CM

## 2019-05-08 ENCOUNTER — Telehealth (INDEPENDENT_AMBULATORY_CARE_PROVIDER_SITE_OTHER): Payer: 59 | Admitting: Physician Assistant

## 2019-05-08 ENCOUNTER — Other Ambulatory Visit: Payer: Self-pay

## 2019-05-08 ENCOUNTER — Encounter (INDEPENDENT_AMBULATORY_CARE_PROVIDER_SITE_OTHER): Payer: Self-pay | Admitting: Physician Assistant

## 2019-05-08 DIAGNOSIS — F3289 Other specified depressive episodes: Secondary | ICD-10-CM

## 2019-05-08 DIAGNOSIS — Z6841 Body Mass Index (BMI) 40.0 and over, adult: Secondary | ICD-10-CM | POA: Diagnosis not present

## 2019-05-08 DIAGNOSIS — E559 Vitamin D deficiency, unspecified: Secondary | ICD-10-CM

## 2019-05-08 MED ORDER — VITAMIN D (ERGOCALCIFEROL) 1.25 MG (50000 UNIT) PO CAPS
50000.0000 [IU] | ORAL_CAPSULE | ORAL | 0 refills | Status: DC
Start: 2019-05-08 — End: 2019-07-18

## 2019-05-08 MED ORDER — SERTRALINE HCL 50 MG PO TABS: 50.0000 mg | ORAL_TABLET | ORAL | 0 refills | Status: DC

## 2019-05-09 ENCOUNTER — Ambulatory Visit: Payer: 59 | Admitting: Neurology

## 2019-05-09 ENCOUNTER — Encounter: Payer: Self-pay | Admitting: Neurology

## 2019-05-09 VITALS — BP 107/62 | HR 76 | Temp 97.2°F | Ht 67.0 in | Wt 301.0 lb

## 2019-05-09 DIAGNOSIS — G4719 Other hypersomnia: Secondary | ICD-10-CM | POA: Diagnosis not present

## 2019-05-09 DIAGNOSIS — Z6841 Body Mass Index (BMI) 40.0 and over, adult: Secondary | ICD-10-CM

## 2019-05-09 DIAGNOSIS — G4726 Circadian rhythm sleep disorder, shift work type: Secondary | ICD-10-CM | POA: Diagnosis not present

## 2019-05-09 DIAGNOSIS — R0683 Snoring: Secondary | ICD-10-CM

## 2019-05-09 DIAGNOSIS — Z9189 Other specified personal risk factors, not elsewhere classified: Secondary | ICD-10-CM

## 2019-05-09 NOTE — Progress Notes (Signed)
Subjective:    Patient ID: Janice Wolf is a 39 y.o. female.  HPI     Star Age, MD, PhD [facility] [address] [address] [place], [place] [address]  Dear Dr. Juleen China,   I saw your patient, Janice Wolf, upon your kind request in my sleep clinic today for initial consultation of Janice Wolf sleep disorder, in particular, concern for underlying obstructive sleep apnea.  The patient is unaccompanied today.  As you know, Janice Wolf is a 39 year old right-handed woman with an underlying medical history of vitamin D deficiency, migraine headaches, irritable bowel syndrome, hypothyroidism, reflux, low back pain, left foot pain with status post left foot surgery, and morbid obesity with a BMI of over 45, who reports snoring and excessive daytime somnolence.  I reviewed your office note from 04/11/2019.  Janice Wolf Epworth sleepiness score is 19 out of 24, fatigue severity score is 61 out of 63.  Per husband's feedback, Janice Wolf snoring has increased in the recent past as well as Janice Wolf restlessness while asleep.  She denies any telltale symptoms of restless leg syndrome.  She has a variable sleep schedule because of Janice Wolf shift work.  She works rotational shift, 12 hours, 7 AM to 7 PM or 7 PM to 7 AM.  She sleeps generally speaking better during the day.  When she works nights, she tries to go to bed around midnight but may not be asleep until 2 AM, she has to get up at 5:30 AM.  When she works nights, she generally is in bed between 830 and 9 AM and sleeps till 5 PM.  She does not have recurrent nocturia but has had the occasional morning headache.  To help Janice Wolf sleep at night she has tried melatonin but had residual grogginess the next day, she has not been on any other over-the-counter sleep aid or prescription sleeping pill.  She suspects that both parents had obstructive sleep apnea but neither parent was tested.  She had a tonsillectomy in 2017 secondary to tonsillar stones.  She  lives with Janice Wolf husband and 2 biological children, ages 17 and nearly 90, Janice Wolf 103 year old stepson lives with them part-time.  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  She is a non-smoker, she drinks caffeine and limitation in the form of soda, 1 serving per day, does not currently drink any alcohol.  She has 2 cats in the household and 1 dog, she has a TV in the bedroom but does not typically use it.  One of the cat sleeps with Janice Wolf at night.  Janice Wolf husband has sleep apnea and uses a CPAP machine.  She would be willing to consider CPAP therapy if the need arises.  Janice Wolf Past Medical History Is Significant For: Past Medical History:  Diagnosis Date  . Abnormal thyroid blood test    no treatment yet, per pt.  . Anxiety   . Back pain   . Complication of anesthesia    was hard to wake up after T & A 12/2015; had to stay overnight  . Dental crowns present   . Depression   . Dizziness   . Fatty liver   . Gallbladder problem   . GERD (gastroesophageal reflux disease)   . Hypothyroidism   . IBS (irritable bowel syndrome)   . Migraines    migraines  . Painful orthopaedic hardware ([place]) 09/2016   left foot  . PONV (postoperative nausea and vomiting)   . Stuffy nose 09/03/2016  .  Vitamin D deficiency     Janice Wolf Past Surgical History Is Significant For: Past Surgical History:  Procedure Laterality Date  . CHOLECYSTECTOMY     age 55  . HARDWARE REMOVAL Left 10/01/2016   Procedure: HARDWARE REMOVAL left foot;  Surgeon: [name], MD;  Location: [place];  Service: Orthopedics;  Laterality: Left;  . TARSAL METATARSAL ARTHRODESIS Left 02/20/2016   Procedure: FIRST AND SECOND TARSAL METATARSAL ARTHRODESIS;  Surgeon: [name], MD;  Location: [place];  Service: Orthopedics;  Laterality: Left;  . TONSILLECTOMY AND ADENOIDECTOMY  12/2015    Janice Wolf Family History Is Significant For: Family History  Problem Relation Age of Onset  . Diabetes Mother   .  Hypertension Mother   . Anxiety disorder Mother   . Obesity Father   . Hyperlipidemia Father   . Hypertension Father   . Diabetes Father     Janice Wolf Social History Is Significant For: Social History   Socioeconomic History  . Marital status: Married    Spouse name: Grayland Ormond  . Number of children: 2  . Years of education: Not on file  . Highest education level: Not on file  Occupational History  . Occupation: [employer]  . Smoking status: Never Smoker  . Smokeless tobacco: Never Used  Substance and Sexual Activity  . Alcohol use: No  . Drug use: No  . Sexual activity: Yes    Birth control/protection: I.U.D.  Other Topics Concern  . Not on file  Social History Narrative  . Not on file   Social Determinants of Health   Financial Resource Strain:   . Difficulty of Paying Living Expenses: Not on file  Food Insecurity:   . Worried About Charity fundraiser in the Last Year: Not on file  . Ran Out of Food in the Last Year: Not on file  Transportation Needs:   . Lack of Transportation (Medical): Not on file  . Lack of Transportation (Non-Medical): Not on file  Physical Activity:   . Days of Exercise per Week: Not on file  . Minutes of Exercise per Session: Not on file  Stress:   . Feeling of Stress : Not on file  Social Connections:   . Frequency of Communication with Friends and Family: Not on file  . Frequency of Social Gatherings with Friends and Family: Not on file  . Attends Religious Services: Not on file  . Active Member of Clubs or Organizations: Not on file  . Attends Archivist Meetings: Not on file  . Marital Status: Not on file    Janice Wolf Allergies Are:  Allergies  Allergen Reactions  . Aspirin Nausea And Vomiting  . Latex Other (See Comments)    SKIN IRRITATION  . Other Rash    PERFUMES  :   Janice Wolf Current Medications Are:  Outpatient Encounter Medications as of 05/09/2019  Medication Sig  . diclofenac Sodium (VOLTAREN) 1 % GEL SMARTSIG:2  Gram(s) Topical 4 Times Daily  . esomeprazole (NEXIUM) 20 MG capsule Take 20 mg by mouth daily at 12 noon.  . gabapentin (NEURONTIN) 100 MG capsule Take 100 mg by mouth 3 (three) times a week. Only when working  . ibuprofen (ADVIL) 200 MG tablet Take by mouth.  . levonorgestrel (MIRENA) 20 MCG/24HR IUD 1 each by Intrauterine route once.  [name] levothyroxine (SYNTHROID) 25 MCG tablet Take 2 tablets (50 mcg total) by mouth daily before breakfast.  . lidocaine (XYLOCAINE) 5 % ointment APPLY  TO AFFECTED AREA 1 4 TIMES DAILY AS NEEDED  . Multiple Vitamins-Minerals (MULTIVITAMIN WITH MINERALS) tablet Take 1 tablet by mouth daily.  . sertraline (ZOLOFT) 50 MG tablet Take 1 tablet (50 mg total) by mouth as directed. Take 1 1/2 daily (Patient taking differently: Take 50 mg by mouth as directed. Take 75 mg daily)  . traMADol (ULTRAM) 50 MG tablet Take 75 mg by mouth 2 (two) times daily as needed.   . Vitamin D, Ergocalciferol, (DRISDOL) 1.25 MG (50000 UT) CAPS capsule Take 1 capsule (50,000 Units total) by mouth every 7 (seven) days.  . [DISCONTINUED] esomeprazole (NEXIUM) 40 MG capsule Take by mouth.  . [DISCONTINUED] gabapentin (NEURONTIN) 100 MG capsule Neurontin 100 mg capsule  Take 1 capsule 3 times a day by oral route as needed.  . [DISCONTINUED] levonorgestrel (MIRENA) 20 MCG/24HR IUD by Intrauterine route.  . [DISCONTINUED] levothyroxine (SYNTHROID) 25 MCG tablet Take by mouth.  . [DISCONTINUED] neomycin-polymyxin-hydrocortisone (CORTISPORIN) 3.5-10000-1 OTIC suspension neomycin-polymyxin-hydrocort 3.5 mg-10,000 unit/mL-1 % ear drops,susp  PLACE 4 DROPS IN AFFECTED EAR 3 TIMES A DAY FOR 7 DAYS   No facility-administered encounter medications on file as of 05/09/2019.  :  Review of Systems:  Out of a complete 14 point review of systems, all are reviewed and negative with the exception of these symptoms as listed below: Review of Systems  Constitutional: Positive for appetite change and fatigue.   HENT:       Spinning sensation  Eyes: Positive for visual disturbance.       Double vision  Respiratory: Negative.   Cardiovascular: Negative.   Gastrointestinal: Negative.   Endocrine: Negative.   Genitourinary: Negative.   Musculoskeletal: Positive for joint swelling.       Joint pain  Swelling in hands and feet  Neurological: Positive for headaches.       Memory loss Epworth Sleepiness Scale 0= would never doze 1= slight chance of dozing 2= moderate chance of dozing 3= high chance of dozing  Sitting and reading:3 Watching TV:3 Sitting inactive in a public place (ex. Theater or meeting):2 As a passenger in a car for an hour without a break:3 Lying down to rest in the afternoon:3 Sitting and talking to someone:1 Sitting quietly after lunch (no alcohol):3 In a car, while stopped in traffic:1 Total:19   Hematological: Negative.   Psychiatric/Behavioral: Positive for sleep disturbance.       Anxiety Not enough sleep Decrease energy Disinterest in activities Racing thoughts      Objective:  Neurological Exam  Physical Exam Physical Examination:   Vitals:   05/09/19 1105  BP: 107/62  Pulse: 76  Temp: (!) 97.2 F (36.2 C)    General Examination: The patient is a very pleasant 39 y.o. female in no acute distress. She appears well-developed and well-nourished and well groomed.   HEENT: Normocephalic, atraumatic, pupils are equal, round and reactive to light, extraocular tracking is good without limitation to gaze excursion or nystagmus noted. Hearing is grossly intact. Face is symmetric with normal facial animation. Speech is clear with no dysarthria noted. There is no hypophonia. There is no lip, neck/head, jaw or voice tremor. Neck is supple with full range of passive and active motion. There are no carotid bruits on auscultation. Oropharynx exam reveals: mild mouth dryness, adequate dental hygiene and mild airway crowding, due to Small airway entry, otherwise  benign findings, tonsils absent, Mallampati class I, tongue protrudes centrally in palate elevates symmetrically.  Neck circumference is 17-1/2 inches.  She has a  mild overbite.  She has a mildly deviated septum to the left.  Chest: Clear to auscultation without wheezing, rhonchi or crackles noted.  Heart: S1+S2+0, regular and normal without murmurs, rubs or gallops noted.   Abdomen: Soft, non-tender and non-distended with normal bowel sounds appreciated on auscultation.  Extremities: There is trace Edema of the left foot and ankle, otherwise no significant edema. Unremarkable appearing scars left dorsum of the foot.   Skin: Warm and dry without trophic changes noted.   Musculoskeletal: exam reveals no obvious joint deformities, tenderness or joint swelling or erythema.   Neurologically:  Mental status: The patient is awake, alert and oriented in all 4 spheres. Janice Wolf immediate and remote memory, attention, language skills and fund of knowledge are appropriate. There is no evidence of aphasia, agnosia, apraxia or anomia. Speech is clear with normal prosody and enunciation. Thought process is linear. Mood is normal and affect is normal.  Cranial nerves II - XII are as described above under HEENT exam.  Motor exam: Normal bulk, strength and tone is noted. There is no tremor, Romberg is negative. Reflexes are 2+ throughout. Fine motor skills and coordination: grossly intact.  Cerebellar testing: No dysmetria or intention tremor. There is no truncal or gait ataxia.  Sensory exam: intact to light touch in the upper and lower extremities.  Gait, station and balance: She stands easily. No veering to one side is noted. No leaning to one side is noted. Posture is age-appropriate and stance is narrow based. Gait shows normal stride length and normal pace. No problems turning are noted. Tandem walk is unremarkable.                Assessment and Plan:   In summary, Janice Wolf is a very pleasant 39  y.o.-year old female with an underlying medical history of vitamin D deficiency, migraine headaches, irritable bowel syndrome, hypothyroidism, reflux, low back pain, left foot pain with status post left foot surgery, and morbid obesity with a BMI of over 78, whose history and physical exam are concerning for obstructive sleep apnea (OSA). Rotating shift work is likely also a contributor to Janice Wolf sleep disturbance and excessive somnolence.  I had a long chat with the patient about my findings and the diagnosis of OSA, its prognosis and treatment options. We talked about medical treatments, surgical interventions and non-pharmacological approaches. I explained in particular the risks and ramifications of untreated moderate to severe OSA, especially with respect to developing cardiovascular disease down the Road, including congestive heart failure, difficult to treat hypertension, cardiac arrhythmias, or stroke. Even type 2 diabetes has, in part, been linked to untreated OSA. Symptoms of untreated OSA include daytime sleepiness, memory problems, mood irritability and mood disorder such as depression and anxiety, lack of energy, as well as recurrent headaches, especially morning headaches. We talked about trying to maintain a healthy lifestyle in general, as well as the importance of weight control. We also talked about the importance of good sleep hygiene. I recommended the following at this time: sleep study.  I explained the sleep test procedure to the patient and also outlined possible surgical and non-surgical treatment options of OSA, including the use of a custom-made dental device (which would require a referral to a specialist dentist or oral surgeon), upper airway surgical options, such as traditional UPPP or a novel less invasive surgical option in the form of Inspire hypoglossal nerve stimulation (which would involve a referral to an ENT surgeon). I also explained the CPAP treatment option  to the patient,  who indicated that she would be willing to try CPAP if the need arises. I explained the importance of being compliant with PAP treatment, not only for insurance purposes but primarily to improve Janice Wolf symptoms, and for the patient's long term health benefit, including to reduce Janice Wolf cardiovascular risks. I answered all Janice Wolf questions today and the patient was in agreement. I plan to see Janice Wolf back after the sleep study is completed and encouraged Janice Wolf to call with any interim questions, concerns, problems or updates.   Thank you very much for allowing me to participate in the care of this nice patient. If I can be of any further assistance to you please do not hesitate to call me at [phone].  Sincerely,   Star Age, MD, PhD

## 2019-05-09 NOTE — Progress Notes (Signed)
Office: [phone]  /  Fax: [phone] TeleHealth Visit:  Janice Wolf has verbally consented to this TeleHealth visit today. The patient is located at home, the provider is located at the [employer] [facility] office. The participants in this visit include the listed provider and patient and any and all parties involved. The visit was conducted today via FaceTime.  HPI:  Chief Complaint: OBESITY Janice Wolf is here to discuss her progress with her obesity treatment plan. She is on the Category 3 Plan and states she is following her eating plan approximately 60 % of the time. She states she is walking for exercise. Janice Wolf's most recent weight is 295 pounds (05/06/18). She went to [facility] for the holiday and she did not eat completely on the plan. 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 She does better getting all of her food in when working days.  Vitamin D deficiency Janice Wolf has a diagnosis of vitamin D deficiency. She has no nausea, vomiting or muscle weakness on vitamin D. She reports fatigue.  Depression with emotional eating  Janice Wolf reports that she feels no difference in her depression since starting Zoloft. She is not sleeping well. Janice Wolf has no suicidal or homicidal ideations.  ASSESSMENT AND PLAN:  Vitamin D deficiency - Plan: Vitamin D, Ergocalciferol, (DRISDOL) 1.25 MG (50000 UT) CAPS capsule  Other depression, emotional eating - Plan: sertraline (ZOLOFT) 50 MG tablet  Class 3 severe obesity with serious comorbidity and body mass index (BMI) of 45.0 to 49.9 in adult, unspecified obesity type (Janice Wolf)  PLAN:  Vitamin D deficiency Low Vitamin D level contributes to fatigue and are associated with obesity, breast, and colon cancer. Janice Wolf agrees to continue to take prescription Vitamin D @50 ,000 IU every week #4 with no refills and she will follow-up for routine testing of vitamin D, at least 2-3 times per year to avoid  over-replacement. Janice Wolf agrees to follow up as directed.  Depression with emotional eating  Behavior modification techniques were discussed today to help Janice Wolf deal with her emotional/non-hunger eating behaviors. Janice Wolf agrees to increase Zoloft to 75 mg (50 mg 1 1/2 tablets daily) #45 with no refills and follow up as directed. Orders and follow up as documented in patient record.   Obesity Janice Wolf is currently in the action stage of change. As such, her goal is to continue with weight loss efforts. She has agreed to keeping a food journal and adhering to recommended goals of 1500 calories and 95 grams of protein daily. Grizel has been instructed to work up to a goal of 150 minutes of combined cardio and strengthening exercise per week for weight loss and overall health benefits. We discussed the following Behavioral Modification Strategies today: meal planning and cooking strategies.  Janice Wolf has agreed to call back for follow-up with our clinic. She was informed of the importance of frequent follow-up visits to maximize her success with intensive lifestyle modifications for her multiple health conditions.  ALLERGIES: Allergies  Allergen Reactions  . Aspirin Nausea And Vomiting  . Latex Other (See Comments)    SKIN IRRITATION  . Other Rash    PERFUMES    MEDICATIONS: Current Outpatient Medications on File Prior to Visit  Medication Sig Dispense Refill  . esomeprazole (NEXIUM) 20 MG capsule Take 20 mg by mouth daily at 12 noon.    [name] levonorgestrel (MIRENA) 20 MCG/24HR IUD 1 each by Intrauterine route once.    [name] levothyroxine (SYNTHROID) 25 MCG tablet Take 2  tablets (50 mcg total) by mouth daily before breakfast. 30 tablet 0  . Multiple Vitamins-Minerals (MULTIVITAMIN WITH MINERALS) tablet Take 1 tablet by mouth daily.     No current facility-administered medications on file prior to visit.    PAST MEDICAL HISTORY: Past Medical History:  Diagnosis Date  . Abnormal  thyroid blood test    no treatment yet, per pt.  . Anxiety   . Back pain   . Complication of anesthesia    was hard to wake up after T & A 12/2015; had to stay overnight  . Dental crowns present   . Depression   . Dizziness   . Fatty liver   . Gallbladder problem   . GERD (gastroesophageal reflux disease)   . Hypothyroidism   . IBS (irritable bowel syndrome)   . Migraines    migraines  . Painful orthopaedic hardware ([place]) 09/2016   left foot  . PONV (postoperative nausea and vomiting)   . Stuffy nose 09/03/2016  . Vitamin D deficiency     PAST SURGICAL HISTORY: Past Surgical History:  Procedure Laterality Date  . CHOLECYSTECTOMY     age 61  . HARDWARE REMOVAL Left 10/01/2016   Procedure: HARDWARE REMOVAL left foot;  Surgeon: [name], MD;  Location: [facility];  Service: Orthopedics;  Laterality: Left;  . TARSAL METATARSAL ARTHRODESIS Left 02/20/2016   Procedure: FIRST AND SECOND TARSAL METATARSAL ARTHRODESIS;  Surgeon: [name], MD;  Location: [facility];  Service: Orthopedics;  Laterality: Left;  . TONSILLECTOMY AND ADENOIDECTOMY  12/2015    SOCIAL HISTORY: Social History   Tobacco Use  . Smoking status: Never Smoker  . Smokeless tobacco: Never Used  Substance Use Topics  . Alcohol use: No  . Drug use: No    FAMILY HISTORY: Family History  Problem Relation Age of Onset  . Diabetes Mother   . Hypertension Mother   . Anxiety disorder Mother   . Obesity Father   . Hyperlipidemia Father   . Hypertension Father   . Diabetes Father     ROS: Review of Systems  Constitutional: Positive for malaise/fatigue. Negative for weight loss.  Gastrointestinal: Negative for nausea and vomiting.  Musculoskeletal:       Negative for muscle weakness  Psychiatric/Behavioral: Positive for depression. Negative for suicidal ideas. The patient has insomnia.     PHYSICAL EXAM: There were no vitals taken for this visit. There is no height  or weight on file to calculate BMI. Physical Exam Vitals reviewed.  Constitutional:      General: She is not in acute distress.    Appearance: Normal appearance. She is well-developed. She is obese.  Pulmonary:     Effort: Pulmonary effort is normal.  Neurological:     Mental Status: She is alert and oriented to person, place, and time.  Psychiatric:        Mood and Affect: Mood normal.        Behavior: Behavior normal.        Thought Content: Thought content does not include homicidal or suicidal ideation.     RECENT LABS AND TESTS: BMET No results found for: NA, K, CL, CO2, GLUCOSE, BUN, CREATININE, CALCIUM, GFRNONAA, GFRAA Lab Results  Component Value Date   HGBA1C 5.4 10/25/2018   HGBA1C 5.6 07/14/2018   Lab Results  Component Value Date   INSULIN 52.2 (H) 10/25/2018   INSULIN 35.5 (H) 07/14/2018   CBC No results found for: WBC, RBC, HGB, HCT, PLT,  MCV, MCH, MCHC, RDW, LYMPHSABS, MONOABS, EOSABS, BASOSABS Iron/TIBC/Ferritin/ %Sat No results found for: IRON, TIBC, FERRITIN, IRONPCTSAT Lipid Panel  No results found for: CHOL, TRIG, HDL, CHOLHDL, VLDL, LDLCALC, LDLDIRECT Hepatic Function Panel  No results found for: PROT, ALBUMIN, AST, ALT, ALKPHOS, BILITOT, BILIDIR, IBILI    Component Value Date/Time   TSH 2.750 10/25/2018 1011     Ref. Range 10/25/2018 10:11  Vitamin D, 25-Hydroxy Latest Ref Range: 30.0 - 100.0 ng/mL 33.1    I, Doreene Nest, am acting as Location manager for Abby Potash, PA-C I, Abby Potash, PA-C have reviewed above note and agree with its content

## 2019-05-09 NOTE — Patient Instructions (Signed)

## 2019-05-28 ENCOUNTER — Other Ambulatory Visit (INDEPENDENT_AMBULATORY_CARE_PROVIDER_SITE_OTHER): Payer: Self-pay | Admitting: Physician Assistant

## 2019-05-28 DIAGNOSIS — F3289 Other specified depressive episodes: Secondary | ICD-10-CM

## 2019-06-25 ENCOUNTER — Ambulatory Visit (INDEPENDENT_AMBULATORY_CARE_PROVIDER_SITE_OTHER): Payer: 59 | Admitting: Neurology

## 2019-06-25 DIAGNOSIS — G4719 Other hypersomnia: Secondary | ICD-10-CM

## 2019-06-25 DIAGNOSIS — R0683 Snoring: Secondary | ICD-10-CM

## 2019-06-25 DIAGNOSIS — Z9189 Other specified personal risk factors, not elsewhere classified: Secondary | ICD-10-CM

## 2019-06-25 DIAGNOSIS — Z6841 Body Mass Index (BMI) 40.0 and over, adult: Secondary | ICD-10-CM

## 2019-06-25 DIAGNOSIS — G4720 Circadian rhythm sleep disorder, unspecified type: Secondary | ICD-10-CM

## 2019-06-25 DIAGNOSIS — G4733 Obstructive sleep apnea (adult) (pediatric): Secondary | ICD-10-CM | POA: Diagnosis not present

## 2019-06-25 DIAGNOSIS — G4726 Circadian rhythm sleep disorder, shift work type: Secondary | ICD-10-CM

## 2019-07-06 NOTE — Addendum Note (Signed)
Addended by: Star Age on: 07/06/2019 08:17 AM   Modules accepted: Orders

## 2019-07-06 NOTE — Progress Notes (Signed)
Patient referred by Dr. Juleen China, seen by me on 05/09/19, diagnostic PSG on 06/25/19.    Please call and notify the patient that the recent sleep study showed obstructive sleep apnea. OSA is overall mild, gets worse during REM sleep and worth treating to see if she feels better after treatment (ie her sleepiness, and also her wt loss success). To that end, I recommend treatment for this in the form of autoPAP, which means, that we don't have to bring her back for a second sleep study with CPAP, but will let him try an autoPAP machine at home, through a DME company (of her choice, or as per insurance requirement). The DME representative will educate her on how to use the machine, how to put the mask on, etc. I have placed an order in the chart. Please send referral, talk to patient, send report to referring MD. We will need a FU in sleep clinic for 10 weeks post-PAP set up, please arrange that with me or one of our NPs. Thanks,   Janice Age, MD, PhD [facility])

## 2019-07-06 NOTE — Procedures (Signed)
PATIENT'S NAME:  Janice Wolf, Janice Wolf DOB:      [birth year]      MR#:    [id number]     DATE OF RECORDING: 06/25/2019 REFERRING M.D.:  Jonathon Jordan MD Study Performed:   Baseline Polysomnogram HISTORY: 39 year old woman with a history of vitamin D deficiency, migraine headaches, irritable bowel syndrome, hypothyroidism, reflux, low back pain, left foot pain with status post left foot surgery, and morbid obesity with a BMI of over 45, who reports snoring and excessive daytime somnolence. The patient endorsed the Epworth Sleepiness Scale at 19 points. The patient's weight 302 pounds with a height of 67 (inches), resulting in a BMI of 47.4 kg/m2. The patient's neck circumference measured 17.5 inches.  CURRENT MEDICATIONS: Voltaren, Nexium, Neurontin, Advil, Mirena, Synthroid, Xylocaine, Zoloft, Ultram, Drisdol   PROCEDURE:  This is a multichannel digital polysomnogram utilizing the Somnostar 11.2 system.  Electrodes and sensors were applied and monitored per AASM Specifications.   EEG, EOG, Chin and Limb EMG, were sampled at 200 Hz.  ECG, Snore and Nasal Pressure, Thermal Airflow, Respiratory Effort, CPAP Flow and Pressure, Oximetry was sampled at 50 Hz. Digital video and audio were recorded.      BASELINE STUDY  Lights Out was at 22:42 and Lights On at 05:00.  Total recording time (TRT) was 378.5 minutes, with a total sleep time (TST) of 342 minutes.   The patient's sleep latency was 15 minutes.  REM latency was 112 minutes.  The sleep efficiency was 90.4 %.     SLEEP ARCHITECTURE: WASO (Wake after sleep onset) was 21 minutes with minimal to mild sleep fragmentation noted. There were 26 minutes in Stage N1, 82 minutes Stage N2, 168 minutes Stage N3 and 66 minutes in Stage REM.  The percentage of Stage N1 was 7.6%, Stage N2 was 24.%, Stage N3 was 49.1%, which is increased, and Stage R (REM sleep) was 19.3%, which is (near)-normal. The arousals were noted as: 44 were spontaneous, 0 were associated with PLMs,  13 were associated with respiratory events.  RESPIRATORY ANALYSIS:  There were a total of 44 respiratory events:  2 obstructive apneas, 0 central apneas and 0 mixed apneas with a total of 2 apneas and an apnea index (AI) of .4 /hour. There were 42 hypopneas with a hypopnea index of 7.4 /hour. The patient also had 0 respiratory event related arousals (RERAs).      The total APNEA/HYPOPNEA INDEX (AHI) was 7.7/hour and the total RESPIRATORY DISTURBANCE INDEX was  7.7 /hour.  34 events occurred in REM sleep and 16 events in NREM. The REM AHI was  30.9 /hour, versus a non-REM AHI of 2.2. The patient spent 0 minutes of total sleep time in the supine position and 342 minutes in non-supine.. The supine AHI was n/a versus a non-supine AHI of 7.8.  OXYGEN SATURATION & C02:  The [place] baseline 02 saturation was 99%, with the lowest being 86%. Time spent below 89% saturation equaled 1 minutes.  PERIODIC LIMB MOVEMENTS: The patient had a total of 11 Periodic Limb Movements.  The Periodic Limb Movement (PLM) index was 1.9 and the PLM Arousal index was 0/hour.  Audio and video analysis did not show any abnormal or unusual movements, behaviors, phonations or vocalizations. The patient took no bathroom breaks. Moderate snoring was noted. The EKG was in keeping with normal sinus rhythm (NSR).  Post-study, the patient indicated that sleep was the same as usual.   IMPRESSION:  1. Obstructive Sleep Apnea (OSA) 2. Dysfunctions associated with  sleep stages or arousal from sleep  RECOMMENDATIONS:  1. This study demonstrates overall mild obstructive sleep apnea, more pronounced in REM sleep with a total AHI of 7.7/hour, REM AHI of 30.9/hour, and O2 nadir of 86%. The absence of supine sleep likely underestimates her AHI and O2 nadir. Given the patient's medical history and sleep related complaints, treatment with positive airway pressure is recommended; this can be achieved in the form of autoPAP. Alternatively, a  full-night CPAP titration study would allow optimization of therapy if needed. Other treatment options may include avoidance of supine sleep position along with weight loss, upper airway or jaw surgery in selected patients or the use of an oral appliance in certain patients. ENT evaluation and/or consultation with a maxillofacial surgeon or dentist may be feasible in some instances.  Please note that untreated obstructive sleep apnea may carry additional perioperative morbidity. Patients with significant obstructive sleep apnea should receive perioperative PAP therapy and the surgeons and particularly the anesthesiologist should be informed of the diagnosis and the severity of the sleep disordered breathing. 2. This study shows sleep fragmentation and abnormal sleep stage percentages; these are nonspecific findings and per se do not signify an intrinsic sleep disorder or a cause for the patient's sleep-related symptoms. Causes include (but are not limited to) the first night effect of the sleep study, circadian rhythm disturbances, medication effect or an underlying mood disorder or medical problem.  3. The patient should be cautioned not to drive, work at heights, or operate dangerous or heavy equipment when tired or sleepy. Review and reiteration of good sleep hygiene measures should be pursued with any patient. 4. The patient will be seen in follow-up by Dr. Rexene Alberts at [facility] for discussion of the test results and further management strategies. The referring provider will be notified of the test results.  I certify that I have reviewed the entire raw data recording prior to the issuance of this report in accordance with the Standards of Accreditation of the American Academy of Sleep Medicine (AASM)   Star Age, MD, PhD Diplomat, American Board of Neurology and Sleep Medicine (Neurology and Sleep Medicine)

## 2019-07-11 ENCOUNTER — Telehealth: Payer: Self-pay

## 2019-07-11 NOTE — Telephone Encounter (Signed)
I reached out to the pt and advised of results. She is agreeable to using auto pap and understands insurance requirement of 4 hours every night.  Pt's f/u has been made, letter mailed and order sent to [facility].

## 2019-07-11 NOTE — Telephone Encounter (Signed)
-----   Message from Star Age, MD sent at 07/06/2019  8:17 AM EST ----- Patient referred by Dr. Juleen China, seen by me on 05/09/19, diagnostic PSG on 06/25/19.    Please call and notify the patient that the recent sleep study showed obstructive sleep apnea. OSA is overall mild, gets worse during REM sleep and worth treating to see if she feels better after treatment (ie her sleepiness, and also her wt loss success). To that end, I recommend treatment for this in the form of autoPAP, which means, that we don't have to bring her back for a second sleep study with CPAP, but will let him try an autoPAP machine at home, through a DME company (of her choice, or as per insurance requirement). The DME representative will educate her on how to use the machine, how to put the mask on, etc. I have placed an order in the chart. Please send referral, talk to patient, send report to referring MD. We will need a FU in sleep clinic for 10 weeks post-PAP set up, please arrange that with me or one of our NPs. Thanks,   Star Age, MD, PhD [facility])

## 2019-07-18 ENCOUNTER — Other Ambulatory Visit: Payer: Self-pay

## 2019-07-18 ENCOUNTER — Ambulatory Visit (INDEPENDENT_AMBULATORY_CARE_PROVIDER_SITE_OTHER): Payer: 59 | Admitting: Family Medicine

## 2019-07-18 ENCOUNTER — Encounter (INDEPENDENT_AMBULATORY_CARE_PROVIDER_SITE_OTHER): Payer: Self-pay | Admitting: Family Medicine

## 2019-07-18 VITALS — BP 126/76 | HR 81 | Temp 98.0°F | Ht 67.0 in | Wt 304.0 lb

## 2019-07-18 DIAGNOSIS — E8881 Metabolic syndrome: Secondary | ICD-10-CM

## 2019-07-18 DIAGNOSIS — E559 Vitamin D deficiency, unspecified: Secondary | ICD-10-CM | POA: Diagnosis not present

## 2019-07-18 DIAGNOSIS — Z9189 Other specified personal risk factors, not elsewhere classified: Secondary | ICD-10-CM

## 2019-07-18 DIAGNOSIS — F3289 Other specified depressive episodes: Secondary | ICD-10-CM

## 2019-07-18 DIAGNOSIS — Z6841 Body Mass Index (BMI) 40.0 and over, adult: Secondary | ICD-10-CM

## 2019-07-18 MED ORDER — SERTRALINE HCL 50 MG PO TABS: 75.0000 mg | ORAL_TABLET | ORAL | 0 refills | Status: DC

## 2019-07-18 MED ORDER — VITAMIN D (ERGOCALCIFEROL) 1.25 MG (50000 UNIT) PO CAPS: 50000.0000 [IU] | ORAL_CAPSULE | ORAL | 0 refills | Status: DC

## 2019-07-18 NOTE — Progress Notes (Signed)
Chief Complaint:   OBESITY Janice Wolf is here to discuss her progress with her obesity treatment plan along with follow-up of her obesity related diagnoses. [name] is on keeping a food journal and adhering to recommended goals of 1500 calories and 95 grams of protein and states she is following her eating plan approximately 0% of the time. [name] states she is exercising for 0 minutes 0 times per week.  Today's visit was #: 16 Starting weight: 304 lbs Starting date: 07/14/2018 Today's weight: 304 lbs Today's date: 07/18/2019 Total lbs lost to date: 0 Total lbs lost since last in-office visit: 0  Interim History: [name] has had a very difficult two weeks.  Her daughter tried to kill herself, so she was admitted to [facility].  Subjective:   1. Vitamin D deficiency Waylon's Vitamin D level was 33.1 on 10/25/2018. She is currently taking vit D. She denies nausea, vomiting or muscle weakness.  2. Insulin resistance Arlean has a diagnosis of insulin resistance based on her elevated fasting insulin level >5. She continues to work on diet and exercise to decrease her risk of diabetes.  Lab Results  Component Value Date   INSULIN 52.2 (H) 10/25/2018   INSULIN 35.5 (H) 07/14/2018   Lab Results  Component Value Date   HGBA1C 5.4 10/25/2018   3. Other depression, emotional eating [name] never got her prescription for 75 mg Zoloft after last appointment. She has been off her medication for at least 1 month.  4. At risk for diabetes mellitus [name] is at higher than average risk for developing diabetes due to her obesity.   Assessment/Plan:   1. Vitamin D deficiency Low Vitamin D level contributes to fatigue and are associated with obesity, breast, and colon cancer. She agrees to continue to take prescription Vitamin D @50 ,000 IU every week and will follow-up for routine testing of Vitamin D, at least 2-3 times per year to avoid over-replacement. - Vitamin D,  Ergocalciferol, (DRISDOL) 1.25 MG (50000 UNIT) CAPS capsule; Take 1 capsule (50,000 Units total) by mouth every 7 (seven) days.  Dispense: 4 capsule; Refill: 0 - VITAMIN D 25 Hydroxy (Vit-D Deficiency, Fractures) - TSH  2. Insulin resistance [name] will continue to work on weight loss, exercise, and decreasing simple carbohydrates to help decrease the risk of diabetes. [name] agreed to follow-up with [place] as directed to closely monitor her progress. - Hemoglobin A1c - Insulin, random - TSH  3. Other depression, emotional eating Will refill Zoloft 75 mg daily, as below. - sertraline (ZOLOFT) 50 MG tablet; Take 1.5 tablets (75 mg total) by mouth as directed. Take 75 mg daily  Dispense: 45 tablet; Refill: 0 - TSH  4. At risk for diabetes mellitus [name] was given approximately 15 minutes of diabetes education and counseling today. We discussed intensive lifestyle modifications today with an emphasis on weight loss as well as increasing exercise and decreasing simple carbohydrates in her diet. We also reviewed medication options with an emphasis on risk versus benefit of those discussed.   Repetitive spaced learning was employed today to elicit superior memory formation and behavioral change.  5. Class 3 severe obesity with serious comorbidity and body mass index (BMI) of 45.0 to 49.9 in adult, unspecified obesity type [facility]) [name] is currently in the action stage of change. As such, her goal is to continue with weight loss efforts. She has agreed to the Category 3 Plan.   Exercise goals: No exercise has been prescribed at this time.  Behavioral  modification strategies: increasing lean protein intake, increasing vegetables, meal planning and cooking strategies, ways to avoid boredom eating and planning for success.  [name] has agreed to follow-up with our clinic in 2 weeks. She was informed of the importance of frequent follow-up visits to maximize her success with intensive lifestyle  modifications for her multiple health conditions.   [name] was informed we would discuss her lab results at her next visit unless there is a critical issue that needs to be addressed sooner. [name] agreed to keep her next visit at the agreed upon time to discuss these results.  Objective:   Blood pressure 126/76, pulse 81, temperature 98 F (36.7 C), temperature source Oral, height 5\' 7"  (1.702 m), weight (!) 304 lb (137.9 kg), SpO2 94 %. Body mass index is 47.61 kg/m.  General: Cooperative, alert, well developed, in no acute distress. HEENT: Conjunctivae and lids unremarkable. Cardiovascular: Regular rhythm.  Lungs: Normal work of breathing. Neurologic: No focal deficits.   Lab Results  Component Value Date   HGBA1C 5.4 10/25/2018   HGBA1C 5.6 07/14/2018   Lab Results  Component Value Date   INSULIN 52.2 (H) 10/25/2018   INSULIN 35.5 (H) 07/14/2018   Lab Results  Component Value Date   TSH 2.750 10/25/2018   Attestation Statements:   Reviewed by clinician on day of visit: allergies, medications, problem list, medical history, surgical history, family history, social history, and previous encounter notes.  I, Water quality scientist, CMA, am acting as transcriptionist for Janice Common, MD. I have reviewed the above documentation for accuracy and completeness, and I agree with the above. - Janice Qua, MD

## 2019-07-19 LAB — HEMOGLOBIN A1C
Est. average glucose Bld gHb Est-mCnc: 108 mg/dL
Hgb A1c MFr Bld: 5.4 % (ref 4.8–5.6)

## 2019-07-19 LAB — TSH: TSH: 3.18 u[IU]/mL (ref 0.450–4.500)

## 2019-07-19 LAB — INSULIN, RANDOM: INSULIN: 29.1 u[IU]/mL — ABNORMAL HIGH (ref 2.6–24.9)

## 2019-07-19 LAB — VITAMIN D 25 HYDROXY (VIT D DEFICIENCY, FRACTURES): Vit D, 25-Hydroxy: 28.3 ng/mL — ABNORMAL LOW (ref 30.0–100.0)

## 2019-07-27 ENCOUNTER — Ambulatory Visit: Payer: 59 | Attending: Internal Medicine

## 2019-07-27 DIAGNOSIS — Z23 Encounter for immunization: Secondary | ICD-10-CM

## 2019-07-27 NOTE — Progress Notes (Signed)
   Covid-19 Vaccination Clinic  Name:  Janice Wolf    MRN: [id number] DOB: [birth year]  07/27/2019  Janice Wolf was observed post Covid-19 immunization for 15 minutes without incident. She was provided with Vaccine Information Sheet and instruction to access the V-Safe system.   Janice Wolf was instructed to call 911 with any severe reactions post vaccine: [name] Difficulty breathing  . Swelling of face and throat  . A fast heartbeat  . A bad rash all over body  . Dizziness and weakness   Immunizations Administered    Name Date Dose VIS Date Route   Pfizer COVID-19 Vaccine 07/27/2019  4:06 PM 0.3 mL 04/14/2019 Intramuscular   Manufacturer: [place]   Lot: [id number]   [place]: [id number]

## 2019-08-03 ENCOUNTER — Ambulatory Visit (INDEPENDENT_AMBULATORY_CARE_PROVIDER_SITE_OTHER): Payer: 59 | Admitting: Family Medicine

## 2019-08-05 ENCOUNTER — Other Ambulatory Visit (INDEPENDENT_AMBULATORY_CARE_PROVIDER_SITE_OTHER): Payer: Self-pay | Admitting: Family Medicine

## 2019-08-05 DIAGNOSIS — E559 Vitamin D deficiency, unspecified: Secondary | ICD-10-CM

## 2019-08-08 ENCOUNTER — Other Ambulatory Visit (INDEPENDENT_AMBULATORY_CARE_PROVIDER_SITE_OTHER): Payer: Self-pay | Admitting: Family Medicine

## 2019-08-08 DIAGNOSIS — F3289 Other specified depressive episodes: Secondary | ICD-10-CM

## 2019-08-23 ENCOUNTER — Ambulatory Visit: Payer: Self-pay

## 2019-08-24 ENCOUNTER — Encounter (INDEPENDENT_AMBULATORY_CARE_PROVIDER_SITE_OTHER): Payer: Self-pay | Admitting: Family Medicine

## 2019-08-24 ENCOUNTER — Other Ambulatory Visit: Payer: Self-pay

## 2019-08-24 ENCOUNTER — Ambulatory Visit (INDEPENDENT_AMBULATORY_CARE_PROVIDER_SITE_OTHER): Payer: 59 | Admitting: Family Medicine

## 2019-08-24 VITALS — BP 105/70 | HR 74 | Temp 97.7°F | Ht 67.0 in | Wt 310.0 lb

## 2019-08-24 DIAGNOSIS — Z9189 Other specified personal risk factors, not elsewhere classified: Secondary | ICD-10-CM | POA: Diagnosis not present

## 2019-08-24 DIAGNOSIS — E559 Vitamin D deficiency, unspecified: Secondary | ICD-10-CM

## 2019-08-24 DIAGNOSIS — F3289 Other specified depressive episodes: Secondary | ICD-10-CM

## 2019-08-24 DIAGNOSIS — Z6841 Body Mass Index (BMI) 40.0 and over, adult: Secondary | ICD-10-CM

## 2019-08-24 MED ORDER — VITAMIN D (ERGOCALCIFEROL) 1.25 MG (50000 UNIT) PO CAPS: 50000.0000 [IU] | ORAL_CAPSULE | ORAL | 0 refills | Status: DC

## 2019-08-24 MED ORDER — SERTRALINE HCL 50 MG PO TABS: 50.0000 mg | ORAL_TABLET | Freq: Every day | ORAL | 0 refills | Status: DC

## 2019-08-28 ENCOUNTER — Ambulatory Visit: Payer: Self-pay

## 2019-08-28 NOTE — Progress Notes (Signed)
Chief Complaint:   OBESITY Janice Wolf is here to discuss her progress with her obesity treatment plan along with follow-up of her obesity related diagnoses. Janice Wolf is on the Category 3 Plan and states she is following her eating plan approximately 60-70% of the time. Janice Wolf states she is doing daily exercise for 30 minutes 7 times per week.  Today's visit was #: 63 Starting weight: 304 lbs Starting date: 07/14/2018 Today's weight: 310 lbs Today's date: 08/24/2019 Total lbs lost to date: 0 Total lbs lost since last in-office visit: 0  Interim History: Janice Wolf voices that work has still been stressful and home life has been better in terms of her daughter.  Both she and her husband went to the [employer] over the weekend and did not follow the plan.  Has most of the food for Category 3 at home.  Subjective:   1. Vitamin D deficiency Janice Wolf's Vitamin D level was 28.3 on 07/18/2019. She is currently taking prescription vitamin D 50,000 IU each week. She denies nausea, vomiting or muscle weakness. She endorses fatigue.  2. Other depression, with emotional eating Janice Wolf says she has notices no change in symptoms with sertraline 75 mg versus 50 mg.    3. At risk for osteoporosis Janice Wolf is at higher risk of osteopenia and osteoporosis due to Vitamin D deficiency.   Assessment/Plan:   1. Vitamin D deficiency Low Vitamin D level contributes to fatigue and are associated with obesity, breast, and colon cancer. She agrees to continue to take prescription Vitamin D @50 ,000 IU every week and will follow-up for routine testing of Vitamin D, at least 2-3 times per year to avoid over-replacement. - Vitamin D, Ergocalciferol, (DRISDOL) 1.25 MG (50000 UNIT) CAPS capsule; Take 1 capsule (50,000 Units total) by mouth every 7 (seven) days.  Dispense: 4 capsule; Refill: 0  2. Other depression, with emotional eating Behavior modification techniques were discussed today to help Janice Wolf deal  with her emotional/non-hunger eating behaviors.  Orders and follow up as documented in patient record.  - sertraline (ZOLOFT) 50 MG tablet; Take 1 tablet (50 mg total) by mouth daily.  Dispense: 30 tablet; Refill: 0  3. At risk for osteoporosis Janice Wolf was given approximately 15 minutes of osteoporosis prevention counseling today. Janice Wolf is at risk for osteopenia and osteoporosis due to her Vitamin D deficiency. She was encouraged to take her Vitamin D and follow her higher calcium diet and increase strengthening exercise to help strengthen her bones and decrease her risk of osteopenia and osteoporosis.  Repetitive spaced learning was employed today to elicit superior memory formation and behavioral change.  4. Class 3 severe obesity with serious comorbidity and body mass index (BMI) of 45.0 to 49.9 in adult, unspecified obesity type [facility]) Janice Wolf is currently in the action stage of change. As such, her goal is to continue with weight loss efforts. She has agreed to the Category 3 Plan.   Exercise goals: As is.  Behavioral modification strategies: increasing lean protein intake, increasing vegetables, meal planning and cooking strategies, keeping healthy foods in the home and planning for success.  Janice Wolf has agreed to follow-up with our clinic in 4 weeks. She was informed of the importance of frequent follow-up visits to maximize her success with intensive lifestyle modifications for her multiple health conditions.   Objective:   Blood pressure 105/70, pulse 74, temperature 97.7 F (36.5 C), temperature source Oral, height 5\' 7"  (1.702 m), weight (!) 310 lb (140.6 kg), SpO2 96 %. Body mass  index is 48.55 kg/m.  General: Cooperative, alert, well developed, in no acute distress. HEENT: Conjunctivae and lids unremarkable. Cardiovascular: Regular rhythm.  Lungs: Normal work of breathing. Neurologic: No focal deficits.   Lab Results  Component Value Date   HGBA1C 5.4 07/18/2019    HGBA1C 5.4 10/25/2018   HGBA1C 5.6 07/14/2018   Lab Results  Component Value Date   INSULIN 29.1 (H) 07/18/2019   INSULIN 52.2 (H) 10/25/2018   INSULIN 35.5 (H) 07/14/2018   Lab Results  Component Value Date   TSH 3.180 07/18/2019   Attestation Statements:   Reviewed by clinician on day of visit: allergies, medications, problem list, medical history, surgical history, family history, social history, and previous encounter notes.  I, Water quality scientist, CMA, am acting as transcriptionist for Coralie Common, MD.  I have reviewed the above documentation for accuracy and completeness, and I agree with the above. - Ilene Qua, MD

## 2019-08-30 ENCOUNTER — Ambulatory Visit: Payer: 59 | Attending: Internal Medicine

## 2019-08-30 DIAGNOSIS — Z23 Encounter for immunization: Secondary | ICD-10-CM

## 2019-08-30 NOTE — Progress Notes (Signed)
   Covid-19 Vaccination Clinic  Name:  Janice Wolf    MRN: [id number] DOB: Oct 07, 1980  08/30/2019  [name] was observed post Covid-19 immunization for 15 minutes without incident. She was provided with Vaccine Information Sheet and instruction to access the V-Safe system.   [name] was instructed to call 911 with any severe reactions post vaccine: [name] Difficulty breathing  . Swelling of face and throat  . A fast heartbeat  . A bad rash all over body  . Dizziness and weakness   Immunizations Administered    Name Date Dose VIS Date Route   Pfizer COVID-19 Vaccine 08/30/2019  9:00 AM 0.3 mL 06/28/2018 Intramuscular   Manufacturer: [place]   Lot: [id number]   [place]: [id number]

## 2019-09-21 ENCOUNTER — Other Ambulatory Visit: Payer: Self-pay

## 2019-09-21 ENCOUNTER — Encounter (INDEPENDENT_AMBULATORY_CARE_PROVIDER_SITE_OTHER): Payer: Self-pay | Admitting: Family Medicine

## 2019-09-21 ENCOUNTER — Ambulatory Visit (INDEPENDENT_AMBULATORY_CARE_PROVIDER_SITE_OTHER): Payer: 59 | Admitting: Family Medicine

## 2019-09-21 VITALS — BP 120/84 | HR 75 | Temp 97.5°F | Ht 67.0 in | Wt 308.0 lb

## 2019-09-21 DIAGNOSIS — Z9189 Other specified personal risk factors, not elsewhere classified: Secondary | ICD-10-CM | POA: Diagnosis not present

## 2019-09-21 DIAGNOSIS — F419 Anxiety disorder, unspecified: Secondary | ICD-10-CM

## 2019-09-21 DIAGNOSIS — Z6841 Body Mass Index (BMI) 40.0 and over, adult: Secondary | ICD-10-CM

## 2019-09-21 DIAGNOSIS — F329 Major depressive disorder, single episode, unspecified: Secondary | ICD-10-CM

## 2019-09-21 DIAGNOSIS — F32A Depression, unspecified: Secondary | ICD-10-CM

## 2019-09-21 DIAGNOSIS — E559 Vitamin D deficiency, unspecified: Secondary | ICD-10-CM | POA: Diagnosis not present

## 2019-09-21 MED ORDER — VITAMIN D (ERGOCALCIFEROL) 1.25 MG (50000 UNIT) PO CAPS: 50000.0000 [IU] | ORAL_CAPSULE | ORAL | 0 refills | Status: DC

## 2019-09-21 MED ORDER — SERTRALINE HCL 100 MG PO TABS: 100.0000 mg | ORAL_TABLET | Freq: Every day | ORAL | 0 refills | Status: DC

## 2019-09-25 ENCOUNTER — Encounter: Payer: Self-pay | Admitting: Adult Health

## 2019-09-25 ENCOUNTER — Ambulatory Visit: Payer: 59 | Admitting: Adult Health

## 2019-09-25 ENCOUNTER — Other Ambulatory Visit: Payer: Self-pay

## 2019-09-25 VITALS — BP 114/81 | HR 68 | Ht 67.0 in | Wt 311.0 lb

## 2019-09-25 DIAGNOSIS — Z9989 Dependence on other enabling machines and devices: Secondary | ICD-10-CM

## 2019-09-25 DIAGNOSIS — G4733 Obstructive sleep apnea (adult) (pediatric): Secondary | ICD-10-CM | POA: Diagnosis not present

## 2019-09-25 NOTE — Progress Notes (Signed)
Chief Complaint:   OBESITY Janice Wolf is here to discuss her progress with her obesity treatment plan along with follow-up of her obesity related diagnoses. Janice Wolf is on the Category 3 Plan and states she is following her eating plan approximately 70% of the time. Janice Wolf states she is walking 2 miles 3 times per week.  Today's visit was #: 18 Starting weight: 304 lbs Starting date: 07/14/2018 Today's weight: 308 lbs Today's date: 09/21/2019 Total lbs lost to date: 0 Total lbs lost since last in-office visit: 2  Interim History: Janice Wolf voices she has been working and dealing with stress with her daughter getting her license and has been more on edge recently. She reports she has been walking more. She states she stuck to the meal plan 70%, but has tried to work on increasing food. Her work schedule is changing frequently secondary to people quitting and changing departments. She is going to [place] in the upcoming weeks.  Subjective:   Vitamin D deficiency. No nausea, vomiting, or muscle weakness. Janice Wolf endorses fatigue. She is on prescription Vitamin D. Last Vitamin D 28.3 on 07/18/2019.  Anxiety and depression. Janice Wolf reports her symptoms have increased with her decreased dose of sertraline. She denies suicidal or homicidal ideations.  At risk for osteoporosis. Janice Wolf is at higher risk of osteopenia and osteoporosis due to Vitamin D deficiency.   Assessment/Plan:   Vitamin D deficiency. Low Vitamin D level contributes to fatigue and are associated with obesity, breast, and colon cancer. She was given a refill on her Vitamin D, Ergocalciferol, (DRISDOL) 1.25 MG (50000 UNIT) CAPS capsule every week #4 with 0 refills and will follow-up for routine testing of Vitamin D, at least 2-3 times per year to avoid over-replacement.   Anxiety and depression. Janice Wolf will increase her sertraline (ZOLOFT) to 100 MG tablet PO daily #30 with 0 refills.  At risk for  osteoporosis. Janice Wolf was given approximately 15 minutes of osteoporosis prevention counseling today. Janice Wolf is at risk for osteopenia and osteoporosis due to her Vitamin D deficiency. She was encouraged to take her Vitamin D and follow her higher calcium diet and increase strengthening exercise to help strengthen her bones and decrease her risk of osteopenia and osteoporosis.  Repetitive spaced learning was employed today to elicit superior memory formation and behavioral change.  Class 3 severe obesity with serious comorbidity and body mass index (BMI) of 45.0 to 49.9 in adult, unspecified obesity type (Janice Wolf).  Janice Wolf is currently in the action stage of change. As such, her goal is to continue with weight loss efforts. She has agreed to the Category 3 Plan.   Exercise goals: For substantial health benefits, adults should do at least 150 minutes (2 hours and 30 minutes) a week of moderate-intensity, or 75 minutes (1 hour and 15 minutes) a week of vigorous-intensity aerobic physical activity, or an equivalent combination of moderate- and vigorous-intensity aerobic activity. Aerobic activity should be performed in episodes of at least 10 minutes, and preferably, it should be spread throughout the week.  Behavioral modification strategies: increasing lean protein intake, increasing vegetables, meal planning and cooking strategies, keeping healthy foods in the home and planning for success.  Janice Wolf has agreed to follow-up with our clinic in 5-6 weeks. She was informed of the importance of frequent follow-up visits to maximize her success with intensive lifestyle modifications for her multiple health conditions.   Objective:   Blood pressure 120/84, pulse 75, temperature (!) 97.5 F (36.4 C), temperature source Oral,  height 5\' 7"  (1.702 m), weight (!) 308 lb (139.7 kg), SpO2 97 %. Body mass index is 48.24 kg/m.  General: Cooperative, alert, well developed, in no acute distress. HEENT:  Conjunctivae and lids unremarkable. Cardiovascular: Regular rhythm.  Lungs: Normal work of breathing. Neurologic: No focal deficits.   No results found for: CREATININE, BUN, NA, K, CL, CO2 No results found for: ALT, AST, GGT, ALKPHOS, BILITOT Lab Results  Component Value Date   HGBA1C 5.4 07/18/2019   HGBA1C 5.4 10/25/2018   HGBA1C 5.6 07/14/2018   Lab Results  Component Value Date   INSULIN 29.1 (H) 07/18/2019   INSULIN 52.2 (H) 10/25/2018   INSULIN 35.5 (H) 07/14/2018   Lab Results  Component Value Date   TSH 3.180 07/18/2019   No results found for: CHOL, HDL, LDLCALC, LDLDIRECT, TRIG, CHOLHDL No results found for: WBC, HGB, HCT, MCV, PLT No results found for: IRON, TIBC, FERRITIN  Attestation Statements:   Reviewed by clinician on day of visit: allergies, medications, problem list, medical history, surgical history, family history, social history, and previous encounter notes.  I, Michaelene Song, am acting as transcriptionist for Coralie Common, MD   I have reviewed the above documentation for accuracy and completeness, and I agree with the above. - Jinny Blossom, MD

## 2019-09-25 NOTE — Progress Notes (Addendum)
[facility] [address] [place]. [place] [address] [phone]       OFFICE FOLLOW UP NOTE  [name] Date of Birth:  [birth year] Medical Record Number:  [id number]   Reason for visit: Initial CPAP compliance visit   SUBJECTIVE:   CHIEF COMPLAINT:  Chief Complaint  Patient presents with  . Follow-up    315 ready on tx rm. Pt said her cpap is not reading properly    HPI:   Janice Wolf is a 39 year old female with underlying medical history of vitamin D deficiency, migraine headaches, IBS, hypothyroidism, low back pain and morbid obesity evaluated by Dr. Rexene Wolf on 05/09/2019 for snoring and excessive daytime somnolence with possible underlying sleep apnea.  Split-night 06/24/2019 showed overall mild OSA more pronounced in rem sleep with total AHI of 7.7/h, REM AHI of 30.9/h and O2 nadir of 86%.  Recommend initiating AutoPap for treatment.  Review of compliance report shows 29 out of 30 usage days with 23 days greater than 4 hours for 77% compliance.  Average usage 6 hours and 42 minutes with residual AHI 1.9.  Leaks in the 95th percentile 3.1 L/min.  Pressure in the 95th percentile 9.8 cm H2O with min pressure 6 and max pressure 12 with a EPR level 3.  She reports having difficulty tolerating due to straps sliding otherwise tolerating okay Continues to feel fatigue which she is concerned about but continues to work rotating shifts Approximately 10 pound weight gain since prior visit     ROS:   14 system review of systems performed and negative with exception of fatigue   Epworth Sleepiness Scale How likely are you to doze in the following situations:  0 = not likely, 1 = slight chance, 2 = moderate chance, 3 = high chance   Sitting and Reading? 2 Watching Television? 2 Sitting inactive in a public place (theater or meeting)? 1 Lying down in the afternoon when circumstances permit? 3 Sitting and talking to someone? 0 Sitting quietly after  lunch without alcohol? 3 In a car, while stopped for a few minutes in traffic? 1 As a passenger in a car for an hour without a break? 3  Total = 15 (prior 19)     PMH:  Past Medical History:  Diagnosis Date  . Abnormal thyroid blood test    no treatment yet, per pt.  . Anxiety   . Back pain   . Complication of anesthesia    was hard to wake up after T & A 12/2015; had to stay overnight  . Dental crowns present   . Depression   . Dizziness   . Fatty liver   . Gallbladder problem   . GERD (gastroesophageal reflux disease)   . Hypothyroidism   . IBS (irritable bowel syndrome)   . Migraines    migraines  . Painful orthopaedic hardware ([place]) 09/2016   left foot  . PONV (postoperative nausea and vomiting)   . Stuffy nose 09/03/2016  . Vitamin D deficiency     PSH:  Past Surgical History:  Procedure Laterality Date  . CHOLECYSTECTOMY     age 55  . HARDWARE REMOVAL Left 10/01/2016   Procedure: HARDWARE REMOVAL left foot;  Surgeon: [name], MD;  Location: [facility];  Service: Orthopedics;  Laterality: Left;  . TARSAL METATARSAL ARTHRODESIS Left 02/20/2016   Procedure: FIRST AND SECOND TARSAL METATARSAL ARTHRODESIS;  Surgeon: [name], MD;  Location: [facility];  Service: Orthopedics;  Laterality:  Left;  . TONSILLECTOMY AND ADENOIDECTOMY  12/2015    Social History:  Social History   Socioeconomic History  . Marital status: Married    Spouse name: Janice Wolf  . Number of children: 2  . Years of education: Not on file  . Highest education level: Not on file  Occupational History  . Occupation: [employer]  . Smoking status: Never Smoker  . Smokeless tobacco: Never Used  Substance and Sexual Activity  . Alcohol use: No  . Drug use: No  . Sexual activity: Yes    Birth control/protection: I.U.D.  Other Topics Concern  . Not on file  Social History Narrative  . Not on file   Social Determinants of Health   Financial  Resource Strain:   . Difficulty of Paying Living Expenses:   Food Insecurity:   . Worried About Charity fundraiser in the Last Year:   . Arboriculturist in the Last Year:   Transportation Needs:   . Film/video editor (Medical):   [name] Lack of Transportation (Non-Medical):   Physical Activity:   . Days of Exercise per Week:   . Minutes of Exercise per Session:   Stress:   . Feeling of Stress :   Social Connections:   . Frequency of Communication with Friends and Family:   . Frequency of Social Gatherings with Friends and Family:   . Attends Religious Services:   . Active Member of Clubs or Organizations:   . Attends Archivist Meetings:   [name] Marital Status:   Intimate Partner Violence:   . Fear of Current or Ex-Partner:   . Emotionally Abused:   [name] Physically Abused:   . Sexually Abused:     Family History:  Family History  Problem Relation Age of Onset  . Diabetes Mother   . Hypertension Mother   . Anxiety disorder Mother   . Obesity Father   . Hyperlipidemia Father   . Hypertension Father   . Diabetes Father     Medications:   Current Outpatient Medications on File Prior to Visit  Medication Sig Dispense Refill  . diclofenac Sodium (VOLTAREN) 1 % GEL SMARTSIG:2 Gram(s) Topical 4 Times Daily    . esomeprazole (NEXIUM) 20 MG capsule Take 20 mg by mouth daily at 12 noon.    . gabapentin (NEURONTIN) 100 MG capsule Take 100 mg by mouth 3 (three) times a week. Only when working    . ibuprofen (ADVIL) 200 MG tablet Take by mouth.    . levonorgestrel (MIRENA) 20 MCG/24HR IUD 1 each by Intrauterine route once.    [name] levothyroxine (SYNTHROID) 25 MCG tablet Take 2 tablets (50 mcg total) by mouth daily before breakfast. 30 tablet 0  . lidocaine (XYLOCAINE) 5 % ointment APPLY TO AFFECTED AREA 1 4 TIMES DAILY AS NEEDED    . Multiple Vitamins-Minerals (MULTIVITAMIN WITH MINERALS) tablet Take 1 tablet by mouth daily.    . sertraline (ZOLOFT) 100 MG tablet Take 1 tablet  (100 mg total) by mouth daily. 30 tablet 0  . Vitamin D, Ergocalciferol, (DRISDOL) 1.25 MG (50000 UNIT) CAPS capsule Take 1 capsule (50,000 Units total) by mouth every 7 (seven) days. 4 capsule 0   No current facility-administered medications on file prior to visit.    Allergies:   Allergies  Allergen Reactions  . Aspirin Nausea And Vomiting  . Latex Other (See Comments)    SKIN IRRITATION  . Other Rash    PERFUMES  OBJECTIVE:  Physical Exam  Vitals:   09/25/19 1452  BP: 114/81  Pulse: 68  Weight: (!) 311 lb (141.1 kg)  Height: 5\' 7"  (1.702 m)   Body mass index is 48.71 kg/m. No exam data present  General: Morbidly obese pleasant middle-age Caucasian female, seated, in no evident distress Head: head normocephalic and atraumatic.   Neck: Circumference 18 inches Cardiovascular: regular rate and rhythm, no murmurs Musculoskeletal: no deformity Skin:  no rash/petichiae Vascular:  Normal pulses all extremities   Neurologic Exam Mental Status: Awake and fully alert.   Fluent speech and language.  Oriented to place and time. Recent and remote memory intact. Attention span, concentration and fund of knowledge appropriate. Mood and affect appropriate.  Cranial Nerves: Pupils equal, briskly reactive to light. Extraocular movements full without nystagmus. Visual fields full to confrontation. Hearing intact. Facial sensation intact. Face, tongue, palate moves normally and symmetrically.  Motor: Normal bulk and tone. Normal strength in all tested extremity muscles. Sensory.: intact to touch , pinprick , position and vibratory sensation.  Coordination: Rapid alternating movements normal in all extremities. Finger-to-nose and heel-to-shin performed accurately bilaterally. Gait and Station: Arises from chair without difficulty. Stance is normal. Gait demonstrates normal stride length and balance Reflexes: 1+ and symmetric. Toes downgoing.        ASSESSMENT/PLAN: Janice Wolf is a 39 y.o. year old female with recent diagnosis of sleep apnea initiated on CPAP in 07/2019  Compliance report shows optimal usage with satisfactory residual AHI 1.9.  We will continue current settings.  Order placed for mask refitting or possible need of strap adjustment.  Ensured her that it may take 3 to 6 months to see benefit from routine use of CPAP in regards to ongoing fatigue.  She will continue to follow with DME company for any needed supplies or CPAP concerns.  Discussion regarding importance of weight loss.    Follow up in 6 months or call earlier if needed   I spent 22 minutes of face-to-face and non-face-to-face time with patient.  This included previsit chart review, lab review, study review, order entry, electronic health record documentation, patient education     Frann Rider, [facility]  [facility] [address] [place], [place] [address]  Phone [phone] Fax [phone] Note: This document was prepared with digital dictation and possible smart phrase technology. Any transcriptional errors that result from this process are unintentional.   I reviewed the above note and documentation by the Nurse Practitioner and agree with the history, exam, assessment and plan as outlined above. I was available for consultation. Star Age, MD, PhD [facility])

## 2019-09-25 NOTE — Patient Instructions (Addendum)
Your Plan:  Continue ongoing use of CPAP for management of sleep apnea    Follow up in 6 months or call earlier if needed     Thank you for coming to see [place] at [facility]. I hope we have been able to provide you high quality care today.  You may receive a patient satisfaction survey over the next few weeks. We would appreciate your feedback and comments so that we may continue to improve ourselves and the health of our patients.

## 2019-09-26 NOTE — Progress Notes (Signed)
Order for mask refitting and cpap supplies sent to [employer] via community msg. Confirmation received that the order transmitted was successful.

## 2019-09-27 ENCOUNTER — Ambulatory Visit: Payer: 59 | Admitting: Neurology

## 2019-10-26 ENCOUNTER — Other Ambulatory Visit (INDEPENDENT_AMBULATORY_CARE_PROVIDER_SITE_OTHER): Payer: Self-pay | Admitting: Family Medicine

## 2019-10-26 ENCOUNTER — Encounter (INDEPENDENT_AMBULATORY_CARE_PROVIDER_SITE_OTHER): Payer: Self-pay

## 2019-10-26 DIAGNOSIS — F419 Anxiety disorder, unspecified: Secondary | ICD-10-CM

## 2019-10-26 DIAGNOSIS — F32A Depression, unspecified: Secondary | ICD-10-CM

## 2019-10-31 ENCOUNTER — Encounter (INDEPENDENT_AMBULATORY_CARE_PROVIDER_SITE_OTHER): Payer: Self-pay

## 2019-10-31 ENCOUNTER — Other Ambulatory Visit (INDEPENDENT_AMBULATORY_CARE_PROVIDER_SITE_OTHER): Payer: Self-pay | Admitting: Family Medicine

## 2019-10-31 DIAGNOSIS — E559 Vitamin D deficiency, unspecified: Secondary | ICD-10-CM

## 2019-11-02 ENCOUNTER — Ambulatory Visit (INDEPENDENT_AMBULATORY_CARE_PROVIDER_SITE_OTHER): Payer: 59 | Admitting: Family Medicine

## 2019-11-16 ENCOUNTER — Ambulatory Visit (INDEPENDENT_AMBULATORY_CARE_PROVIDER_SITE_OTHER): Payer: 59 | Admitting: Family Medicine

## 2019-11-21 ENCOUNTER — Other Ambulatory Visit (INDEPENDENT_AMBULATORY_CARE_PROVIDER_SITE_OTHER): Payer: Self-pay | Admitting: Family Medicine

## 2019-11-21 DIAGNOSIS — F32A Depression, unspecified: Secondary | ICD-10-CM

## 2019-11-21 DIAGNOSIS — F419 Anxiety disorder, unspecified: Secondary | ICD-10-CM

## 2019-11-27 ENCOUNTER — Encounter (INDEPENDENT_AMBULATORY_CARE_PROVIDER_SITE_OTHER): Payer: Self-pay | Admitting: Family Medicine

## 2019-11-27 NOTE — Telephone Encounter (Signed)
Please review and advise. Last seen 5/21, due back 5-6 weeks. No appts currently scheduled.

## 2019-12-13 ENCOUNTER — Ambulatory Visit (INDEPENDENT_AMBULATORY_CARE_PROVIDER_SITE_OTHER): Payer: 59 | Admitting: Family Medicine

## 2019-12-13 ENCOUNTER — Encounter (INDEPENDENT_AMBULATORY_CARE_PROVIDER_SITE_OTHER): Payer: Self-pay | Admitting: Family Medicine

## 2019-12-13 ENCOUNTER — Other Ambulatory Visit: Payer: Self-pay

## 2019-12-13 VITALS — BP 104/73 | HR 80 | Temp 98.1°F | Ht 67.0 in | Wt 306.0 lb

## 2019-12-13 DIAGNOSIS — E559 Vitamin D deficiency, unspecified: Secondary | ICD-10-CM | POA: Diagnosis not present

## 2019-12-13 DIAGNOSIS — F419 Anxiety disorder, unspecified: Secondary | ICD-10-CM

## 2019-12-13 DIAGNOSIS — Z9189 Other specified personal risk factors, not elsewhere classified: Secondary | ICD-10-CM

## 2019-12-13 DIAGNOSIS — Z6841 Body Mass Index (BMI) 40.0 and over, adult: Secondary | ICD-10-CM

## 2019-12-13 DIAGNOSIS — F32A Depression, unspecified: Secondary | ICD-10-CM

## 2019-12-13 DIAGNOSIS — F329 Major depressive disorder, single episode, unspecified: Secondary | ICD-10-CM

## 2019-12-13 DIAGNOSIS — E66813 Obesity, class 3: Secondary | ICD-10-CM

## 2019-12-13 MED ORDER — VITAMIN D (ERGOCALCIFEROL) 1.25 MG (50000 UNIT) PO CAPS: 50000.0000 [IU] | ORAL_CAPSULE | ORAL | 0 refills | Status: DC

## 2019-12-13 MED ORDER — WEGOVY 0.25 MG/0.5ML ~~LOC~~ SOAJ: 0.2500 mg | SUBCUTANEOUS | 0 refills | Status: DC

## 2019-12-13 MED ORDER — SERTRALINE HCL 100 MG PO TABS: 100.0000 mg | ORAL_TABLET | Freq: Every day | ORAL | 0 refills | Status: DC

## 2019-12-13 MED ORDER — HYDROXYZINE HCL 25 MG PO TABS: 25.0000 mg | ORAL_TABLET | Freq: Every day | ORAL | 0 refills | Status: DC

## 2019-12-14 NOTE — Progress Notes (Signed)
Chief Complaint:   OBESITY Janice Wolf is here to discuss her progress with her obesity treatment plan along with follow-up of her obesity related diagnoses. Janice Wolf is on the Category 3 Plan and states she is following her eating plan approximately 40% of the time. Janice Wolf states she is active while at work.  Today's visit was #: 28 Starting weight: 304 lbs Starting date: 07/14/2018 Today's weight: 306 lbs Today's date: 12/13/2019 Total lbs lost to date: 0 Total lbs lost since last in-office visit: 2  Interim History: Janice Wolf voices that she has been helping a friend who was recently diagnosed with breast cancer and working. She notes work is fairly stressful. 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 She has also stopped eating red meat.  Subjective:   1. Vitamin D deficiency Janice Wolf denies nausea, vomiting, or muscle weakness, but she notes fatigue. Last Vit D level was 28.3.  2. Anxiety and depression Janice Wolf denies suicidal ideas or homicidal ideas. She is on sertraline 100 mg daily. Her symptoms are somewhat controlled but she has had increased personal stress.  3. At risk for osteoporosis Janice Wolf is at higher risk of osteopenia and osteoporosis due to Vitamin D deficiency.   Assessment/Plan:   1. Vitamin D deficiency Low Vitamin D level contributes to fatigue and are associated with obesity, breast, and colon cancer. We will refill prescription Vitamin D for 1 month. Janice Wolf will follow-up for routine testing of Vitamin D, at least 2-3 times per year to avoid over-replacement.  - Vitamin D, Ergocalciferol, (DRISDOL) 1.25 MG (50000 UNIT) CAPS capsule; Take 1 capsule (50,000 Units total) by mouth every 7 (seven) days.  Dispense: 4 capsule; Refill: 0  2. Anxiety and depression Behavior modification techniques were discussed today to help Janice Wolf deal with her anxiety and depression. We will refill sertraline for 1  month. Orders and follow up as documented in patient record.   - sertraline (ZOLOFT) 100 MG tablet; Take 1 tablet (100 mg total) by mouth daily.  Dispense: 30 tablet; Refill: 0  3. At risk for osteoporosis Janice Wolf was given approximately 15 minutes of osteoporosis prevention counseling today. Janice Wolf is at risk for osteopenia and osteoporosis due to her Vitamin D deficiency. She was encouraged to take her Vitamin D and follow her higher calcium diet and increase strengthening exercise to help strengthen her bones and decrease her risk of osteopenia and osteoporosis.  Repetitive spaced learning was employed today to elicit superior memory formation and behavioral change.  4. Class 3 severe obesity with serious comorbidity and body mass index (BMI) of 45.0 to 49.9 in adult, unspecified obesity type [facility]) Janice Wolf is currently in the action stage of change. As such, her goal is to continue with weight loss efforts. She has agreed to the [employer] + 300 calories.   We discussed various medication options to help Janice Wolf with her weight loss efforts and we both agreed to start Wegovy 0.25 mg SubQ weekly with no refills.  - Semaglutide-Weight Management (WEGOVY) 0.25 MG/0.5ML SOAJ; Inject 0.25 mg into the skin once a week.  Dispense: 2 mL; Refill: 0  Exercise goals: All adults should avoid inactivity. Some physical activity is better than none, and adults who participate in any amount of physical activity gain some health benefits.  Behavioral modification strategies: increasing lean protein intake, meal planning and cooking strategies, keeping healthy foods in the home and planning for success.  Janice Wolf has agreed to follow-up with  our clinic in 3 to 4 weeks. She was informed of the importance of frequent follow-up visits to maximize her success with intensive lifestyle modifications for her multiple health conditions.   Objective:   Blood pressure 104/73, pulse 80, temperature 98.1 F  (36.7 C), temperature source Oral, height 5\' 7"  (1.702 m), weight (!) 306 lb (138.8 kg), SpO2 96 %. Body mass index is 47.93 kg/m.  General: Cooperative, alert, well developed, in no acute distress. HEENT: Conjunctivae and lids unremarkable. Cardiovascular: Regular rhythm.  Lungs: Normal work of breathing. Neurologic: No focal deficits.   No results found for: CREATININE, BUN, NA, K, CL, CO2 No results found for: ALT, AST, GGT, ALKPHOS, BILITOT Lab Results  Component Value Date   HGBA1C 5.4 07/18/2019   HGBA1C 5.4 10/25/2018   HGBA1C 5.6 07/14/2018   Lab Results  Component Value Date   INSULIN 29.1 (H) 07/18/2019   INSULIN 52.2 (H) 10/25/2018   INSULIN 35.5 (H) 07/14/2018   Lab Results  Component Value Date   TSH 3.180 07/18/2019   No results found for: CHOL, HDL, LDLCALC, LDLDIRECT, TRIG, CHOLHDL No results found for: WBC, HGB, HCT, MCV, PLT No results found for: IRON, TIBC, FERRITIN  Attestation Statements:   Reviewed by clinician on day of visit: allergies, medications, problem list, medical history, surgical history, family history, social history, and previous encounter notes.   I, Trixie Dredge, am acting as transcriptionist for Coralie Common, MD.  I have reviewed the above documentation for accuracy and completeness, and I agree with the above. - Jinny Blossom, MD

## 2020-01-03 ENCOUNTER — Other Ambulatory Visit (INDEPENDENT_AMBULATORY_CARE_PROVIDER_SITE_OTHER): Payer: Self-pay | Admitting: Family Medicine

## 2020-01-03 DIAGNOSIS — E559 Vitamin D deficiency, unspecified: Secondary | ICD-10-CM

## 2020-01-05 ENCOUNTER — Other Ambulatory Visit (INDEPENDENT_AMBULATORY_CARE_PROVIDER_SITE_OTHER): Payer: Self-pay | Admitting: Family Medicine

## 2020-01-05 DIAGNOSIS — F32A Depression, unspecified: Secondary | ICD-10-CM

## 2020-01-05 DIAGNOSIS — F419 Anxiety disorder, unspecified: Secondary | ICD-10-CM

## 2020-01-11 ENCOUNTER — Encounter (INDEPENDENT_AMBULATORY_CARE_PROVIDER_SITE_OTHER): Payer: Self-pay | Admitting: Family Medicine

## 2020-01-11 ENCOUNTER — Other Ambulatory Visit: Payer: Self-pay

## 2020-01-11 ENCOUNTER — Ambulatory Visit (INDEPENDENT_AMBULATORY_CARE_PROVIDER_SITE_OTHER): Payer: 59 | Admitting: Family Medicine

## 2020-01-11 VITALS — BP 103/71 | HR 85 | Temp 98.0°F | Ht 67.0 in | Wt 310.0 lb

## 2020-01-11 DIAGNOSIS — G43919 Migraine, unspecified, intractable, without status migrainosus: Secondary | ICD-10-CM

## 2020-01-11 DIAGNOSIS — F419 Anxiety disorder, unspecified: Secondary | ICD-10-CM | POA: Diagnosis not present

## 2020-01-11 DIAGNOSIS — Z9189 Other specified personal risk factors, not elsewhere classified: Secondary | ICD-10-CM

## 2020-01-11 DIAGNOSIS — Z6841 Body Mass Index (BMI) 40.0 and over, adult: Secondary | ICD-10-CM

## 2020-01-11 DIAGNOSIS — E559 Vitamin D deficiency, unspecified: Secondary | ICD-10-CM | POA: Diagnosis not present

## 2020-01-11 DIAGNOSIS — F329 Major depressive disorder, single episode, unspecified: Secondary | ICD-10-CM

## 2020-01-11 DIAGNOSIS — F32A Depression, unspecified: Secondary | ICD-10-CM

## 2020-01-11 MED ORDER — ESCITALOPRAM OXALATE 10 MG PO TABS: 10.0000 mg | ORAL_TABLET | Freq: Every day | ORAL | 0 refills | Status: DC

## 2020-01-11 MED ORDER — VITAMIN D (ERGOCALCIFEROL) 1.25 MG (50000 UNIT) PO CAPS: 50000.0000 [IU] | ORAL_CAPSULE | ORAL | 0 refills | Status: DC

## 2020-01-12 NOTE — Progress Notes (Signed)
Chief Complaint:   OBESITY Kayah is here to discuss her progress with her obesity treatment plan along with follow-up of her obesity related diagnoses. Shawnette is on the [employer] + 300 calories and states she is following her eating plan approximately 20% of the time. Kaytelynn states she is doing 0 minutes 0 times per week.  Today's visit was #: 20 Starting weight: 304 lbs Starting date: 07/14/2018 Today's weight: 310 lbs Today's date: 01/11/2020 Total lbs lost to date: 0 Total lbs lost since last in-office visit: 0  Interim History: Yanelis's migraines have been very substantial, requiring her to be taken out of work. She cant take the pills when she is drowsy. She hasn't started [facility] yet, and she is still not eating beef or red meat. She is still not eating as much of food as she should be. She is sometimes only eating 1 time per day. She is applying for a new positions at work which she would be working day shift only.  Subjective:   1. Anxiety and depression Davon denies suicidal or homicidal ideas on sertraline 100 mg. She is not seeing much of a difference with current dose.   2. Vitamin D deficiency Zniyah denies nausea, vomiting, or muscle weakness, but she notes fatigue. She is on prescription Vit D.  3. Migraine, unspecified migraine type Francesca is on Rizatriptan and amitriptyline (Trptan for rescue). She notes worsening symptoms recently.  4. At risk for depression Emilia is at elevated risk of depression due to change in medication.  Assessment/Plan:   1. Anxiety and depression Behavior modification techniques were discussed today to help Maraya deal with her anxiety and depression. Charise agreed to decrease Zoloft to 50 mg daily for 1 week then start Lexapro 10 mg daily with no refills. Orders and follow up as documented in patient record.   - escitalopram (LEXAPRO) 10 MG tablet; Take 1 tablet (10 mg total) by mouth daily.  Dispense: 30  tablet; Refill: 0  2. Vitamin D deficiency Low Vitamin D level contributes to fatigue and are associated with obesity, breast, and colon cancer. We will refill prescription Vitamin D for 1 month. Sinia will follow-up for routine testing of Vitamin D, at least 2-3 times per year to avoid over-replacement.  - Vitamin D, Ergocalciferol, (DRISDOL) 1.25 MG (50000 UNIT) CAPS capsule; Take 1 capsule (50,000 Units total) by mouth every 7 (seven) days.  Dispense: 4 capsule; Refill: 0  3. Migraine, unspecified migraine type Lekeshia will continue to follow up with Dr. Stephanie Acre.  4. At risk for depression Tamieka was given approximately 15 minutes of depression risk counseling today. She has risk factors for depression including anxiety. We discussed the importance of a healthy work life balance, a healthy relationship with food and a good support system.  Repetitive spaced learning was employed today to elicit superior memory formation and behavioral change.  5. Class 3 severe obesity with serious comorbidity and body mass index (BMI) of 45.0 to 49.9 in adult, unspecified obesity type [facility]) Deshea is currently in the action stage of change. As such, her goal is to continue with weight loss efforts. She has agreed to the Category 3 Plan or the [place] + 300 calories.   Exercise goals: No exercise has been prescribed at this time.  Behavioral modification strategies: increasing lean protein intake, meal planning and cooking strategies, keeping healthy foods in the home and planning for success.  Calvina has agreed to follow-up with our clinic in 4  weeks. She was informed of the importance of frequent follow-up visits to maximize her success with intensive lifestyle modifications for her multiple health conditions.   Objective:   Blood pressure 103/71, pulse 85, temperature 98 F (36.7 C), height 5\' 7"  (1.702 m), weight (!) 310 lb (140.6 kg), SpO2 97 %. Body mass index is 48.55  kg/m.  General: Cooperative, alert, well developed, in no acute distress. HEENT: Conjunctivae and lids unremarkable. Cardiovascular: Regular rhythm.  Lungs: Normal work of breathing. Neurologic: No focal deficits.   No results found for: CREATININE, BUN, NA, K, CL, CO2 No results found for: ALT, AST, GGT, ALKPHOS, BILITOT Lab Results  Component Value Date   HGBA1C 5.4 07/18/2019   HGBA1C 5.4 10/25/2018   HGBA1C 5.6 07/14/2018   Lab Results  Component Value Date   INSULIN 29.1 (H) 07/18/2019   INSULIN 52.2 (H) 10/25/2018   INSULIN 35.5 (H) 07/14/2018   Lab Results  Component Value Date   TSH 3.180 07/18/2019   No results found for: CHOL, HDL, LDLCALC, LDLDIRECT, TRIG, CHOLHDL No results found for: WBC, HGB, HCT, MCV, PLT No results found for: IRON, TIBC, FERRITIN  Attestation Statements:   Reviewed by clinician on day of visit: allergies, medications, problem list, medical history, surgical history, family history, social history, and previous encounter notes.   I, Trixie Dredge, am acting as transcriptionist for Coralie Common, MD. I have reviewed the above documentation for accuracy and completeness, and I agree with the above. - Jinny Blossom, MD

## 2020-01-31 ENCOUNTER — Other Ambulatory Visit (INDEPENDENT_AMBULATORY_CARE_PROVIDER_SITE_OTHER): Payer: Self-pay | Admitting: Family Medicine

## 2020-01-31 DIAGNOSIS — E559 Vitamin D deficiency, unspecified: Secondary | ICD-10-CM

## 2020-02-02 ENCOUNTER — Other Ambulatory Visit (INDEPENDENT_AMBULATORY_CARE_PROVIDER_SITE_OTHER): Payer: Self-pay | Admitting: Family Medicine

## 2020-02-02 DIAGNOSIS — F32A Depression, unspecified: Secondary | ICD-10-CM

## 2020-02-02 DIAGNOSIS — F419 Anxiety disorder, unspecified: Secondary | ICD-10-CM

## 2020-02-08 ENCOUNTER — Other Ambulatory Visit: Payer: Self-pay

## 2020-02-08 ENCOUNTER — Ambulatory Visit (INDEPENDENT_AMBULATORY_CARE_PROVIDER_SITE_OTHER): Payer: 59 | Admitting: Family Medicine

## 2020-02-08 ENCOUNTER — Encounter (INDEPENDENT_AMBULATORY_CARE_PROVIDER_SITE_OTHER): Payer: Self-pay | Admitting: Family Medicine

## 2020-02-08 VITALS — BP 92/67 | HR 91 | Temp 98.1°F | Ht 67.0 in | Wt 305.0 lb

## 2020-02-08 DIAGNOSIS — E559 Vitamin D deficiency, unspecified: Secondary | ICD-10-CM | POA: Diagnosis not present

## 2020-02-08 DIAGNOSIS — Z6841 Body Mass Index (BMI) 40.0 and over, adult: Secondary | ICD-10-CM

## 2020-02-08 DIAGNOSIS — Z9189 Other specified personal risk factors, not elsewhere classified: Secondary | ICD-10-CM | POA: Diagnosis not present

## 2020-02-08 DIAGNOSIS — F419 Anxiety disorder, unspecified: Secondary | ICD-10-CM

## 2020-02-08 DIAGNOSIS — F32A Depression, unspecified: Secondary | ICD-10-CM | POA: Diagnosis not present

## 2020-02-08 MED ORDER — VITAMIN D (ERGOCALCIFEROL) 1.25 MG (50000 UNIT) PO CAPS: 50000.0000 [IU] | ORAL_CAPSULE | ORAL | 0 refills | Status: DC

## 2020-02-08 MED ORDER — WEGOVY 0.5 MG/0.5ML ~~LOC~~ SOAJ: 0.5000 mg | SUBCUTANEOUS | 0 refills | Status: DC

## 2020-02-08 MED ORDER — ESCITALOPRAM OXALATE 20 MG PO TABS: 20.0000 mg | ORAL_TABLET | Freq: Every day | ORAL | 0 refills | Status: DC

## 2020-02-12 NOTE — Progress Notes (Signed)
Chief Complaint:   OBESITY Janice Wolf is here to discuss her progress with her obesity treatment plan along with follow-up of her obesity related diagnoses. Janice Wolf is on the Category 3 Plan or the [place] + 300 calories and states she is following her eating plan approximately 65-70% of the time. Janice Wolf states she is walking at work and on the treadmill 10,000-15,000 steps 4-7 times per week.  Today's visit was #: 21 Starting weight: 304 lbs Starting date: 07/14/2018 Today's weight: 305 lbs Today's date: 02/08/2020 Total lbs lost to date: 0 Total lbs lost since last in-office visit: 5  Interim History: Janice Wolf is enjoying the Pescat[employer] plan so far. She voices she was recently gifted a Administrator, arts. She denies hunger. She is eating half of the food on her plate but eating more frequently throughout the day. She is doing chips, sugar free pudding, and peanuts as snacks.  Subjective:   1. Vitamin D deficiency Janice Wolf denies nausea, vomiting, or muscle weakness, but notes fatigue. She is on prescription Vit D. Last Vit D level was 28.3.  2. Anxiety and depression Janice Wolf denies suicidal or homicidal ideas. She is on Lexapro daily.  3. At risk for deficient intake of food The patient is at a higher than average risk of deficient intake of food due to flipping work schedule between day and night.  Assessment/Plan:   1. Vitamin D deficiency Low Vitamin D level contributes to fatigue and are associated with obesity, breast, and colon cancer. We will refill prescription Vitamin D for 1 month. Janice Wolf will follow-up for routine testing of Vitamin D, at least 2-3 times per year to avoid over-replacement.  - Vitamin D, Ergocalciferol, (DRISDOL) 1.25 MG (50000 UNIT) CAPS capsule; Take 1 capsule (50,000 Units total) by mouth every 7 (seven) days.  Dispense: 4 capsule; Refill: 0  2. Anxiety and depression Behavior modification techniques were discussed today to help  Janice Wolf deal with her anxiety and depression. We will refill Lexapro for 1 month. Orders and follow up as documented in patient record.   - escitalopram (LEXAPRO) 20 MG tablet; Take 1 tablet (20 mg total) by mouth daily.  Dispense: 30 tablet; Refill: 0  3. At risk for deficient intake of food Janice Wolf was given approximately 15 minutes of deficit intake of food prevention counseling today. Janice Wolf is at risk for eating too few calories based on current food recall. She was encouraged to focus on meeting caloric and protein goals according to her recommended meal plan.   4. Class 3 severe obesity with serious comorbidity and body mass index (BMI) of 45.0 to 49.9 in adult, unspecified obesity type Janice Wolf) Janice Wolf is currently in the action stage of change. As such, her goal is to continue with weight loss efforts. She has agreed to the [employer] + 300 calories.   We discussed various medication options to help Janice Wolf with her weight loss efforts and we both agreed to increase Wegovy to 0.50 mg SubQ weekly with no refill.  - Semaglutide-Weight Management (WEGOVY) 0.5 MG/0.5ML SOAJ; Inject 0.5 mg into the skin once a week.  Dispense: 2 mL; Refill: 0  Exercise goals: All adults should avoid inactivity. Some physical activity is better than none, and adults who participate in any amount of physical activity gain some health benefits.  Behavioral modification strategies: increasing lean protein intake, meal planning and cooking strategies, keeping healthy foods in the home and planning for success.  Janice Wolf has agreed to follow-up with our clinic in  4 weeks. She was informed of the importance of frequent follow-up visits to maximize her success with intensive lifestyle modifications for her multiple health conditions.   Objective:   Blood pressure 92/67, pulse 91, temperature 98.1 F (36.7 C), temperature source Oral, height 5\' 7"  (1.702 m), weight (!) 305 lb (138.3 kg), SpO2 97 %. Body  mass index is 47.77 kg/m.  General: Cooperative, alert, well developed, in no acute distress. HEENT: Conjunctivae and lids unremarkable. Cardiovascular: Regular rhythm.  Lungs: Normal work of breathing. Neurologic: No focal deficits.   No results found for: CREATININE, BUN, NA, K, CL, CO2 No results found for: ALT, AST, GGT, ALKPHOS, BILITOT Lab Results  Component Value Date   HGBA1C 5.4 07/18/2019   HGBA1C 5.4 10/25/2018   HGBA1C 5.6 07/14/2018   Lab Results  Component Value Date   INSULIN 29.1 (H) 07/18/2019   INSULIN 52.2 (H) 10/25/2018   INSULIN 35.5 (H) 07/14/2018   Lab Results  Component Value Date   TSH 3.180 07/18/2019   No results found for: CHOL, HDL, LDLCALC, LDLDIRECT, TRIG, CHOLHDL No results found for: WBC, HGB, HCT, MCV, PLT No results found for: IRON, TIBC, FERRITIN  Attestation Statements:   Reviewed by clinician on day of visit: allergies, medications, problem list, medical history, surgical history, family history, social history, and previous encounter notes.   I, Trixie Dredge, am acting as transcriptionist for Coralie Common, MD.  I have reviewed the above documentation for accuracy and completeness, and I agree with the above. - Jinny Blossom, MD

## 2020-03-01 ENCOUNTER — Other Ambulatory Visit (INDEPENDENT_AMBULATORY_CARE_PROVIDER_SITE_OTHER): Payer: Self-pay | Admitting: Family Medicine

## 2020-03-01 DIAGNOSIS — F32A Depression, unspecified: Secondary | ICD-10-CM

## 2020-03-04 NOTE — Telephone Encounter (Signed)
This patient was last seen by Dr. Jearld Shines, and currently has an upcoming appt scheduled on 03/11/20 with her.

## 2020-03-05 ENCOUNTER — Other Ambulatory Visit (INDEPENDENT_AMBULATORY_CARE_PROVIDER_SITE_OTHER): Payer: Self-pay | Admitting: Family Medicine

## 2020-03-05 DIAGNOSIS — F419 Anxiety disorder, unspecified: Secondary | ICD-10-CM

## 2020-03-05 DIAGNOSIS — E559 Vitamin D deficiency, unspecified: Secondary | ICD-10-CM

## 2020-03-05 DIAGNOSIS — F32A Depression, unspecified: Secondary | ICD-10-CM

## 2020-03-05 NOTE — Telephone Encounter (Signed)
Dr Jeani Sow pt

## 2020-03-06 MED ORDER — WEGOVY 0.5 MG/0.5ML ~~LOC~~ SOAJ: 0.5000 mg | SUBCUTANEOUS | 0 refills | Status: DC

## 2020-03-06 MED ORDER — ESCITALOPRAM OXALATE 20 MG PO TABS: 20.0000 mg | ORAL_TABLET | Freq: Every day | ORAL | 0 refills | Status: DC

## 2020-03-07 ENCOUNTER — Other Ambulatory Visit (INDEPENDENT_AMBULATORY_CARE_PROVIDER_SITE_OTHER): Payer: Self-pay

## 2020-03-07 ENCOUNTER — Ambulatory Visit (INDEPENDENT_AMBULATORY_CARE_PROVIDER_SITE_OTHER): Payer: 59 | Admitting: Family Medicine

## 2020-03-07 DIAGNOSIS — Z6841 Body Mass Index (BMI) 40.0 and over, adult: Secondary | ICD-10-CM

## 2020-03-07 MED ORDER — WEGOVY 0.5 MG/0.5ML ~~LOC~~ SOAJ: 0.5000 mg | SUBCUTANEOUS | 0 refills | Status: DC

## 2020-04-01 ENCOUNTER — Encounter: Payer: Self-pay | Admitting: Adult Health

## 2020-04-01 ENCOUNTER — Ambulatory Visit (INDEPENDENT_AMBULATORY_CARE_PROVIDER_SITE_OTHER): Payer: 59 | Admitting: Family Medicine

## 2020-04-01 ENCOUNTER — Other Ambulatory Visit: Payer: Self-pay

## 2020-04-01 ENCOUNTER — Ambulatory Visit: Payer: 59 | Admitting: Adult Health

## 2020-04-01 VITALS — BP 114/69 | HR 68 | Ht 67.0 in | Wt 309.0 lb

## 2020-04-01 DIAGNOSIS — G4733 Obstructive sleep apnea (adult) (pediatric): Secondary | ICD-10-CM

## 2020-04-01 DIAGNOSIS — Z9989 Dependence on other enabling machines and devices: Secondary | ICD-10-CM

## 2020-04-01 NOTE — Progress Notes (Addendum)
[facility] [address] [place]. [place] [address] [phone]       OFFICE FOLLOW UP NOTE  Ms. TASMINE HIPWELL Date of Birth:  1980-08-11 Medical Record Number:  [id number]   Reason for visit: Initial CPAP compliance visit   SUBJECTIVE:   CHIEF COMPLAINT:  Chief Complaint  Patient presents with  . Follow-up    tx rm  . Sleep Apnea    pt has no new concerns.     HPI:   Gustava Berland is a 39 year old female with underlying medical history of vitamin D deficiency, migraine headaches, IBS, hypothyroidism, low back pain and morbid obesity evaluated by Dr. Rexene Alberts on 05/09/2019 for snoring and excessive daytime somnolence with possible underlying sleep apnea.  Split-night 06/24/2019 showed overall mild OSA more pronounced in rem sleep with total AHI of 7.7/h, REM AHI of 30.9/h and O2 nadir of 86%.  Recommend initiating AutoPap for treatment.  Today, 04/01/2020, patient returns for 4-month CPAP follow-up visit.  She does report difficulty using machine consistently lately due to working 70+hrs/week with rotating day and night shifts and at times won't sleep when transitioning from night to day shift.  Fortunately, she has been hired for a new position which is Monday through Friday dayshift and is hopeful to start by the end of January.  Prior reports of difficulty tolerating mask but this has since improved with use of a different mask type.  She reports continued daytime fatigue and increased frequency of migraines but believes this is due to increased stressors, amount of hours she is working and inconsistent use of CPAP.  ESS 12/24 (prior 15/24) and FSS 54/63.  No further concerns at this time.  Review of compliance report from 03/02/2020 -03/31/2020 shows 22 out of 30 usage days for 73% compliance and 16 days greater than 4 hours per 53% usage.  Residual AHI 1.8 with min pressure 6 and max pressure 12 with a EPR level 3.  Pressure in the 95th percentile 8.7 and leaks  in the 95th percentile 6.8.      ROS:   14 system review of systems performed and negative with exception of fatigue and migraines    PMH:  Past Medical History:  Diagnosis Date  . Abnormal thyroid blood test    no treatment yet, per pt.  . Anxiety   . Back pain   . Complication of anesthesia    was hard to wake up after T & A 12/2015; had to stay overnight  . Dental crowns present   . Depression   . Dizziness   . Fatty liver   . Gallbladder problem   . GERD (gastroesophageal reflux disease)   . Hypothyroidism   . IBS (irritable bowel syndrome)   . Migraines    migraines  . Painful orthopaedic hardware ([place]) 09/2016   left foot  . PONV (postoperative nausea and vomiting)   . Stuffy nose 09/03/2016  . Vitamin D deficiency     PSH:  Past Surgical History:  Procedure Laterality Date  . CHOLECYSTECTOMY     age 72  . HARDWARE REMOVAL Left 10/01/2016   Procedure: HARDWARE REMOVAL left foot;  Surgeon: [name], MD;  Location: [facility];  Service: Orthopedics;  Laterality: Left;  . TARSAL METATARSAL ARTHRODESIS Left 02/20/2016   Procedure: FIRST AND SECOND TARSAL METATARSAL ARTHRODESIS;  Surgeon: [name], MD;  Location: [facility];  Service: Orthopedics;  Laterality: Left;  . TONSILLECTOMY AND ADENOIDECTOMY  12/2015  Social History:  Social History   Socioeconomic History  . Marital status: Married    Spouse name: Grayland Ormond  . Number of children: 2  . Years of education: Not on file  . Highest education level: Not on file  Occupational History  . Occupation: [employer]  . Smoking status: Never Smoker  . Smokeless tobacco: Never Used  Vaping Use  . Vaping Use: Never used  Substance and Sexual Activity  . Alcohol use: No  . Drug use: No  . Sexual activity: Yes    Birth control/protection: I.U.D.  Other Topics Concern  . Not on file  Social History Narrative  . Not on file   Social Determinants of Health    Financial Resource Strain:   . Difficulty of Paying Living Expenses: Not on file  Food Insecurity:   . Worried About Charity fundraiser in the Last Year: Not on file  . Ran Out of Food in the Last Year: Not on file  Transportation Needs:   . Lack of Transportation (Medical): Not on file  . Lack of Transportation (Non-Medical): Not on file  Physical Activity:   . Days of Exercise per Week: Not on file  . Minutes of Exercise per Session: Not on file  Stress:   . Feeling of Stress : Not on file  Social Connections:   . Frequency of Communication with Friends and Family: Not on file  . Frequency of Social Gatherings with Friends and Family: Not on file  . Attends Religious Services: Not on file  . Active Member of Clubs or Organizations: Not on file  . Attends Archivist Meetings: Not on file  . Marital Status: Not on file  Intimate Partner Violence:   . Fear of Current or Ex-Partner: Not on file  . Emotionally Abused: Not on file  . Physically Abused: Not on file  . Sexually Abused: Not on file    Family History:  Family History  Problem Relation Age of Onset  . Diabetes Mother   . Hypertension Mother   . Anxiety disorder Mother   . Obesity Father   . Hyperlipidemia Father   . Hypertension Father   . Diabetes Father     Medications:   Current Outpatient Medications on File Prior to Visit  Medication Sig Dispense Refill  . diclofenac Sodium (VOLTAREN) 1 % GEL SMARTSIG:2 Gram(s) Topical 4 Times Daily    . escitalopram (LEXAPRO) 20 MG tablet Take 1 tablet (20 mg total) by mouth daily. 30 tablet 0  . esomeprazole (NEXIUM) 20 MG capsule Take 20 mg by mouth daily at 12 noon.    . gabapentin (NEURONTIN) 100 MG capsule Take 100 mg by mouth 3 (three) times a week. Only when working    . hydrOXYzine (ATARAX/VISTARIL) 25 MG tablet Take 1 tablet (25 mg total) by mouth at bedtime. 30 tablet 0  . ibuprofen (ADVIL) 200 MG tablet Take by mouth.    . levonorgestrel (MIRENA)  20 MCG/24HR IUD 1 each by Intrauterine route once.    [name] levothyroxine (SYNTHROID) 25 MCG tablet Take 2 tablets (50 mcg total) by mouth daily before breakfast. 30 tablet 0  . lidocaine (XYLOCAINE) 5 % ointment APPLY TO AFFECTED AREA 1 4 TIMES DAILY AS NEEDED    . Multiple Vitamins-Minerals (MULTIVITAMIN WITH MINERALS) tablet Take 1 tablet by mouth daily.    . Semaglutide-Weight Management (WEGOVY) 0.5 MG/0.5ML SOAJ Inject 0.5 mg into the skin once a week. 2 mL 0  .  Vitamin D, Ergocalciferol, (DRISDOL) 1.25 MG (50000 UNIT) CAPS capsule Take 1 capsule (50,000 Units total) by mouth every 7 (seven) days. 4 capsule 0   No current facility-administered medications on file prior to visit.    Allergies:   Allergies  Allergen Reactions  . Aspirin Nausea And Vomiting  . Latex Other (See Comments)    SKIN IRRITATION  . Other Rash    PERFUMES      OBJECTIVE:  Physical Exam  Vitals:   04/01/20 1529  Weight: (!) 309 lb (140.2 kg)  Height: 5\' 7"  (1.702 m)   Body mass index is 48.4 kg/m. No exam data present  General: Morbidly obese pleasant middle-age Caucasian female, seated, in no evident distress Head: head normocephalic and atraumatic.   Neck: Circumference 18 inches Cardiovascular: regular rate and rhythm, no murmurs Musculoskeletal: no deformity Skin:  no rash/petichiae Vascular:  Normal pulses all extremities   Neurologic Exam Mental Status: Awake and fully alert.  Fluent speech and language.  Oriented to place and time. Recent and remote memory intact. Attention span, concentration and fund of knowledge appropriate. Mood and affect appropriate.  Cranial Nerves: Pupils equal, briskly reactive to light. Extraocular movements full without nystagmus. Visual fields full to confrontation. Hearing intact. Facial sensation intact. Face, tongue, palate moves normally and symmetrically.  Motor: Normal bulk and tone. Normal strength in all tested extremity muscles. Sensory.: intact to  touch , pinprick , position and vibratory sensation.  Coordination: Rapid alternating movements normal in all extremities. Finger-to-nose and heel-to-shin performed accurately bilaterally. Gait and Station: Arises from chair without difficulty. Stance is normal. Gait demonstrates normal stride length and balance Reflexes: 1+ and symmetric. Toes downgoing.        ASSESSMENT/PLAN: JAMESON MORROW is a 39 y.o. year old female with recent diagnosis of sleep apnea initiated on CPAP in 07/2019.  She does work rotating day and night shifts which has caused difficulty with adequate compliance but will be starting a new position working Monday through Friday day shifts and is hopeful compliance will improve.   Will continue current settings and advised to continue to follow with DME company for any needed supplies or CPAP related concerns.  Advised her to call around February or March to reassess CPAP compliance data to ensure improvement of compliance.   Follow up in 6 months or call earlier if needed   CC:  [facility] provider: Dr. Joylene Grapes, Ivin Booty, MD    I spent 25 minutes of face-to-face and non-face-to-face time with patient.  This included previsit chart review, lab review, study review, order entry, electronic health record documentation, patient education and discussion regarding compliance data and review, difficulty with CPAP compliance and answered all other questions to patient satisfaction    Frann Rider, [facility]  [facility] [address] [place],  [address]  Phone [phone] Fax [phone] Note: This document was prepared with digital dictation and possible smart phrase technology. Any transcriptional errors that result from this process are unintentional.  I reviewed the above note and documentation by the Nurse Practitioner and agree with the history, exam, assessment and plan as outlined above. I was available for  consultation. Star Age, MD, PhD  [facility])

## 2020-04-11 ENCOUNTER — Encounter (INDEPENDENT_AMBULATORY_CARE_PROVIDER_SITE_OTHER): Payer: Self-pay | Admitting: Family Medicine

## 2020-04-11 ENCOUNTER — Other Ambulatory Visit: Payer: Self-pay

## 2020-04-11 ENCOUNTER — Telehealth (INDEPENDENT_AMBULATORY_CARE_PROVIDER_SITE_OTHER): Payer: 59 | Admitting: Family Medicine

## 2020-04-11 DIAGNOSIS — F419 Anxiety disorder, unspecified: Secondary | ICD-10-CM

## 2020-04-11 DIAGNOSIS — Z6841 Body Mass Index (BMI) 40.0 and over, adult: Secondary | ICD-10-CM

## 2020-04-11 DIAGNOSIS — E559 Vitamin D deficiency, unspecified: Secondary | ICD-10-CM | POA: Diagnosis not present

## 2020-04-11 DIAGNOSIS — F32A Depression, unspecified: Secondary | ICD-10-CM

## 2020-04-11 MED ORDER — VITAMIN D (ERGOCALCIFEROL) 1.25 MG (50000 UNIT) PO CAPS: 50000.0000 [IU] | ORAL_CAPSULE | ORAL | 0 refills | Status: DC

## 2020-04-11 MED ORDER — ESCITALOPRAM OXALATE 20 MG PO TABS: 20.0000 mg | ORAL_TABLET | Freq: Every day | ORAL | 0 refills | Status: DC

## 2020-04-11 MED ORDER — WEGOVY 1 MG/0.5ML ~~LOC~~ SOAJ: 1.0000 mg | SUBCUTANEOUS | 0 refills | Status: DC

## 2020-04-15 NOTE — Progress Notes (Signed)
TeleHealth Visit:  Due to the COVID-19 pandemic, this visit was completed with telemedicine (audio/video) technology to reduce patient and provider exposure as well as to preserve personal protective equipment.   Janice Wolf has verbally consented to this TeleHealth visit. The patient is located at home, the provider is located at the [facility] office. The participants in this visit include the listed provider and patient. The visit was conducted today via video.  Chief Complaint: OBESITY Janice Wolf is here to discuss her progress with her obesity treatment plan along with follow-up of her obesity related diagnoses. Janice Wolf is on the [employer] and states she is following her eating plan approximately 80% of the time. Janice Wolf states she is exercising 30 minutes 3 times per week.  Today's visit was #: 22 Starting weight: 304 lbs Starting date: 07/14/2019  Interim History: Janice Wolf voices that she had familial and professional stress. She was just offered a new role at work M-F, which may start Jan/Feb. Janice Wolf does report she is down 2 pant sizes. She is doing 2 meals of Freshly a day. She is not feeling the satiating effects of Wegovy. Janice Wolf voices her husband is currently really struggling with depression. She is still not eating breakfast.  Subjective:   1. Vitamin D deficiency Janice Wolf denies nausea, vomiting, and muscle weakness but does note fatigue. She is on prescription Vit D.  2. Anxiety and depression Janice Wolf denies suicidal or homicidal ideations. She is on Lexapro daily. And notes that symptoms are better controlled.  Assessment/Plan:   1. Vitamin D deficiency Low Vitamin D level contributes to fatigue and are associated with obesity, breast, and colon cancer. She agrees to continue to take prescription Vitamin D @50 ,000 IU every week and will follow-up for routine testing of Vitamin D, at least 2-3 times per year to avoid over-replacement. Janice Wolf  has agreed to continue Vit D, and we will refill for 1 month supply.  - Vitamin D, Ergocalciferol, (DRISDOL) 1.25 MG (50000 UNIT) CAPS capsule; Take 1 capsule (50,000 Units total) by mouth every 7 (seven) days.  Dispense: 4 capsule; Refill: 0  2. Anxiety and depression Behavior modification techniques were discussed today to help Janice Wolf deal with her anxiety.  Orders and follow up as documented in patient record. Janice Wolf has agreed to continue Lexapro 20 mg, and we will refill for 1 month supply.  - escitalopram (LEXAPRO) 20 MG tablet; Take 1 tablet (20 mg total) by mouth daily.  Dispense: 30 tablet; Refill: 0  3. Class 3 severe obesity with serious comorbidity and body mass index (BMI) of 45.0 to 49.9 in adult, unspecified obesity type Janice Wolf) Janice Wolf is currently in the action stage of change. As such, her goal is to continue with weight loss efforts. She has agreed to the [employer].   Exercise goals: As is. Janice Wolf is incorporating more activity in her weekly life.  We discussed various medication options to help Janice Wolf with her weight loss efforts and we both agreed to continue Nason, and we will refill for 1 month supply.  - Semaglutide-Weight Management (WEGOVY) 1 MG/0.5ML SOAJ; Inject 1 mg into the skin once a week.  Dispense: 2 mL; Refill: 0  Behavioral modification strategies: increasing lean protein intake, meal planning and cooking strategies, keeping healthy foods in the home and planning for success.  Janice Wolf has agreed to follow-up with our clinic in 4 weeks. She was informed of the importance of frequent follow-up visits to maximize her success with intensive lifestyle modifications for  her multiple health conditions.  Objective:   VITALS: Per patient if applicable, see vitals. GENERAL: Alert and in no acute distress. CARDIOPULMONARY: No increased WOB. Speaking in clear sentences.  PSYCH: Pleasant and cooperative. Speech normal rate and rhythm. Affect is  appropriate. Insight and judgement are appropriate. Attention is focused, linear, and appropriate.  NEURO: Oriented as arrived to appointment on time with no prompting.   No results found for: CREATININE, BUN, NA, K, CL, CO2 No results found for: ALT, AST, GGT, ALKPHOS, BILITOT Lab Results  Component Value Date   HGBA1C 5.4 07/18/2019   HGBA1C 5.4 10/25/2018   HGBA1C 5.6 07/14/2018   Lab Results  Component Value Date   INSULIN 29.1 (H) 07/18/2019   INSULIN 52.2 (H) 10/25/2018   INSULIN 35.5 (H) 07/14/2018   Lab Results  Component Value Date   TSH 3.180 07/18/2019    Attestation Statements:   Reviewed by clinician on day of visit: allergies, medications, problem list, medical history, surgical history, family history, social history, and previous encounter notes.  Coral Ceo, am acting as transcriptionist for Coralie Common, MD.  I have reviewed the above documentation for accuracy and completeness, and I agree with the above. - Jinny Blossom, MD

## 2020-05-06 ENCOUNTER — Telehealth (INDEPENDENT_AMBULATORY_CARE_PROVIDER_SITE_OTHER): Payer: 59 | Admitting: Family Medicine

## 2020-05-06 ENCOUNTER — Other Ambulatory Visit: Payer: Self-pay

## 2020-05-06 ENCOUNTER — Encounter (INDEPENDENT_AMBULATORY_CARE_PROVIDER_SITE_OTHER): Payer: Self-pay | Admitting: Family Medicine

## 2020-05-06 DIAGNOSIS — E559 Vitamin D deficiency, unspecified: Secondary | ICD-10-CM | POA: Diagnosis not present

## 2020-05-06 DIAGNOSIS — Z6841 Body Mass Index (BMI) 40.0 and over, adult: Secondary | ICD-10-CM | POA: Diagnosis not present

## 2020-05-06 DIAGNOSIS — F3289 Other specified depressive episodes: Secondary | ICD-10-CM | POA: Diagnosis not present

## 2020-05-06 MED ORDER — WEGOVY 1 MG/0.5ML ~~LOC~~ SOAJ: 1.0000 mg | SUBCUTANEOUS | 0 refills | Status: DC

## 2020-05-06 MED ORDER — BUPROPION HCL ER (SR) 150 MG PO TB12: 150.0000 mg | ORAL_TABLET | Freq: Every day | ORAL | 0 refills | Status: DC

## 2020-05-06 MED ORDER — VITAMIN D (ERGOCALCIFEROL) 1.25 MG (50000 UNIT) PO CAPS: 50000.0000 [IU] | ORAL_CAPSULE | ORAL | 0 refills | Status: DC

## 2020-05-06 MED ORDER — ESCITALOPRAM OXALATE 20 MG PO TABS: 20.0000 mg | ORAL_TABLET | Freq: Every day | ORAL | 0 refills | Status: DC

## 2020-05-08 NOTE — Progress Notes (Signed)
TeleHealth Visit:  Due to the COVID-19 pandemic, this visit was completed with telemedicine (audio/video) technology to reduce patient and provider exposure as well as to preserve personal protective equipment.   Janice Wolf has verbally consented to this TeleHealth visit. The patient is located at home, the provider is located at the [facility] office. The participants in this visit include the listed provider and patient. The visit was conducted today via MyChart video.  Chief Complaint: OBESITY Janice Wolf is here to discuss her progress with her obesity treatment plan along with follow-up of her obesity related diagnoses. Janice Wolf is on the [employer] and states she is following her eating plan approximately 80% of the time. Janice Wolf states she is not exercising regularly at this time.  Today's visit was #: 23 Starting weight: 304 lbs Starting date: 07/14/2019  Interim History: Janice Wolf has been mostly working for the last few weeks.  She has worked almost everyday (including Christmas and New Years).  She has felt very stressed from work and her husband's depression around the holiday.  Denies hunger.  Able to get all food in most of the time.  Doing Oreo's for snack calories.  Doing a meal service for dinner meal.  Subjective:   1. Vitamin D deficiency Janice Wolf Vitamin D level was 28.3 on 07/18/2019. She is currently taking prescription vitamin D 50,000 IU each week. She denies nausea, vomiting or muscle weakness.  She endorses fatigue.  2. Other depression, with emotional eating Denies SI/HI.  On Lexapro.  Not feeling the same effect as previously at 20 mg Lexapro.  No history of seizures.  Assessment/Plan:   1. Vitamin D deficiency Low Vitamin D level contributes to fatigue and are associated with obesity, breast, and colon cancer. She agrees to continue to take prescription Vitamin D @50 ,000 IU every week and will follow-up for routine testing of  Vitamin D, at least 2-3 times per year to avoid over-replacement.  Will refill vitamin D, as per below.  -Refill Vitamin D, Ergocalciferol, (DRISDOL) 1.25 MG (50000 UNIT) CAPS capsule; Take 1 capsule (50,000 Units total) by mouth every 7 (seven) days.  Dispense: 4 capsule; Refill: 0  2. Other depression, with emotional eating Will refill Lexapro and Wellbutrin today, as per below.  -Refill buPROPion (WELLBUTRIN SR) 150 MG 12 hr tablet; Take 1 tablet (150 mg total) by mouth daily.  Dispense: 30 tablet; Refill: 0 -Refill escitalopram (LEXAPRO) 20 MG tablet; Take 1 tablet (20 mg total) by mouth daily.  Dispense: 30 tablet; Refill: 0  3. Class 3 severe obesity with serious comorbidity and body mass index (BMI) of 45.0 to 49.9 in adult, unspecified obesity type (HCC)  -Refill Semaglutide-Weight Management (WEGOVY) 1 MG/0.5ML SOAJ; Inject 1 mg into the skin once a week.  Dispense: 2 mL; Refill: 0  Janice Wolf is currently in the action stage of change. As such, her goal is to continue with weight loss efforts. She has agreed to the [employer] +300 calories.   Exercise goals: All adults should avoid inactivity. Some physical activity is better than none, and adults who participate in any amount of physical activity gain some health benefits.  Behavioral modification strategies: increasing lean protein intake, meal planning and cooking strategies, keeping healthy foods in the home and planning for success.  Janice Wolf has agreed to follow-up with our clinic in 4 weeks. She was informed of the importance of frequent follow-up visits to maximize her success with intensive lifestyle modifications for her multiple health conditions.  Objective:   VITALS: Per patient if applicable, see vitals. GENERAL: Alert and in no acute distress. CARDIOPULMONARY: No increased WOB. Speaking in clear sentences.  PSYCH: Pleasant and cooperative. Speech normal rate and rhythm. Affect is appropriate. Insight and  judgement are appropriate. Attention is focused, linear, and appropriate.  NEURO: Oriented as arrived to appointment on time with no prompting.   Lab Results  Component Value Date   HGBA1C 5.4 07/18/2019   HGBA1C 5.4 10/25/2018   HGBA1C 5.6 07/14/2018   Lab Results  Component Value Date   INSULIN 29.1 (H) 07/18/2019   INSULIN 52.2 (H) 10/25/2018   INSULIN 35.5 (H) 07/14/2018   Lab Results  Component Value Date   TSH 3.180 07/18/2019   Attestation Statements:   Reviewed by clinician on day of visit: allergies, medications, problem list, medical history, surgical history, family history, social history, and previous encounter notes.  I, Insurance claims handler, CMA, am acting as transcriptionist for Reuben Likes, MD.  I have reviewed the above documentation for accuracy and completeness, and I agree with the above. - Katherina Mires, MD

## 2020-05-15 ENCOUNTER — Other Ambulatory Visit (INDEPENDENT_AMBULATORY_CARE_PROVIDER_SITE_OTHER): Payer: Self-pay | Admitting: Family Medicine

## 2020-05-30 ENCOUNTER — Telehealth (INDEPENDENT_AMBULATORY_CARE_PROVIDER_SITE_OTHER): Payer: 59 | Admitting: Family Medicine

## 2020-05-30 ENCOUNTER — Encounter (INDEPENDENT_AMBULATORY_CARE_PROVIDER_SITE_OTHER): Payer: Self-pay

## 2020-06-03 ENCOUNTER — Encounter (INDEPENDENT_AMBULATORY_CARE_PROVIDER_SITE_OTHER): Payer: Self-pay

## 2020-06-03 ENCOUNTER — Other Ambulatory Visit (INDEPENDENT_AMBULATORY_CARE_PROVIDER_SITE_OTHER): Payer: Self-pay | Admitting: Family Medicine

## 2020-06-03 DIAGNOSIS — F3289 Other specified depressive episodes: Secondary | ICD-10-CM

## 2020-06-03 NOTE — Telephone Encounter (Signed)
Message sent to pt-CAS 

## 2020-06-10 ENCOUNTER — Encounter (INDEPENDENT_AMBULATORY_CARE_PROVIDER_SITE_OTHER): Payer: Self-pay | Admitting: Family Medicine

## 2020-06-10 ENCOUNTER — Other Ambulatory Visit: Payer: Self-pay

## 2020-06-10 ENCOUNTER — Ambulatory Visit (INDEPENDENT_AMBULATORY_CARE_PROVIDER_SITE_OTHER): Payer: 59 | Admitting: Family Medicine

## 2020-06-10 VITALS — BP 95/68 | HR 79 | Temp 97.4°F | Ht 67.0 in | Wt 302.0 lb

## 2020-06-10 DIAGNOSIS — Z6841 Body Mass Index (BMI) 40.0 and over, adult: Secondary | ICD-10-CM

## 2020-06-10 DIAGNOSIS — Z9189 Other specified personal risk factors, not elsewhere classified: Secondary | ICD-10-CM

## 2020-06-10 DIAGNOSIS — E8881 Metabolic syndrome: Secondary | ICD-10-CM | POA: Diagnosis not present

## 2020-06-10 DIAGNOSIS — F419 Anxiety disorder, unspecified: Secondary | ICD-10-CM | POA: Diagnosis not present

## 2020-06-10 DIAGNOSIS — E559 Vitamin D deficiency, unspecified: Secondary | ICD-10-CM | POA: Diagnosis not present

## 2020-06-10 DIAGNOSIS — F32A Depression, unspecified: Secondary | ICD-10-CM

## 2020-06-10 MED ORDER — ESCITALOPRAM OXALATE 20 MG PO TABS
20.0000 mg | ORAL_TABLET | Freq: Every day | ORAL | 0 refills | Status: DC
Start: 2020-06-10 — End: 2020-08-20

## 2020-06-10 MED ORDER — VITAMIN D (ERGOCALCIFEROL) 1.25 MG (50000 UNIT) PO CAPS: 50000.0000 [IU] | ORAL_CAPSULE | ORAL | 0 refills | Status: DC

## 2020-06-10 MED ORDER — BUPROPION HCL ER (SR) 150 MG PO TB12
150.0000 mg | ORAL_TABLET | Freq: Every day | ORAL | 0 refills | Status: DC
Start: 2020-06-10 — End: 2020-08-20

## 2020-06-11 LAB — COMPREHENSIVE METABOLIC PANEL
ALT: 36 IU/L — ABNORMAL HIGH (ref 0–32)
AST: 29 IU/L (ref 0–40)
Albumin/Globulin Ratio: 1.3 (ref 1.2–2.2)
Albumin: 3.8 g/dL (ref 3.8–4.8)
Alkaline Phosphatase: 85 IU/L (ref 44–121)
BUN/Creatinine Ratio: 12 (ref 9–23)
BUN: 10 mg/dL (ref 6–20)
Bilirubin Total: 0.3 mg/dL (ref 0.0–1.2)
CO2: 21 mmol/L (ref 20–29)
Calcium: 9 mg/dL (ref 8.7–10.2)
Chloride: 104 mmol/L (ref 96–106)
Creatinine, Ser: 0.81 mg/dL (ref 0.57–1.00)
GFR calc Af Amer: 107 mL/min/{1.73_m2} (ref >59)
GFR calc non Af Amer: 92 mL/min/{1.73_m2} (ref >59)
Globulin, Total: 2.9 g/dL (ref 1.5–4.5)
Glucose: 88 mg/dL (ref 65–99)
Potassium: 4.2 mmol/L (ref 3.5–5.2)
Sodium: 141 mmol/L (ref 134–144)
Total Protein: 6.7 g/dL (ref 6.0–8.5)

## 2020-06-11 LAB — VITAMIN D 25 HYDROXY (VIT D DEFICIENCY, FRACTURES): Vit D, 25-Hydroxy: 26.2 ng/mL — ABNORMAL LOW (ref 30.0–100.0)

## 2020-06-11 LAB — INSULIN, RANDOM: INSULIN: 139 u[IU]/mL — ABNORMAL HIGH (ref 2.6–24.9)

## 2020-06-11 LAB — HEMOGLOBIN A1C
Est. average glucose Bld gHb Est-mCnc: 111 mg/dL
Hgb A1c MFr Bld: 5.5 % (ref 4.8–5.6)

## 2020-06-11 NOTE — Progress Notes (Signed)
Chief Complaint:   OBESITY Janice Wolf is here to discuss her progress with her obesity treatment plan along with follow-up of her obesity related diagnoses. Janice Wolf is on the Category 3 Plan and the Category 4 Plan and states she is following her eating plan approximately 30-40% of the time. Janice Wolf states she is doing 0 minutes 0 times per week.  Today's visit was #: 24 Starting weight: 304 lbs Starting date: 07/14/2019 Today's weight: 302 lbs Today's date: 06/10/2020 Total lbs lost to date: 2 lbs Total lbs lost since last in-office visit: 3 lbs  Interim History: Janice Wolf is recovering from [place] and today is her 1st day back since getting ill. She will likely be transitioning to day shift. She hasn't eaten much secondary to infection. She has food in the house for category plan. She did not get Wegovy last refill secondary to feeling like it's not working. She is not sure she could eat all food on plan.  Subjective:   1. Vitamin D deficiency Pt denies nausea, vomiting, and muscle weakness but notes fatigue. Pt is on prescription Vit D.  2. Insulin resistance Last A1c was 5.4 and insulin level 29.1. Pt was previously on GLP-1 without much change.  3. Anxiety and depression Pt denies suicidal or homicidal ideations. She is taking Lexapro 20 mg daily. She has some familial stress. She is not feeling an overwhelmingly change in symptoms with addition of Wellbutrin.  4. At risk of diabetes mellitus Janice Wolf is at higher than average risk for developing diabetes due to obesity.    Assessment/Plan:   1. Vitamin D deficiency Low Vitamin D level contributes to fatigue and are associated with obesity, breast, and colon cancer. She agrees to continue to take prescription Vitamin D @50 ,000 IU every week and will follow-up for routine testing of Vitamin D, at least 2-3 times per year to avoid over-replacement.  - Vitamin D, Ergocalciferol, (DRISDOL) 1.25 MG (50000 UNIT) CAPS capsule;  Take 1 capsule (50,000 Units total) by mouth every 7 (seven) days.  Dispense: 4 capsule; Refill: 0 - VITAMIN D 25 Hydroxy (Vit-D Deficiency, Fractures)  2. Insulin resistance Janice Wolf will continue to work on weight loss, exercise, and decreasing simple carbohydrates to help decrease the risk of diabetes. Janice Wolf agreed to follow-up with [place] as directed to closely monitor her progress. Check labs today.  - Insulin, random - Hemoglobin A1c - Comprehensive metabolic panel  3. Anxiety and depression Behavior modification techniques were discussed today to help Janice Wolf deal with her anxiety.  Orders and follow up as documented in patient record.   - buPROPion (WELLBUTRIN SR) 150 MG 12 hr tablet; Take 1 tablet (150 mg total) by mouth daily.  Dispense: 30 tablet; Refill: 0 - escitalopram (LEXAPRO) 20 MG tablet; Take 1 tablet (20 mg total) by mouth daily.  Dispense: 30 tablet; Refill: 0  4. At risk of diabetes mellitus Janice Wolf was given approximately 15 minutes of diabetes education and counseling today. We discussed intensive lifestyle modifications today with an emphasis on weight loss as well as increasing exercise and decreasing simple carbohydrates in her diet. We also reviewed medication options with an emphasis on risk versus benefit of those discussed.   Repetitive spaced learning was employed today to elicit superior memory formation and behavioral change.  5. Class 3 severe obesity with serious comorbidity and body mass index (BMI) of 45.0 to 49.9 in adult, unspecified obesity type Janice Wolf is currently in the action stage of change. As such, her goal  is to continue with weight loss efforts. She has agreed to the Category 3 Plan.   Exercise goals: No exercise has been prescribed at this time.  Behavioral modification strategies: increasing lean protein intake, meal planning and cooking strategies, keeping healthy foods in the home and planning for success.  Janice Wolf has agreed  to follow-up with our clinic in 3-4 weeks. She was informed of the importance of frequent follow-up visits to maximize her success with intensive lifestyle modifications for her multiple health conditions.   Janice Wolf was informed we would discuss her lab results at her next visit unless there is a critical issue that needs to be addressed sooner. Janice Wolf agreed to keep her next visit at the agreed upon time to discuss these results.  Objective:   Blood pressure 95/68, pulse 79, temperature (!) 97.4 F (36.3 C), temperature source Oral, height 5\' 7"  (1.702 m), weight (!) 302 lb (137 kg), SpO2 98 %. Body mass index is 47.3 kg/m.  General: Cooperative, alert, well developed, in no acute distress. HEENT: Conjunctivae and lids unremarkable. Cardiovascular: Regular rhythm.  Lungs: Normal work of breathing. Neurologic: No focal deficits.   Lab Results  Component Value Date   CREATININE 0.81 06/10/2020   BUN 10 06/10/2020   NA 141 06/10/2020   K 4.2 06/10/2020   CL 104 06/10/2020   CO2 21 06/10/2020   Lab Results  Component Value Date   ALT 36 (H) 06/10/2020   AST 29 06/10/2020   ALKPHOS 85 06/10/2020   BILITOT 0.3 06/10/2020   Lab Results  Component Value Date   HGBA1C 5.5 06/10/2020   HGBA1C 5.4 07/18/2019   HGBA1C 5.4 10/25/2018   HGBA1C 5.6 07/14/2018   Lab Results  Component Value Date   INSULIN 139.0 (H) 06/10/2020   INSULIN 29.1 (H) 07/18/2019   INSULIN 52.2 (H) 10/25/2018   INSULIN 35.5 (H) 07/14/2018   Lab Results  Component Value Date   TSH 3.180 07/18/2019   No results found for: CHOL, HDL, LDLCALC, LDLDIRECT, TRIG, CHOLHDL No results found for: WBC, HGB, HCT, MCV, PLT No results found for: IRON, TIBC, FERRITIN  Attestation Statements:   Reviewed by clinician on day of visit: allergies, medications, problem list, medical history, surgical history, family history, social history, and previous encounter notes.  Coral Ceo, am acting as  transcriptionist for Coralie Common, MD.   I have reviewed the above documentation for accuracy and completeness, and I agree with the above. - Jinny Blossom, MD

## 2020-07-02 ENCOUNTER — Encounter (INDEPENDENT_AMBULATORY_CARE_PROVIDER_SITE_OTHER): Payer: Self-pay

## 2020-07-02 ENCOUNTER — Ambulatory Visit (INDEPENDENT_AMBULATORY_CARE_PROVIDER_SITE_OTHER): Payer: 59 | Admitting: Family Medicine

## 2020-07-06 ENCOUNTER — Other Ambulatory Visit (INDEPENDENT_AMBULATORY_CARE_PROVIDER_SITE_OTHER): Payer: Self-pay | Admitting: Family Medicine

## 2020-07-06 DIAGNOSIS — F419 Anxiety disorder, unspecified: Secondary | ICD-10-CM

## 2020-07-06 DIAGNOSIS — F32A Depression, unspecified: Secondary | ICD-10-CM

## 2020-07-08 ENCOUNTER — Encounter (INDEPENDENT_AMBULATORY_CARE_PROVIDER_SITE_OTHER): Payer: Self-pay | Admitting: Family Medicine

## 2020-07-08 NOTE — Telephone Encounter (Signed)
Please advise 

## 2020-07-22 ENCOUNTER — Ambulatory Visit (INDEPENDENT_AMBULATORY_CARE_PROVIDER_SITE_OTHER): Payer: 59 | Admitting: Family Medicine

## 2020-08-20 ENCOUNTER — Other Ambulatory Visit (INDEPENDENT_AMBULATORY_CARE_PROVIDER_SITE_OTHER): Payer: Self-pay

## 2020-08-20 DIAGNOSIS — F32A Depression, unspecified: Secondary | ICD-10-CM

## 2020-08-20 DIAGNOSIS — F419 Anxiety disorder, unspecified: Secondary | ICD-10-CM

## 2020-08-20 MED ORDER — ESCITALOPRAM OXALATE 20 MG PO TABS: 20.0000 mg | ORAL_TABLET | Freq: Every day | ORAL | 0 refills | Status: DC

## 2020-08-20 MED ORDER — BUPROPION HCL ER (SR) 150 MG PO TB12: 150.0000 mg | ORAL_TABLET | Freq: Every day | ORAL | 0 refills | Status: DC

## 2020-08-26 ENCOUNTER — Ambulatory Visit (INDEPENDENT_AMBULATORY_CARE_PROVIDER_SITE_OTHER): Payer: 59 | Admitting: Family Medicine

## 2020-09-11 ENCOUNTER — Other Ambulatory Visit: Payer: Self-pay

## 2020-09-11 ENCOUNTER — Ambulatory Visit (INDEPENDENT_AMBULATORY_CARE_PROVIDER_SITE_OTHER): Payer: 59 | Admitting: Family Medicine

## 2020-09-11 ENCOUNTER — Encounter (INDEPENDENT_AMBULATORY_CARE_PROVIDER_SITE_OTHER): Payer: Self-pay | Admitting: Family Medicine

## 2020-09-11 VITALS — BP 110/75 | HR 75 | Temp 98.1°F | Ht 67.0 in | Wt 311.0 lb

## 2020-09-11 DIAGNOSIS — E559 Vitamin D deficiency, unspecified: Secondary | ICD-10-CM | POA: Diagnosis not present

## 2020-09-11 DIAGNOSIS — F32A Depression, unspecified: Secondary | ICD-10-CM

## 2020-09-11 DIAGNOSIS — E038 Other specified hypothyroidism: Secondary | ICD-10-CM

## 2020-09-11 DIAGNOSIS — Z9189 Other specified personal risk factors, not elsewhere classified: Secondary | ICD-10-CM

## 2020-09-11 DIAGNOSIS — F419 Anxiety disorder, unspecified: Secondary | ICD-10-CM

## 2020-09-11 DIAGNOSIS — Z6841 Body Mass Index (BMI) 40.0 and over, adult: Secondary | ICD-10-CM

## 2020-09-11 MED ORDER — BUPROPION HCL ER (SR) 150 MG PO TB12: 150.0000 mg | ORAL_TABLET | Freq: Every day | ORAL | 0 refills | Status: DC

## 2020-09-11 MED ORDER — LEVOTHYROXINE SODIUM 50 MCG PO TABS: 50.0000 ug | ORAL_TABLET | Freq: Every day | ORAL | 0 refills | Status: DC

## 2020-09-11 MED ORDER — ESCITALOPRAM OXALATE 20 MG PO TABS: 20.0000 mg | ORAL_TABLET | Freq: Every day | ORAL | 0 refills | Status: DC

## 2020-09-11 MED ORDER — VITAMIN D (ERGOCALCIFEROL) 1.25 MG (50000 UNIT) PO CAPS: 50000.0000 [IU] | ORAL_CAPSULE | ORAL | 0 refills | Status: DC

## 2020-09-12 NOTE — Progress Notes (Signed)
Chief Complaint:   OBESITY Janice Wolf is here to discuss her progress with her obesity treatment plan along with follow-up of her obesity related diagnoses. Janice Wolf is on the Category 3 Plan and states she is following her eating plan approximately 10% of the time. Janice Wolf states she is not currently exercising.  Today's visit was #: 25 Starting weight: 304 lbs Starting date: 07/14/2019 Today's weight: 311 lbs Today's date: 09/11/2020 Total lbs lost to date: 0 Total lbs lost since last in-office visit: 0  Interim History: Janice Wolf has had a lot of stress at work recently. She voices this will be her last appointment. She has been trying to get ahead but every time she is making leeway, some personal stressor happens and it's been a big setback. Pt has been having some stress eating.  Subjective:   1. Other specified hypothyroidism Janice Wolf is on Synthroid 50 mcg daily. Her last TSH was WNL at 3.180.  2. Vitamin D deficiency Janice Wolf's Vit D level of 26.2 on 06/10/2020. Pt denies nausea, vomiting, and muscle weakness but notes fatigue.  3. Anxiety and depression Pt denies suicidal or homicidal ideations. Janice Wolf reports lots of life stressors recently but relatively decently managed on meds.   4. At risk for depression Janice Wolf is at elevated risk of depression due to recent significant life stressors.  Assessment/Plan:   1. Other specified hypothyroidism Patient with long-standing hypothyroidism, on levothyroxine therapy. She appears euthyroid. Orders and follow up as documented in patient record.  Counseling . Good thyroid control is important for overall health. Supratherapeutic thyroid levels are dangerous and will not improve weight loss results. . The correct way to take levothyroxine is fasting, with water, separated by at least 30 minutes from breakfast, and separated by more than 4 hours from calcium, iron, multivitamins, acid reflux medications (PPIs).   -  levothyroxine (SYNTHROID) 50 MCG tablet; Take 1 tablet (50 mcg total) by mouth daily before breakfast.  Dispense: 90 tablet; Refill: 0  2. Vitamin D deficiency Low Vitamin D level contributes to fatigue and are associated with obesity, breast, and colon cancer. She agrees to continue to take prescription Vitamin D @50 ,000 IU every week and will follow-up for routine testing of Vitamin D, at least 2-3 times per year to avoid over-replacement.  - Vitamin D, Ergocalciferol, (DRISDOL) 1.25 MG (50000 UNIT) CAPS capsule; Take 1 capsule (50,000 Units total) by mouth every 7 (seven) days.  Dispense: 12 capsule; Refill: 0  3. Anxiety and depression Behavior modification techniques were discussed today to help Janice Wolf deal with her anxiety.  Orders and follow up as documented in patient record. Behavior modification techniques were discussed today to help Janice Wolf deal with her emotional/non-hunger eating behaviors.  Orders and follow up as documented in patient record.   - escitalopram (LEXAPRO) 20 MG tablet; Take 1 tablet (20 mg total) by mouth daily.  Dispense: 90 tablet; Refill: 0 - buPROPion (WELLBUTRIN SR) 150 MG 12 hr tablet; Take 1 tablet (150 mg total) by mouth daily.  Dispense: 90 tablet; Refill: 0  4. At risk for depression Janice Wolf was given approximately 15 minutes of depression risk counseling today. She has risk factors for depression including recent significant life stressors. We discussed the importance of a healthy work life balance, a healthy relationship with food and a good support system.  Repetitive spaced learning was employed today to elicit superior memory formation and behavioral change.  5. Class 3 severe obesity with serious comorbidity and body mass index (BMI) of  45.0 to 49.9 in adult, unspecified obesity type [facility])  Janice Wolf is currently in the action stage of change. As such, her goal is to continue with weight loss efforts. She has agreed to practicing portion control  and making smarter food choices, such as increasing vegetables and decreasing simple carbohydrates.   Exercise goals: All adults should avoid inactivity. Some physical activity is better than none, and adults who participate in any amount of physical activity gain some health benefits.  Behavioral modification strategies: increasing lean protein intake, meal planning and cooking strategies, keeping healthy foods in the home and planning for success.  Janice Wolf has agreed to follow-up with our clinic PRN. She was informed of the importance of frequent follow-up visits to maximize her success with intensive lifestyle modifications for her multiple health conditions.   Objective:   Blood pressure 110/75, pulse 75, temperature 98.1 F (36.7 C), height 5\' 7"  (1.702 m), weight (!) 311 lb (141.1 kg), SpO2 98 %. Body mass index is 48.71 kg/m.  General: Cooperative, alert, well developed, in no acute distress. HEENT: Conjunctivae and lids unremarkable. Cardiovascular: Regular rhythm.  Lungs: Normal work of breathing. Neurologic: No focal deficits.   Lab Results  Component Value Date   CREATININE 0.81 06/10/2020   BUN 10 06/10/2020   NA 141 06/10/2020   K 4.2 06/10/2020   CL 104 06/10/2020   CO2 21 06/10/2020   Lab Results  Component Value Date   ALT 36 (H) 06/10/2020   AST 29 06/10/2020   ALKPHOS 85 06/10/2020   BILITOT 0.3 06/10/2020   Lab Results  Component Value Date   HGBA1C 5.5 06/10/2020   HGBA1C 5.4 07/18/2019   HGBA1C 5.4 10/25/2018   HGBA1C 5.6 07/14/2018   Lab Results  Component Value Date   INSULIN 139.0 (H) 06/10/2020   INSULIN 29.1 (H) 07/18/2019   INSULIN 52.2 (H) 10/25/2018   INSULIN 35.5 (H) 07/14/2018   Lab Results  Component Value Date   TSH 3.180 07/18/2019   No results found for: CHOL, HDL, LDLCALC, LDLDIRECT, TRIG, CHOLHDL No results found for: WBC, HGB, HCT, MCV, PLT No results found for: IRON, TIBC, FERRITIN   Attestation Statements:    Reviewed by clinician on day of visit: allergies, medications, problem list, medical history, surgical history, family history, social history, and previous encounter notes.  Coral Ceo, CMA, am acting as transcriptionist for Coralie Common, MD.   I have reviewed the above documentation for accuracy and completeness, and I agree with the above. - Jinny Blossom, MD

## 2020-09-24 ENCOUNTER — Ambulatory Visit: Payer: 59 | Admitting: Adult Health

## 2020-09-24 ENCOUNTER — Encounter: Payer: Self-pay | Admitting: Adult Health

## 2020-09-24 ENCOUNTER — Telehealth: Payer: Self-pay | Admitting: Neurology

## 2020-09-24 VITALS — BP 115/83 | HR 81 | Ht 67.0 in | Wt 316.0 lb

## 2020-09-24 DIAGNOSIS — G4726 Circadian rhythm sleep disorder, shift work type: Secondary | ICD-10-CM | POA: Diagnosis not present

## 2020-09-24 DIAGNOSIS — G4719 Other hypersomnia: Secondary | ICD-10-CM | POA: Diagnosis not present

## 2020-09-24 DIAGNOSIS — Z9989 Dependence on other enabling machines and devices: Secondary | ICD-10-CM

## 2020-09-24 DIAGNOSIS — G4733 Obstructive sleep apnea (adult) (pediatric): Secondary | ICD-10-CM

## 2020-09-24 NOTE — Progress Notes (Addendum)
[facility] [address] [place]. [place] [address] [phone]       OFFICE FOLLOW UP NOTE  [name] Date of Birth:  1980/08/09 Medical Record Number:  [id number]   Reason for visit: CPAP compliance visit   SUBJECTIVE:   CHIEF COMPLAINT:  Chief Complaint  Patient presents with   Follow-up    TR  alone Pt struggles with her CPAP, just got new hoses yesterday. She sleep ok when she uses it and can tell a difference when she doesn't use it.     HPI:   Janice Wolf is a 40 year old female with underlying medical history of vitamin D deficiency, migraine headaches, IBS, hypothyroidism, low back pain and morbid obesity evaluated by Dr. Rexene Alberts on 05/09/2019 for snoring and excessive daytime somnolence with possible underlying sleep apnea.  Split-night 06/24/2019 showed overall mild OSA more pronounced in rem sleep with total AHI of 7.7/h, REM AHI of 30.9/h and O2 nadir of 86%.  Recommend initiating AutoPap for treatment.  Today, 09/24/2020, Ms. Plott returns for 74-month CPAP compliance visit  Review of compliance report, as below, shows satisfactory usage at 87% and optimal residual AHI at 1.1.  She does continue to struggle using CPAP due to straps constantly move up on the back of her head.  She has further discuss this with her DME company but unfortunately no other straps recommended.  She also continues to experience daytime fatigue but also still working "fast rotation" so her sleep cycle is altered.  ESS 11/24 (prior 12/24) and FSS 50/63 (prior 54/63).  Despite above concerns, she does note benefit when she uses vs when she doesn't as she will have increased fatigue and headaches.         ROS:   14 system review of systems performed and negative with exception of those listed in HPI    PMH:  Past Medical History:  Diagnosis Date   Abnormal thyroid blood test    no treatment yet, per pt.   Anxiety    Back pain    Complication of  anesthesia    was hard to wake up after T & A 12/2015; had to stay overnight   Dental crowns present    Depression    Dizziness    Fatty liver    Gallbladder problem    GERD (gastroesophageal reflux disease)    Hypothyroidism    IBS (irritable bowel syndrome)    Migraines    migraines   Painful orthopaedic hardware ([place]) 09/2016   left foot   PONV (postoperative nausea and vomiting)    Stuffy nose 09/03/2016   Vitamin D deficiency     PSH:  Past Surgical History:  Procedure Laterality Date   CHOLECYSTECTOMY     age 57   HARDWARE REMOVAL Left 10/01/2016   Procedure: HARDWARE REMOVAL left foot;  Surgeon: [name], MD;  Location: [facility];  Service: Orthopedics;  Laterality: Left;   TARSAL METATARSAL ARTHRODESIS Left 02/20/2016   Procedure: FIRST AND SECOND TARSAL METATARSAL ARTHRODESIS;  Surgeon: [name], MD;  Location: [facility];  Service: Orthopedics;  Laterality: Left;   TONSILLECTOMY AND ADENOIDECTOMY  12/2015    Social History:  Social History   Socioeconomic History   Marital status: Married    Spouse name: Janice Wolf   Number of children: 2   Years of education: Not on file   Highest education level: Not on file  Occupational History   Occupation: Quarry manager  Tobacco  Use   Smoking status: Never Smoker   Smokeless tobacco: Never Used  Vaping Use   Vaping Use: Never used  Substance and Sexual Activity   Alcohol use: No   Drug use: No   Sexual activity: Yes    Birth control/protection: I.U.D.  Other Topics Concern   Not on file  Social History Narrative   Not on file   Social Determinants of Health   Financial Resource Strain: Not on file  Food Insecurity: Not on file  Transportation Needs: Not on file  Physical Activity: Not on file  Stress: Not on file  Social Connections: Not on file  Intimate Partner Violence: Not on file    Family History:  Family History  Problem Relation Age of Onset   Diabetes Mother     Hypertension Mother    Anxiety disorder Mother    Obesity Father    Hyperlipidemia Father    Hypertension Father    Diabetes Father     Medications:   Current Outpatient Medications on File Prior to Visit  Medication Sig Dispense Refill   buPROPion (WELLBUTRIN SR) 150 MG 12 hr tablet Take 1 tablet (150 mg total) by mouth daily. 90 tablet 0   diclofenac Sodium (VOLTAREN) 1 % GEL SMARTSIG:2 Gram(s) Topical 4 Times Daily     escitalopram (LEXAPRO) 20 MG tablet Take 1 tablet (20 mg total) by mouth daily. 90 tablet 0   esomeprazole (NEXIUM) 20 MG capsule Take 20 mg by mouth daily at 12 noon.     gabapentin (NEURONTIN) 100 MG capsule Take 100 mg by mouth 3 (three) times a week. Only when working     hydrOXYzine (ATARAX/VISTARIL) 25 MG tablet Take 1 tablet (25 mg total) by mouth at bedtime. 30 tablet 0   ibuprofen (ADVIL) 200 MG tablet Take by mouth.     levonorgestrel (MIRENA) 20 MCG/24HR IUD 1 each by Intrauterine route once.     levothyroxine (SYNTHROID) 50 MCG tablet Take 1 tablet (50 mcg total) by mouth daily before breakfast. 90 tablet 0   lidocaine (XYLOCAINE) 5 % ointment APPLY TO AFFECTED AREA 1 4 TIMES DAILY AS NEEDED     Multiple Vitamins-Minerals (MULTIVITAMIN WITH MINERALS) tablet Take 1 tablet by mouth daily.     Vitamin D, Ergocalciferol, (DRISDOL) 1.25 MG (50000 UNIT) CAPS capsule Take 1 capsule (50,000 Units total) by mouth every 7 (seven) days. 12 capsule 0   No current facility-administered medications on file prior to visit.    Allergies:   Allergies  Allergen Reactions   Aspirin Nausea And Vomiting   Latex Other (See Comments)    SKIN IRRITATION   Other Rash    PERFUMES      OBJECTIVE:  Physical Exam  Vitals:   09/24/20 1532  BP: 115/83  Pulse: 81  Weight: (!) 316 lb (143.3 kg)  Height: 5\' 7"  (1.702 m)   Body mass index is 49.49 kg/m. No exam data present  General: Morbidly obese pleasant middle-age Caucasian female, seated, in no evident  distress Head: head normocephalic and atraumatic.   Neck: Circumference 18 inches Cardiovascular: regular rate and rhythm, no murmurs Musculoskeletal: no deformity Skin:  no rash/petichiae Vascular:  Normal pulses all extremities   Neurologic Exam Mental Status: Awake and fully alert.  Fluent speech and language.  Oriented to place and time. Recent and remote memory intact. Attention span, concentration and fund of knowledge appropriate. Mood and affect appropriate.  Cranial Nerves: Pupils equal, briskly reactive to light. Extraocular movements full  without nystagmus. Visual fields full to confrontation. Hearing intact. Facial sensation intact. Face, tongue, palate moves normally and symmetrically.  Motor: Normal bulk and tone. Normal strength in all tested extremity muscles. Sensory.: intact to touch , pinprick , position and vibratory sensation.  Coordination: Rapid alternating movements normal in all extremities. Finger-to-nose and heel-to-shin performed accurately bilaterally. Gait and Station: Arises from chair without difficulty. Stance is normal. Gait demonstrates normal stride length and balance Reflexes: 1+ and symmetric. Toes downgoing.        ASSESSMENT/PLAN: KATHERIN RAMEY is a 40 y.o. year old female with mild sleep apnea on CPAP followed by Dr. Rexene Alberts.   Adequate compliance at 87% and optimal residual AHI 1.1 Continue current treatment settings - order will be placed to [place] to try a low ponytail to help hold mask straps in place or possibly look online for other tips/tricks Due to continue high ESS and FSS, will further discuss with Dr. Rexene Alberts to see if she would like to proceed with MLST to rule out narcolepsy although this may also be due to "fast rotation" shift work and home/work stressors - she will willing to come off her antidepressants if needed   Follow up in 1 year or call earlier if needed   CC:  [place] provider: Dr. Joylene Grapes, Ivin Booty, MD      Frann Rider, [facility]  [facility] [address] [place], [place] [address]  Phone [phone] Fax [phone] Note: This document was prepared with digital dictation and possible smart phrase technology. Any transcriptional errors that result from this process are unintentional.  I reviewed the above note and documentation by the Nurse Practitioner and agree with the history, exam, assessment and plan as outlined above. We can pursue further evaluation for an underlying hypersomnolence disorder.  I was available for consultation. Star Age, MD, PhD [facility])

## 2020-09-24 NOTE — Addendum Note (Signed)
Addended by: Mal Misty on: 09/24/2020 04:17 PM   Modules accepted: Orders

## 2020-09-24 NOTE — Telephone Encounter (Signed)
Please call patient and advise her that we can look into her residual sleepiness by doing a nighttime sleep study and next day nap study. For the nighttime study, we will keep her on her autoPAP or CPAP around her average pressure and during the day, she will have 5 nap times. However, in preparation for this type of sleep study testing, we will have to have her taper off of certain medication, which can interfere with sleep and also cause daytime sleepiness. She would have to be off:  Gabapentin, wellbutrin, lexapro and hydroxyzine. Please let me know if she would like to proceed. I would encourage her to discuss with her prescribing physician, whether it is okay for her to come off these meds; she would have to be off of the meds for at least 2 weeks prior to sleep study testing. Based on her visit with Janett Billow and her discussion with me, we can certainly proceed with additional testing at this time. Please let me know.

## 2020-09-25 NOTE — Telephone Encounter (Signed)
I called the pt and we discuss the note from Dr. Rexene Alberts. Pt verbalized understanding and will discuss with the prescribing MD's on tapering of the Gabapentin, wellbutrin, lexapro and hydroxyzine. Once pt has this information she will CB and let us know.

## 2020-10-02 NOTE — Progress Notes (Addendum)
CM sent to [facility] 

## 2021-02-24 ENCOUNTER — Other Ambulatory Visit (INDEPENDENT_AMBULATORY_CARE_PROVIDER_SITE_OTHER): Payer: Self-pay | Admitting: Family Medicine

## 2021-02-24 DIAGNOSIS — E038 Other specified hypothyroidism: Secondary | ICD-10-CM

## 2021-03-24 ENCOUNTER — Encounter: Payer: Self-pay | Admitting: Adult Health

## 2021-03-25 NOTE — Telephone Encounter (Signed)
Yes those can be filled out. Thank you

## 2021-03-25 NOTE — Telephone Encounter (Signed)
Gave completed/signed form back to medical records to process for pt. 

## 2021-09-24 ENCOUNTER — Ambulatory Visit: Payer: 59 | Admitting: Adult Health

## 2021-11-14 ENCOUNTER — Other Ambulatory Visit: Payer: Self-pay | Admitting: Gastroenterology

## 2021-11-14 DIAGNOSIS — R1030 Lower abdominal pain, unspecified: Secondary | ICD-10-CM

## 2021-11-18 ENCOUNTER — Other Ambulatory Visit: Payer: Self-pay | Admitting: Gastroenterology

## 2021-11-20 ENCOUNTER — Inpatient Hospital Stay: Admission: RE | Admit: 2021-11-20 | Payer: 59 | Source: Ambulatory Visit

## 2021-11-25 ENCOUNTER — Ambulatory Visit
Admission: RE | Admit: 2021-11-25 | Discharge: 2021-11-25 | Disposition: A | Discharge status: Home / Self Care | Payer: 59 | Source: Ambulatory Visit | Attending: Gastroenterology | Admitting: Gastroenterology

## 2021-11-25 DIAGNOSIS — R1030 Lower abdominal pain, unspecified: Secondary | ICD-10-CM

## 2021-11-25 MED ORDER — IOPAMIDOL (ISOVUE-300) INJECTION 61%
100.0000 mL | Freq: Once | INTRAVENOUS | Status: AC | PRN
  Administered 2021-11-25: 100 mL via INTRAVENOUS

## 2021-12-01 ENCOUNTER — Other Ambulatory Visit: Payer: Self-pay | Admitting: Gastroenterology

## 2021-12-01 DIAGNOSIS — R935 Abnormal findings on diagnostic imaging of other abdominal regions, including retroperitoneum: Secondary | ICD-10-CM

## 2021-12-05 ENCOUNTER — Ambulatory Visit
Admission: RE | Admit: 2021-12-05 | Discharge: 2021-12-05 | Disposition: A | Discharge status: Home / Self Care | Payer: 59 | Source: Ambulatory Visit | Attending: Gastroenterology | Admitting: Gastroenterology

## 2021-12-05 DIAGNOSIS — R935 Abnormal findings on diagnostic imaging of other abdominal regions, including retroperitoneum: Secondary | ICD-10-CM

## 2021-12-10 ENCOUNTER — Encounter (INDEPENDENT_AMBULATORY_CARE_PROVIDER_SITE_OTHER): Payer: Self-pay

## 2022-01-12 ENCOUNTER — Other Ambulatory Visit: Payer: Self-pay | Admitting: General Surgery

## 2022-01-12 ENCOUNTER — Other Ambulatory Visit (HOSPITAL_COMMUNITY): Payer: Self-pay | Admitting: General Surgery

## 2022-01-13 ENCOUNTER — Ambulatory Visit (HOSPITAL_COMMUNITY)
Admission: RE | Admit: 2022-01-13 | Discharge: 2022-01-13 | Disposition: A | Discharge status: Home / Self Care | Payer: 59 | Source: Ambulatory Visit | Attending: General Surgery | Admitting: General Surgery

## 2022-01-26 ENCOUNTER — Encounter: Payer: 59 | Attending: Family Medicine | Admitting: Dietician

## 2022-01-26 ENCOUNTER — Encounter: Payer: Self-pay | Admitting: Dietician

## 2022-01-26 DIAGNOSIS — E669 Obesity, unspecified: Secondary | ICD-10-CM | POA: Diagnosis present
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
## 2022-01-27 ENCOUNTER — Encounter (HOSPITAL_COMMUNITY): Payer: Self-pay | Admitting: Gastroenterology

## 2022-01-28 ENCOUNTER — Encounter (HOSPITAL_COMMUNITY): Payer: Self-pay | Admitting: Gastroenterology

## 2022-01-30 ENCOUNTER — Ambulatory Visit (INDEPENDENT_AMBULATORY_CARE_PROVIDER_SITE_OTHER): Payer: 59 | Admitting: Licensed Clinical Social Worker

## 2022-01-30 DIAGNOSIS — F411 Generalized anxiety disorder: Secondary | ICD-10-CM | POA: Diagnosis not present
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
## 2022-02-03 ENCOUNTER — Ambulatory Visit (HOSPITAL_BASED_OUTPATIENT_CLINIC_OR_DEPARTMENT_OTHER): Payer: 59 | Admitting: Certified Registered"

## 2022-02-03 ENCOUNTER — Encounter (HOSPITAL_COMMUNITY)
Admission: RE | Disposition: A | Discharge status: Home / Self Care | Payer: Self-pay | Source: Ambulatory Visit | Attending: Gastroenterology

## 2022-02-03 ENCOUNTER — Encounter (HOSPITAL_COMMUNITY): Payer: Self-pay | Admitting: Gastroenterology

## 2022-02-03 ENCOUNTER — Ambulatory Visit (HOSPITAL_COMMUNITY): Payer: 59 | Admitting: Certified Registered"

## 2022-02-03 ENCOUNTER — Other Ambulatory Visit: Payer: Self-pay

## 2022-02-03 ENCOUNTER — Ambulatory Visit (HOSPITAL_COMMUNITY)
Admission: RE | Admit: 2022-02-03 | Discharge: 2022-02-03 | Disposition: A | Discharge status: Home / Self Care | Payer: 59 | Source: Ambulatory Visit | Attending: Gastroenterology | Admitting: Gastroenterology

## 2022-02-03 DIAGNOSIS — Z6841 Body Mass Index (BMI) 40.0 and over, adult: Secondary | ICD-10-CM | POA: Insufficient documentation

## 2022-02-03 DIAGNOSIS — K635 Polyp of colon: Secondary | ICD-10-CM

## 2022-02-03 DIAGNOSIS — D120 Benign neoplasm of cecum: Secondary | ICD-10-CM | POA: Diagnosis not present

## 2022-02-03 DIAGNOSIS — K317 Polyp of stomach and duodenum: Secondary | ICD-10-CM | POA: Insufficient documentation

## 2022-02-03 DIAGNOSIS — K449 Diaphragmatic hernia without obstruction or gangrene: Secondary | ICD-10-CM

## 2022-02-03 DIAGNOSIS — K648 Other hemorrhoids: Secondary | ICD-10-CM | POA: Insufficient documentation

## 2022-02-03 DIAGNOSIS — K219 Gastro-esophageal reflux disease without esophagitis: Secondary | ICD-10-CM | POA: Insufficient documentation

## 2022-02-03 DIAGNOSIS — K760 Fatty (change of) liver, not elsewhere classified: Secondary | ICD-10-CM | POA: Insufficient documentation

## 2022-02-03 DIAGNOSIS — G4730 Sleep apnea, unspecified: Secondary | ICD-10-CM | POA: Insufficient documentation

## 2022-02-03 DIAGNOSIS — K625 Hemorrhage of anus and rectum: Secondary | ICD-10-CM | POA: Insufficient documentation

## 2022-02-03 DIAGNOSIS — S93325D Dislocation of tarsometatarsal joint of left foot, subsequent encounter: Secondary | ICD-10-CM

## 2022-02-03 DIAGNOSIS — F418 Other specified anxiety disorders: Secondary | ICD-10-CM | POA: Insufficient documentation

## 2022-02-03 DIAGNOSIS — K3189 Other diseases of stomach and duodenum: Secondary | ICD-10-CM

## 2022-02-03 DIAGNOSIS — E039 Hypothyroidism, unspecified: Secondary | ICD-10-CM

## 2022-02-03 DIAGNOSIS — K5900 Constipation, unspecified: Secondary | ICD-10-CM | POA: Diagnosis not present

## 2022-02-03 HISTORY — PX: ESOPHAGOGASTRODUODENOSCOPY: SHX5428

## 2022-02-03 HISTORY — DX: Sleep apnea, unspecified: G47.30

## 2022-02-03 HISTORY — PX: COLONOSCOPY WITH PROPOFOL: SHX5780

## 2022-02-03 HISTORY — PX: BIOPSY: SHX5522

## 2022-02-03 LAB — PREGNANCY, URINE: Preg Test, Ur: NEGATIVE

## 2022-02-03 LAB — GLUCOSE, CAPILLARY: Glucose-Capillary: 108 mg/dL — ABNORMAL HIGH (ref 70–99)

## 2022-02-03 SURGERY — COLONOSCOPY WITH PROPOFOL
Anesthesia: Monitor Anesthesia Care

## 2022-02-03 MED ORDER — LIDOCAINE HCL (CARDIAC) PF 100 MG/5ML IV SOSY
PREFILLED_SYRINGE | INTRAVENOUS | Status: DC | PRN
  Administered 2022-02-03: 100 mg via INTRAVENOUS

## 2022-02-03 MED ORDER — PROPOFOL 10 MG/ML IV BOLUS
INTRAVENOUS | Status: DC | PRN
  Administered 2022-02-03 (×2): 30 mg via INTRAVENOUS
  Administered 2022-02-03: 50 mg via INTRAVENOUS
  Administered 2022-02-03: 30 mg via INTRAVENOUS

## 2022-02-03 MED ORDER — LACTATED RINGERS IV SOLN
INTRAVENOUS | Status: DC
  Administered 2022-02-03: 1000 mL via INTRAVENOUS

## 2022-02-03 MED ORDER — SODIUM CHLORIDE 0.9 % IV SOLN: INTRAVENOUS | Status: DC

## 2022-02-03 MED ORDER — PROPOFOL 500 MG/50ML IV EMUL
INTRAVENOUS | Status: DC | PRN
  Administered 2022-02-03: 150 ug/kg/min via INTRAVENOUS

## 2022-02-03 SURGICAL SUPPLY — 22 items

## 2022-02-03 NOTE — Op Note (Signed)
[facility] Patient Name: Janice Wolf Procedure Date: 02/03/2022 MRN: [id number] Attending MD: Ronnette Juniper , MD Date of Birth: 12-14-1980 CSN: [id number] Age: 41 Admit Type: Outpatient Procedure:                Colonoscopy Indications:              This is the patient's first colonoscopy, Lower                            abdominal pain, Rectal bleeding, Constipation Providers:                Ronnette Juniper, MD, Velva Harman, RN, Hinton Dyer                            Technician, Technician Referring MD:             Candiss Norse Medicines:                Monitored Anesthesia Care Complications:            No immediate complications. Estimated blood loss:                            Minimal. Estimated Blood Loss:     Estimated blood loss was minimal. Procedure:                Pre-Anesthesia Assessment:                           - Prior to the procedure, a History and Physical                            was performed, and patient medications and                            allergies were reviewed. The patient's tolerance of                            previous anesthesia was also reviewed. The risks                            and benefits of the procedure and the sedation                            options and risks were discussed with the patient.                            All questions were answered, and informed consent                            was obtained. Prior Anticoagulants: The patient has                            taken no previous anticoagulant or antiplatelet                            agents. ASA  Grade Assessment: III - A patient with                            severe systemic disease. After reviewing the risks                            and benefits, the patient was deemed in                            satisfactory condition to undergo the procedure.                           After obtaining informed consent, the colonoscope                             was passed under direct vision. Throughout the                            procedure, the patient's blood pressure, pulse, and                            oxygen saturations were monitored continuously. The                            [id number]-HQ190L ([id number]) Olympus colonoscope was                            introduced through the anus and advanced to the the                            terminal ileum. The colonoscopy was performed                            without difficulty. The patient tolerated the                            procedure well. The quality of the bowel                            preparation was good. Scope In: 1:51:04 PM Scope Out: 2:04:42 PM Scope Withdrawal Time: 0 hours 11 minutes 40 seconds  Total Procedure Duration: 0 hours 13 minutes 38 seconds  Findings:      Hemorrhoids were found on perianal exam.      A 3 mm polyp was found in the cecum. The polyp was sessile. The polyp       was removed with a cold biopsy forceps. Resection and retrieval were       complete.      The terminal ileum appeared normal.      Non-bleeding internal hemorrhoids were found during retroflexion.      The exam was otherwise without abnormality. Impression:               - Hemorrhoids found on perianal exam.                           -  One 3 mm polyp in the cecum, removed with a cold                            biopsy forceps. Resected and retrieved.                           - The examined portion of the ileum was normal.                           - Non-bleeding internal hemorrhoids.                           - The examination was otherwise normal. Moderate Sedation:      Patient did not receive moderate sedation for this procedure, but       instead received monitored anesthesia care. Recommendation:           - Patient has a contact number available for                            emergencies. The signs and symptoms of potential                            delayed complications were discussed  with the                            patient. Return to normal activities tomorrow.                            Written discharge instructions were provided to the                            patient.                           - High fiber diet.                           - Continue present medications.                           - Await pathology results.                           - Repeat colonoscopy for surveillance based on                            pathology results. Procedure Code(s):        --- Professional ---                           [phone], Colonoscopy, flexible; with biopsy, single                            or multiple Diagnosis Code(s):        --- Professional ---  K64.8, Other hemorrhoids                           K63.5, Polyp of colon                           R10.30, Lower abdominal pain, unspecified                           K62.5, Hemorrhage of anus and rectum                           K59.00, Constipation, unspecified CPT copyright 2019 American Medical Association. All rights reserved. The codes documented in this report are preliminary and upon coder review may  be revised to meet current compliance requirements. Ronnette Juniper, MD 02/03/2022 2:12:49 PM This report has been signed electronically. Number of Addenda: 0

## 2022-02-03 NOTE — Discharge Instructions (Signed)
YOU HAD AN ENDOSCOPIC PROCEDURE TODAY: Refer to the procedure report and other information in the discharge instructions given to you for any specific questions about what was found during the examination. If this information does not answer your questions, please call the [facility] office at [phone] to clarify.   YOU SHOULD EXPECT: Some feelings of bloating in the abdomen. Passage of more gas than usual. Walking can help get rid of the air that was put into your GI tract during the procedure and reduce the bloating. If you had a lower endoscopy (such as a colonoscopy or flexible sigmoidoscopy) you may notice spotting of blood in your stool or on the toilet paper. Some abdominal soreness may be present for a day or two, also.  DIET: Your first meal following the procedure should be a light meal and then it is ok to progress to your normal diet. A half-sandwich or bowl of soup is an example of a good first meal. Heavy or fried foods are harder to digest and may make you feel nauseous or bloated. Drink plenty of fluids but you should avoid alcoholic beverages for 24 hours. If you had a esophageal dilation, please see attached instructions for diet.    ACTIVITY: Your care partner should take you home directly after the procedure. You should plan to take it easy, moving slowly for the rest of the day. You can resume normal activity the day after the procedure however YOU SHOULD NOT DRIVE, use power tools, machinery or perform tasks that involve climbing or major physical exertion for 24 hours (because of the sedation medicines used during the test).   SYMPTOMS TO REPORT IMMEDIATELY: A gastroenterologist can be reached at any hour. Please call [phone]  for any of the following symptoms:  Following lower endoscopy (colonoscopy, flexible sigmoidoscopy) Excessive amounts of blood in the stool  Significant tenderness, worsening of abdominal pains  Swelling of the abdomen that is new, acute  Fever of  100 or higher  Following upper endoscopy (EGD, EUS, ERCP, esophageal dilation) Vomiting of blood or coffee ground material  New, significant abdominal pain  New, significant chest pain or pain under the shoulder blades  Painful or persistently difficult swallowing  New shortness of breath  Black, tarry-looking or red, bloody stools  FOLLOW UP:  If any biopsies were taken you will be contacted by phone or by letter within the next 1-3 weeks. Call [phone]  if you have not heard about the biopsies in 3 weeks.  Please also call with any specific questions about appointments or follow up tests.  

## 2022-02-03 NOTE — Op Note (Signed)
[facility] Patient Name: Janice Wolf Procedure Date: 02/03/2022 MRN: [id number] Attending MD: Ronnette Juniper , MD Date of Birth: [birth year] CSN: [id number] Age: 41 Admit Type: Outpatient Procedure:                Upper GI endoscopy Indications:              Follow-up of gastro-esophageal reflux disease, Pre                            op clerance for bariatric surgery Providers:                Ronnette Juniper, MD, Velva Harman, RN, Hinton Dyer                            Technician, Technician Referring MD:             Candiss Norse Medicines:                Monitored Anesthesia Care Complications:            No immediate complications. Estimated blood loss:                            Minimal. Estimated Blood Loss:     Estimated blood loss was minimal. Procedure:                Pre-Anesthesia Assessment:                           - Prior to the procedure, a History and Physical                            was performed, and patient medications and                            allergies were reviewed. The patient's tolerance of                            previous anesthesia was also reviewed. The risks                            and benefits of the procedure and the sedation                            options and risks were discussed with the patient.                            All questions were answered, and informed consent                            was obtained. Prior Anticoagulants: The patient has                            taken no previous anticoagulant or antiplatelet                            agents.  ASA Grade Assessment: III - A patient with                            severe systemic disease. After reviewing the risks                            and benefits, the patient was deemed in                            satisfactory condition to undergo the procedure.                           After obtaining informed consent, the endoscope was                             passed under direct vision. Throughout the                            procedure, the patient's blood pressure, pulse, and                            oxygen saturations were monitored continuously. The                            [id number]-H190 ([id number]) Olympus endoscope was introduced                            through the mouth, and advanced to the second part                            of duodenum. The upper GI endoscopy was                            accomplished without difficulty. The patient                            tolerated the procedure well. Scope In: Scope Out: Findings:      The examined esophagus was normal.      The Z-line was regular.      A 1 cm hiatal hernia was present.      Localized mildly erythematous mucosa without bleeding was found in the       gastric antrum. Biopsies were taken with a cold forceps for Helicobacter       pylori testing.      The cardia and gastric fundus were normal on retroflexion.      The examined duodenum was normal.      A few small sessile polyps with no stigmata of recent bleeding were       found in the gastric body, apperance consistent with benign fundic gland       polyps. Impression:               - Normal esophagus.                           - Z-line regular.                           -  1 cm hiatal hernia.                           - Erythematous mucosa in the antrum. Biopsied.                           - Normal examined duodenum.                           - A few gastric polyps. Moderate Sedation:      Patient did not receive moderate sedation for this procedure, but       instead received monitored anesthesia care. Recommendation:           - Patient has a contact number available for                            emergencies. The signs and symptoms of potential                            delayed complications were discussed with the                            patient. Return to normal activities tomorrow.                             Written discharge instructions were provided to the                            patient.                           - Resume regular diet.                           - Continue present medications.                           - Await pathology results. Procedure Code(s):        --- Professional ---                           [phone], Esophagogastroduodenoscopy, flexible,                            transoral; with biopsy, single or multiple Diagnosis Code(s):        --- Professional ---                           K44.9, Diaphragmatic hernia without obstruction or                            gangrene                           K31.89, Other diseases of stomach and duodenum  K31.7, Polyp of stomach and duodenum                           K21.9, Gastro-esophageal reflux disease without                            esophagitis CPT copyright 2019 American Medical Association. All rights reserved. The codes documented in this report are preliminary and upon coder review may  be revised to meet current compliance requirements. Ronnette Juniper, MD 02/03/2022 2:10:14 PM This report has been signed electronically. Number of Addenda: 0

## 2022-02-03 NOTE — Anesthesia Preprocedure Evaluation (Addendum)
Anesthesia Evaluation  Patient identified by MRN, date of birth, ID band Patient awake    Reviewed: Allergy & Precautions, NPO status , Patient's Chart, lab work & pertinent test results  History of Anesthesia Complications (+) PONV, PROLONGED EMERGENCE and history of anesthetic complications  Airway Mallampati: III  TM Distance: >3 FB Neck ROM: Full    Dental  (+) Dental Advisory Given, Teeth Intact   Pulmonary sleep apnea and Continuous Positive Airway Pressure Ventilation ,    Pulmonary exam normal        Cardiovascular negative cardio ROS Normal cardiovascular exam     Neuro/Psych  Headaches, PSYCHIATRIC DISORDERS Anxiety Depression    GI/Hepatic Neg liver ROS, GERD  Medicated and Controlled,  Endo/Other  Hypothyroidism Morbid obesity  Renal/GU negative Renal ROS     Musculoskeletal negative musculoskeletal ROS (+)   Abdominal   Peds  Hematology negative hematology ROS (+)   Anesthesia Other Findings   Reproductive/Obstetrics                            Anesthesia Physical Anesthesia Plan  ASA: 3  Anesthesia Plan: MAC   Post-op Pain Management: Minimal or no pain anticipated   Induction:   PONV Risk Score and Plan: 3 and Propofol infusion and Treatment may vary due to age or medical condition  Airway Management Planned: Nasal Cannula and Natural Airway  Additional Equipment: None  Intra-op Plan:   Post-operative Plan:   Informed Consent: I have reviewed the patients History and Physical, chart, labs and discussed the procedure including the risks, benefits and alternatives for the proposed anesthesia with the patient or authorized representative who has indicated his/her understanding and acceptance.       Plan Discussed with: CRNA and Anesthesiologist  Anesthesia Plan Comments:        Anesthesia Quick Evaluation

## 2022-02-03 NOTE — Transfer of Care (Signed)
Immediate Anesthesia Transfer of Care Note  Patient: Janice Wolf  Procedure(s) Performed: COLONOSCOPY WITH PROPOFOL ESOPHAGOGASTRODUODENOSCOPY (EGD) BIOPSY  Patient Location: PACU  Anesthesia Type:MAC  Level of Consciousness: drowsy and patient cooperative  Airway & Oxygen Therapy: Patient Spontanous Breathing  Post-op Assessment: Report given to RN and Post -op Vital signs reviewed and stable  Post vital signs: Reviewed and stable  Last Vitals:  Vitals Value Taken Time  BP 106/72 02/03/22 1410  Temp 37.1 C 02/03/22 1410  Pulse 76 02/03/22 1412  Resp 17 02/03/22 1412  SpO2 92 % 02/03/22 1412  Vitals shown include unvalidated device data.  Last Pain:  Vitals:   02/03/22 1410  TempSrc: Oral  PainSc: 0-No pain         Complications: No notable events documented.

## 2022-02-03 NOTE — H&P (Signed)
HPI: Patient is a 41 year old female who presents for follow up of abdominal pain. Has upcoming EGD/Colonoscopy at [facility] 02/03/2022.        History of anxiety/depression, hypothyroidism, cholecystectomy age 54, chronic constipation.        At last visit 11/13/2021 patient reported persistent lower abdominal pain worse on the right side which improves with a bowel movement. MiraLAX helps some with constipation but still having bloating and hard stools and straining.  CT A/P was ordered to further evaluate.        CT A/P w/ cm 11/25/2021 - Hepatic steatosis and hepatomegaly, possible constipation, tiny hiatal hernia, mild pelvic floor laxity.        RUQ [place] 12/05/2021 - Hepatic steatosis        Dr. Redmond Pulling with [facility] recommended EGD with upcoming colonoscopy for consideration of bariatric surgery. Procedure added.        Patient states surgery plans to do a gastric bypass rather than a sleeve, but waiting on findings from endoscopy to move forward with plan for surgery.         She is having a bowel movement every 3 to 4 days taking MiraLAX on occasion reports her acid reflux is worse over the last couple months. She is taking OTC Nexium. Denies nausea, vomiting, fever, chills, dysphagia/odynophagia.        She continues to have some right-sided abdominal pain intermittently which often improves with a bowel movement.  Denies melena, hematochezia, diarrhea, unintentional weight loss.        Has an appointment with her PCP coming up soon.        Had blood work done recently which showed WBC 11.8, otherwise normal CBC, normal LFTs and total bilirubin.ROS:      GI PROCEDURE:        Pacemaker/ AICD no.  Artificial heart valves no.  MI/heart attack no.  Abnormal heart rhythm no.  Angina no.  CVA no.  Hypertension no.  Hypotension no.  Asthma, COPD no.  Sleep apnea no.  Seizure disorders no.  Artificial joints no.  Diabetes no.  Significant headaches no.  Vertigo no.  Depression/anxiety YES        .  Abnormal  bleeding no.  Kidney Disease no.  Liver disease no.  Chance of pregnancy no.  Blood transfusion no.         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 Doran Durand, hypothyroidism, neg abs, heterogenous [place], GYN: Dr. Linda Hedges, transaminitis > Negative viral hep panel 04/2017, Vit d def, 06/25/2019: mild OSA, AHI 7.7/hr, [facility], Weight loss clinic, Dr. Adair Patter > 06/2020, Migraines , COVID + 12/03/2020, Anxiety/depression, Add.Gyn History:     Periods : Mirena.     Last pap smear date UTD w/ [facility], normal.     Last mammogram date N/A.   Surgical History: Gall Bladder age 54, tonsillectomy due to tonsilliths 12/05/2015, Left foot - 1st and 2nd tarsometatarsal arthrodeiss, for Lisfranc dslocation 02/20/2016, Removal of hardware from left mid foot fusion due to pain 09/2016, RT leg .Hospitalization/Major Diagnostic Procedure: Food Poisoning -Salmonella age 66, Gall Bladder age 36, Tonsillectomy and Foot surgery 2017, not in the past year 11/2021.Family History:  Father: alive 58 yrs, diagnosed with Diabetes, Hypertension.  Mother: alive 89 yrs, diagnosed with Diabetes, Hypertension.  Sister 1: alive 55 yrs, A+W.  Sister 2: alive 76 yrs, A+W.  2 sister(s) - healthy. 1 son(s) ,  1 daughter(s) . [name] No Family History of Colon Cancer, Polyps, or Liver Disease.  Social History:     General:         Tobacco use            cigarettes:  Never smoked           Tobacco history last updated  01/28/2022           Vaping  No        EXPOSURE TO PASSIVE SMOKE: no.        Alcohol: no.        Caffeine: yes, Coffee 2 servings daily & tea 1 serving occasionally.        Recreational drug use: no, no.        DIET: regular.        Exercise: no, Stays active w/ work.        Marital Status: married.        Children: 1 son, 1 daughter.        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   originally [place], [place].  Medications: Taking  Cyclobenzaprine HCl 10 MG Tablet 1 tablet at bedtime as needed Orally Once a day , Taking Levothyroxine Sodium 100 MCG Tablet TAKE 1 TABLET BY MOUTH EVERY DAY ON EMPTY STOMACH IN THE MORNING , Taking buPROPion HCl ER (SR) 150 MG Tablet Extended Release 12 Hour 2 tablets Oral once a day , Taking Escitalopram Oxalate 20 MG Tablet 1 tablet Oral once a day , Taking Sertraline HCl 100 MG Tablet 1 tablet Orally Once a day , Notes to Pharmacist: Healthy Weight and Wellness, Taking Gabapentin 100 MG Capsule 4 capsules Orally Once a day , Taking Diclofenac Sodium 1 % Gel 2 pumps Externally Four times a day as needed , Taking Vitamin D3 25 MCG (1000 UT) Tablet 1 tablet Orally 2,000 UT, Notes to Pharmacist: 2,000 UT, Taking Multivitamin Adult(Multiple Vitamin) - Tablet 1 tablet Orally Once a day , Taking Abilify 5 MG Tablet 1 tablet Orally Once a day , Not-Taking Vitamin D (Ergocalciferol) 1.25 MG (50000 UT) Capsule Oral , Not-Taking Tylenol(Acetaminophen) 325 MG Tablet 1 tablet as needed Orally every 4 hrs , Not-Taking Rizatriptan Benzoate 10 MG Tablet Disintegrating 1 tablet Orally Onset of migraine,may repeat x 1 in 1 hr. as needed. NOT FOR DAILY USE , Not-Taking Esomeprazole Magnesium 20 MG Capsule Delayed Release 1 capsule Orally Once a day , Not-Taking Mounjaro(Tirzepatide) 5 MG/0.5ML Solution Pen-injector INJECT 0.5 ML UNDER THE SKIN ONCE WEEKLY , Not-Taking Mounjaro(Tirzepatide) 2.5 MG/0.5ML Solution Pen-injector as directed Subcutaneous , Not-Taking hydrOXYzine HCl 25 MG Tablet 1 tablet as needed Orally at bedtime , Not-Taking Mirena (52 MG)(Levonorgestrel) 20 MCG/24HR Intrauterine Device as directed Intrauterine , Not-Taking metFORMIN HCl 500 MG Tablet 1 tablet with a meal Orally Once a day , Not-Taking Vitamin D3 1.25 MG (50000 UT) Capsule 1 capsule Orally once a week , Medication List reviewed and reconciled with the patientAllergies: Aspirin 325 mg: vomiting - Allergy.  Objective: Vitals: Wt: 342.6, Wt change:  3.2 lbs, Ht: 66.0, BMI: 55.29, Temp: 97.5, Pulse sitting: 77, BP sitting: 125/78.  Examination:    Gastroenterology  Exam:        GENERAL APPEARANCE: Pleasant, morbidly obese, no acute distress.         RESPIRATORY Breath sounds normal. Respiration even and unlabored.         CARDIOVASCULAR RRR w/o murmurs or gallops. No peripheral edema.  ABDOMEN Soft, obese, nontender. No masses palpated. Liver and spleen not palpated, normal. Bowel sounds normal, Abdomen not distended.        EXTREMITIES: No edema, pulses intact.         SKIN Warm and dry, good turgor without rashes.         PSYCHIATRIC Alert and oriented x3, mood and affect appear normal..        NEURO: alert, normal strength and tone.       Assessment: 1. Hepatic steatosis - K76.0 (Primary)  2. Gastroesophageal reflux disease, unspecified whether esophagitis present - K21.9  3. Constipation, unspecified constipation type - K59.00  4. Generalized abdominal pain -   Plan: Treatment: 1. Hepatic steatosis Clinical Notes: FIB-4 score 0.67, advanced fibrosis excluded.   2. Gastroesophageal reflux disease, unspecified whether esophagitis present Clinical Notes: Recommend daily use of Nexium, up to twice a day. Continue plan for EGD.   3. Constipation, unspecified constipation type Clinical Notes: Recommend daily use of MiraLAX. Continue plan for colonoscopy.   4. Generalized abdominal pain Clinical Notes: Recommend daily use of MiraLAX. Continue plan for colonoscopy.

## 2022-02-03 NOTE — Anesthesia Postprocedure Evaluation (Signed)
Anesthesia Post Note  Patient: Janice Wolf  Procedure(s) Performed: COLONOSCOPY WITH PROPOFOL ESOPHAGOGASTRODUODENOSCOPY (EGD) BIOPSY     Patient location during evaluation: PACU Anesthesia Type: MAC Level of consciousness: awake and alert Pain management: pain level controlled Vital Signs Assessment: post-procedure vital signs reviewed and stable Respiratory status: spontaneous breathing, nonlabored ventilation and respiratory function stable Cardiovascular status: stable and blood pressure returned to baseline Anesthetic complications: no   No notable events documented.  Last Vitals:  Vitals:   02/03/22 1420 02/03/22 1430  BP: 106/67 112/69  Pulse: 64 66  Resp: 18 10  Temp:    SpO2: 94% 95%    Last Pain:  Vitals:   02/03/22 1430  TempSrc:   PainSc: 0-No pain                 Audry Pili

## 2022-02-04 LAB — SURGICAL PATHOLOGY

## 2022-02-08 ENCOUNTER — Encounter (HOSPITAL_COMMUNITY): Payer: Self-pay | Admitting: Gastroenterology

## 2022-03-09 ENCOUNTER — Encounter: Payer: Self-pay | Admitting: Skilled Nursing Facility1

## 2022-03-09 ENCOUNTER — Encounter: Payer: 59 | Attending: General Surgery | Admitting: Skilled Nursing Facility1

## 2022-03-09 VITALS — Wt 348.8 lb

## 2022-03-09 DIAGNOSIS — E669 Obesity, unspecified: Secondary | ICD-10-CM

## 2022-03-09 NOTE — Progress Notes (Signed)
Pre-Operative Nutrition Class:    Patient was seen on 03/09/2022 for Pre-Operative Bariatric Surgery Education at the [facility].    Surgery date: 03/31/2022 Surgery type: RYGB Start weight at [facility]: 347.2 Weight today: 348.8  Samples given per MNT protocol. Patient educated on appropriate usage:  [place] Lot # [id number] Exp: 9/26   Bariatric Advantage Calcium  Lot # [id number] Exp:03/24/2022   Ensure Max Protein Shake Lot # [id number] Exp: [id number]   The following the learning objectives were met by the patient during this course: Identify Pre-Op Dietary Goals and will begin 2 weeks pre-operatively Identify appropriate sources of fluids and proteins  State protein recommendations and appropriate sources pre and post-operatively Identify Post-Operative Dietary Goals and will follow for 2 weeks post-operatively Identify appropriate multivitamin and calcium sources Describe the need for physical activity post-operatively and will follow MD recommendations State when to call healthcare provider regarding medication questions or post-operative complications When having a diagnosis of diabetes understanding hypoglycemia symptoms and the inclusion of 1 complex carbohydrate per meal  Handouts given during class include: Pre-Op Bariatric Surgery Diet Handout Protein Shake Handout Post-Op Bariatric Surgery Nutrition Handout BELT Program Information Flyer Support Group Information Flyer [facility] Bariatric Supplements Price List  Follow-Up Plan: Patient will follow-up at [facility] 2 weeks post operatively for diet advancement per MD.

## 2022-03-12 NOTE — Progress Notes (Signed)
Sent message, via epic in basket, requesting orders in epic from surgeon.  

## 2022-03-17 ENCOUNTER — Ambulatory Visit: Payer: Self-pay | Admitting: General Surgery

## 2022-03-18 NOTE — Patient Instructions (Signed)
SURGICAL WAITING ROOM VISITATION Patients having surgery or a procedure may have no more than 2 support people in the waiting area - these visitors may rotate in the visitor waiting room.   Children under the age of 28 must have an adult with them who is not the patient. If the patient needs to stay at the hospital during part of their recovery, the visitor guidelines for inpatient rooms apply.  PRE-OP VISITATION  Pre-op nurse will coordinate an appropriate time for 1 support person to accompany the patient in pre-op.  This support person may not rotate.  This visitor will be contacted when the time is appropriate for the visitor to come back in the pre-op area.  Please refer to the [facility] website for the visitor guidelines for Inpatients (after your surgery is over and you are in a regular room).  You are not required to quarantine at this time prior to your surgery. However, you must do this: Hand Hygiene often Do NOT share personal items Notify your provider if you are in close contact with someone who has COVID or you develop fever 100.4 or greater, new onset of sneezing, cough, sore throat, shortness of breath or body aches.  If you test positive for Covid or have been in contact with anyone that has tested positive in the last 10 days please notify you surgeon.    Your procedure is scheduled on:  Tuesday  March 31, 2022  Report to [facility] Main Entrance: [place] entrance where the [employer] is available.   Report to admitting at: 10:15  AM  +++++Call this number if you have any questions or problems the morning of surgery [phone]  Do not eat food after Midnight the night prior to your surgery/procedure.  After Midnight you may have the following liquids until 09:30 AM DAY OF SURGERY  Clear Liquid Diet Water Black Coffee (sugar ok, NO MILK/CREAM OR CREAMERS)  Tea (sugar ok, NO MILK/CREAM OR CREAMERS) regular and decaf                              Plain Jell-O  with no fruit (NO RED)                                           Fruit ices (not with fruit pulp, NO RED)                                     Popsicles (NO RED)                                                                  Juice: apple, WHITE grape, WHITE cranberry Sports drinks like Gatorade or Powerade (NO RED)                    The day of surgery:  Drink ONE (1) Pre-Surgery G2 at 09:30 AM the morning of surgery. Drink in one sitting. Do not sip.  This drink was given to you during your hospital  pre-op appointment visit. Nothing else to drink after completing the Pre-Surgery  G2 : No candy, chewing gum or throat lozenges.    FOLLOW  ANY ADDITIONAL PRE OP INSTRUCTIONS YOU RECEIVED FROM YOUR SURGEON'S OFFICE!!!   Oral Hygiene is also important to reduce your risk of infection.        Remember - BRUSH YOUR TEETH THE MORNING OF SURGERY WITH YOUR REGULAR TOOTHPASTE  Take ONLY these medicines the morning of surgery with A SIP OF WATER: Gabapentin, Bupropion (Wellbutrin), levothyroxine (Synthroid), esomeprazole (Nexium)   If You have been diagnosed with Sleep Apnea - Bring CPAP mask and tubing day of surgery. We will provide you with a CPAP machine on the day of your surgery.                   You may not have any metal on your body including hair pins, jewelry, and body piercing  Do not wear make-up, lotions, powders, perfumes or deodorant  Do not wear nail polish including gel and S&S, artificial / acrylic nails, or any other type of covering on natural nails including finger and toenails. If you have artificial nails, gel coating, etc., that needs to be removed by a nail salon, Please have this removed prior to surgery. Not doing so may mean that your surgery could be cancelled or delayed if the Surgeon or anesthesia staff feels like they are unable to monitor you safely.   Do not shave 48 hours prior to surgery to avoid nicks in your skin which may contribute to  postoperative infections.   You may bring a small overnight bag with you on the day of surgery, only pack items that are not valuable .[facility] IS NOT RESPONSIBLE   FOR VALUABLES THAT ARE LOST OR STOLEN.   Do not bring your home medications to the hospital. The Pharmacy will dispense medications listed on your medication list to you during your admission in the Hospital.  Please read over the following fact sheets you were given: IF YOU HAVE QUESTIONS ABOUT YOUR PRE-OP INSTRUCTIONS, PLEASE CALL [phone]  ([place])   [place] - Preparing for Surgery Before surgery, you can play an important role.  Because skin is not sterile, your skin needs to be as free of germs as possible.  You can reduce the number of germs on your skin by washing with CHG (chlorahexidine gluconate) soap before surgery.  CHG is an antiseptic cleaner which kills germs and bonds with the skin to continue killing germs even after washing. Please DO NOT use if you have an allergy to CHG or antibacterial soaps.  If your skin becomes reddened/irritated stop using the CHG and inform your nurse when you arrive at Short Stay. Do not shave (including legs and underarms) for at least 48 hours prior to the first CHG shower.  You may shave your face/neck.  Please follow these instructions carefully:  1.  Shower with CHG Soap the night before surgery and the  morning of surgery.  2.  If you choose to wash your hair, wash your hair first as usual with your normal  shampoo.  3.  After you shampoo, rinse your hair and body thoroughly to remove the shampoo.                             4.  Use CHG as you would any other liquid soap.  You can apply chg directly to the skin  and wash.  Gently with a scrungie or clean washcloth.  5.  Apply the CHG Soap to your body ONLY FROM THE NECK DOWN.   Do not use on face/ open                           Wound or open sores. Avoid contact with eyes, ears mouth and genitals (private parts).                        Wash face,  Genitals (private parts) with your normal soap.             6.  Wash thoroughly, paying special attention to the area where your  surgery  will be performed.  7.  Thoroughly rinse your body with warm water from the neck down.  8.  DO NOT shower/wash with your normal soap after using and rinsing off the CHG Soap.            9.  Pat yourself dry with a clean towel.            10.  Wear clean pajamas.            11.  Place clean sheets on your bed the night of your first shower and do not  sleep with pets.  ON THE DAY OF SURGERY : Do not apply any lotions/deodorants the morning of surgery.  Please wear clean clothes to the hospital/surgery center.    FAILURE TO FOLLOW THESE INSTRUCTIONS MAY RESULT IN THE CANCELLATION OF YOUR SURGERY  PATIENT SIGNATURE_________________________________  NURSE SIGNATURE__________________________________  ________________________________________________________________________  Adam Phenix    An incentive spirometer is a tool that can help keep your lungs clear and active. This tool measures how well you are filling your lungs with each breath. Taking long deep breaths may help reverse or decrease the chance of developing breathing (pulmonary) problems (especially infection) following: A long period of time when you are unable to move or be active. BEFORE THE PROCEDURE  If the spirometer includes an indicator to show your best effort, your nurse or respiratory therapist will set it to a desired goal. If possible, sit up straight or lean slightly forward. Try not to slouch. Hold the incentive spirometer in an upright position. INSTRUCTIONS FOR USE  Sit on the edge of your bed if possible, or sit up as far as you can in bed or on a chair. Hold the incentive spirometer in an upright position. Breathe out normally. Place the mouthpiece in your mouth and seal your lips tightly around it. Breathe in slowly and as deeply as possible,  raising the piston or the ball toward the top of the column. Hold your breath for 3-5 seconds or for as long as possible. Allow the piston or ball to fall to the bottom of the column. Remove the mouthpiece from your mouth and breathe out normally. Rest for a few seconds and repeat Steps 1 through 7 at least 10 times every 1-2 hours when you are awake. Take your time and take a few normal breaths between deep breaths. The spirometer may include an indicator to show your best effort. Use the indicator as a goal to work toward during each repetition. After each set of 10 deep breaths, practice coughing to be sure your lungs are clear. If you have an incision (the cut made at the time of surgery), support your incision when coughing by placing  a pillow or rolled up towels firmly against it. Once you are able to get out of bed, walk around indoors and cough well. You may stop using the incentive spirometer when instructed by your caregiver.  RISKS AND COMPLICATIONS Take your time so you do not get dizzy or light-headed. If you are in pain, you may need to take or ask for pain medication before doing incentive spirometry. It is harder to take a deep breath if you are having pain. AFTER USE Rest and breathe slowly and easily. It can be helpful to keep track of a log of your progress. Your caregiver can provide you with a simple table to help with this. If you are using the spirometer at home, follow these instructions: [place] IF:  You are having difficultly using the spirometer. You have trouble using the spirometer as often as instructed. Your pain medication is not giving enough relief while using the spirometer. You develop fever of 100.5 F (38.1 C) or higher.                                                                                                    SEEK IMMEDIATE MEDICAL CARE IF:  You cough up bloody sputum that had not been present before. You develop fever of 102 F (38.9 C)  or greater. You develop worsening pain at or near the incision site. MAKE SURE YOU:  Understand these instructions. Will watch your condition. Will get help right away if you are not doing well or get worse. Document Released: 08/31/2006 Document Revised: 07/13/2011 Document Reviewed: 11/01/2006 [facility] Patient Information 2014 [place], [place].

## 2022-03-18 NOTE — Progress Notes (Signed)
COVID Vaccine received:  _0  No _1  Yes Date of any COVID positive Test in last 90 days:  PCP - Jonathon Jordan, MD Cardiologist -   Chest x-ray - 01-14-2022 EKG -   Stress Test -  ECHO -  Cardiac Cath -   PCR screen: _2  Ordered & Completed                      _3   No Order but Needs PROFEND                      _4   N/A for this surgery  Surgery Plan:  _5  Ambulatory                            _6  Outpatient in bed                            _7  Admit  Anesthesia:    _8  General  _9  Spinal                           _10   Choice _11   MAC  Bowel Prep - _12  No  _13   Yes _____________  Pacemaker / ICD device _14  No _15  Yes        Device order form faxed _16  No    _17   Yes      Faxed to:  Spinal Cord Stimulator:_18  No _19  Yes      (Remind patient to bring remote DOS) Other Implants:   History of Sleep Apnea? _20  No _21  Yes   CPAP used?- _22  No _23  Yes   Sent form to Respiratory for CPAP machine.   Does the patient monitor blood sugar? _24  No _25  Yes  _26  N/A Does patient have a [employer] or Dexacom? _27  No _28  Yes   Fasting Blood Sugar Ranges-  Checks Blood Sugar _____ times a day  Last dose of GLP1 agonist-  GLP1 instructions:  Last dose of SGLT-2 inhibitors-  SGLT-2 instructions:   Blood Thinner / Instructions: None Aspirin Instructions:  ERAS Protocol Ordered: _29  No  _30  Yes PRE-SURGERY _31  ENSURE  _32  G2  _33  No Drink Ordered  Patient is to be NPO after:  09:30   Comments: Has a MIRENA IUD.  Activity level: Patient can not climb a flight of stairs without difficulty, She would have ______   Patient can / can not perform ADLs without assistance.   Anesthesia review: HTN, PONV, "hard to wake up", OSA (CPAP), fatty liver.   Patient denies shortness of breath, fever, cough and chest pain at PAT appointment.  Patient verbalized understanding and agreement to the Pre-Surgical Instructions that were given to them at this PAT appointment. Patient was also educated of the need to  review these PAT instructions again prior to his/her surgery.I reviewed the appropriate phone numbers to call if they have any and questions or concerns.

## 2022-03-19 ENCOUNTER — Other Ambulatory Visit: Payer: Self-pay

## 2022-03-19 ENCOUNTER — Encounter (HOSPITAL_COMMUNITY)
Admission: RE | Admit: 2022-03-19 | Discharge: 2022-03-19 | Disposition: A | Discharge status: Home / Self Care | Payer: 59 | Source: Ambulatory Visit | Attending: General Surgery | Admitting: General Surgery

## 2022-03-19 ENCOUNTER — Encounter (HOSPITAL_COMMUNITY): Payer: Self-pay

## 2022-03-19 ENCOUNTER — Ambulatory Visit: Payer: Self-pay | Admitting: General Surgery

## 2022-03-19 VITALS — BP 101/70 | HR 84 | Temp 98.4°F | Resp 20 | Ht 67.5 in | Wt 348.0 lb

## 2022-03-19 DIAGNOSIS — K7469 Other cirrhosis of liver: Secondary | ICD-10-CM | POA: Diagnosis not present

## 2022-03-19 DIAGNOSIS — Z01818 Encounter for other preprocedural examination: Secondary | ICD-10-CM | POA: Insufficient documentation

## 2022-03-19 HISTORY — DX: Pneumonia, unspecified organism: J18.9

## 2022-03-19 HISTORY — DX: Unspecified osteoarthritis, unspecified site: M19.90

## 2022-03-19 HISTORY — DX: Myoneural disorder, unspecified: G70.9

## 2022-03-19 LAB — CBC WITH DIFFERENTIAL/PLATELET
Abs Immature Granulocytes: 0.05 10*3/uL (ref 0.00–0.07)
Basophils Absolute: 0.1 10*3/uL (ref 0.0–0.1)
Basophils Relative: 1 %
Eosinophils Absolute: 0.4 10*3/uL (ref 0.0–0.5)
Eosinophils Relative: 4 %
HCT: 41.8 % (ref 36.0–46.0)
Hemoglobin: 13.8 g/dL (ref 12.0–15.0)
Immature Granulocytes: 1 %
Lymphocytes Relative: 34 %
Lymphs Abs: 3.4 10*3/uL (ref 0.7–4.0)
MCH: 32.1 pg (ref 26.0–34.0)
MCHC: 33 g/dL (ref 30.0–36.0)
MCV: 97.2 fL (ref 80.0–100.0)
Monocytes Absolute: 0.5 10*3/uL (ref 0.1–1.0)
Monocytes Relative: 5 %
Neutro Abs: 5.6 10*3/uL (ref 1.7–7.7)
Neutrophils Relative %: 55 %
Platelets: 240 10*3/uL (ref 150–400)
RBC: 4.3 MIL/uL (ref 3.87–5.11)
RDW: 13.5 % (ref 11.5–15.5)
WBC: 10 10*3/uL (ref 4.0–10.5)
nRBC: 0 % (ref 0.0–0.2)

## 2022-03-19 LAB — COMPREHENSIVE METABOLIC PANEL
ALT: 29 U/L (ref 0–44)
AST: 36 U/L (ref 15–41)
Albumin: 3.7 g/dL (ref 3.5–5.0)
Alkaline Phosphatase: 72 U/L (ref 38–126)
Anion gap: 8 (ref 5–15)
BUN: 16 mg/dL (ref 6–20)
CO2: 24 mmol/L (ref 22–32)
Calcium: 9.3 mg/dL (ref 8.9–10.3)
Chloride: 105 mmol/L (ref 98–111)
Creatinine, Ser: 0.86 mg/dL (ref 0.44–1.00)
GFR, Estimated: >60 mL/min (ref >60)
Glucose, Bld: 139 mg/dL — ABNORMAL HIGH (ref 70–99)
Potassium: 4.2 mmol/L (ref 3.5–5.1)
Sodium: 137 mmol/L (ref 135–145)
Total Bilirubin: 0.7 mg/dL (ref 0.3–1.2)
Total Protein: 7.6 g/dL (ref 6.5–8.1)

## 2022-03-19 LAB — TYPE AND SCREEN
ABO/RH(D): AB POS
Antibody Screen: NEGATIVE

## 2022-03-19 LAB — PROTIME-INR
INR: 1.1 (ref 0.8–1.2)
Prothrombin Time: 13.7 seconds (ref 11.4–15.2)

## 2022-03-31 ENCOUNTER — Ambulatory Visit (HOSPITAL_COMMUNITY): Payer: 59 | Admitting: Certified Registered"

## 2022-03-31 ENCOUNTER — Ambulatory Visit (HOSPITAL_BASED_OUTPATIENT_CLINIC_OR_DEPARTMENT_OTHER): Payer: 59 | Admitting: Certified Registered"

## 2022-03-31 ENCOUNTER — Other Ambulatory Visit: Payer: Self-pay

## 2022-03-31 ENCOUNTER — Encounter (HOSPITAL_COMMUNITY): Payer: Self-pay | Admitting: General Surgery

## 2022-03-31 ENCOUNTER — Observation Stay (HOSPITAL_COMMUNITY)
Admission: RE | Admit: 2022-03-31 | Discharge: 2022-04-02 | Disposition: A | Discharge status: Home / Self Care | Payer: 59 | Attending: General Surgery | Admitting: General Surgery

## 2022-03-31 ENCOUNTER — Encounter (HOSPITAL_COMMUNITY)
Admission: RE | Disposition: A | Discharge status: Home / Self Care | Payer: Self-pay | Source: Home / Self Care | Attending: General Surgery

## 2022-03-31 DIAGNOSIS — K760 Fatty (change of) liver, not elsewhere classified: Secondary | ICD-10-CM | POA: Diagnosis not present

## 2022-03-31 DIAGNOSIS — Z7984 Long term (current) use of oral hypoglycemic drugs: Secondary | ICD-10-CM | POA: Insufficient documentation

## 2022-03-31 DIAGNOSIS — R160 Hepatomegaly, not elsewhere classified: Secondary | ICD-10-CM | POA: Diagnosis not present

## 2022-03-31 DIAGNOSIS — Z01818 Encounter for other preprocedural examination: Secondary | ICD-10-CM

## 2022-03-31 DIAGNOSIS — Z6841 Body Mass Index (BMI) 40.0 and over, adult: Secondary | ICD-10-CM

## 2022-03-31 DIAGNOSIS — Z79899 Other long term (current) drug therapy: Secondary | ICD-10-CM | POA: Diagnosis not present

## 2022-03-31 DIAGNOSIS — G8929 Other chronic pain: Secondary | ICD-10-CM | POA: Diagnosis not present

## 2022-03-31 DIAGNOSIS — M545 Low back pain, unspecified: Secondary | ICD-10-CM | POA: Insufficient documentation

## 2022-03-31 DIAGNOSIS — G4733 Obstructive sleep apnea (adult) (pediatric): Secondary | ICD-10-CM | POA: Diagnosis not present

## 2022-03-31 DIAGNOSIS — R7303 Prediabetes: Secondary | ICD-10-CM | POA: Insufficient documentation

## 2022-03-31 DIAGNOSIS — F418 Other specified anxiety disorders: Secondary | ICD-10-CM | POA: Diagnosis not present

## 2022-03-31 DIAGNOSIS — Z9989 Dependence on other enabling machines and devices: Secondary | ICD-10-CM

## 2022-03-31 DIAGNOSIS — Z9884 Bariatric surgery status: Secondary | ICD-10-CM

## 2022-03-31 HISTORY — PX: GASTRIC ROUX-EN-Y: SHX5262

## 2022-03-31 LAB — POCT PREGNANCY, URINE: Preg Test, Ur: NEGATIVE

## 2022-03-31 LAB — CBC
HCT: 44.6 % (ref 36.0–46.0)
Hemoglobin: 14.7 g/dL (ref 12.0–15.0)
MCH: 32.3 pg (ref 26.0–34.0)
MCHC: 33 g/dL (ref 30.0–36.0)
MCV: 98 fL (ref 80.0–100.0)
Platelets: 247 10*3/uL (ref 150–400)
RBC: 4.55 MIL/uL (ref 3.87–5.11)
RDW: 13.4 % (ref 11.5–15.5)
WBC: 13.7 10*3/uL — ABNORMAL HIGH (ref 4.0–10.5)
nRBC: 0 % (ref 0.0–0.2)

## 2022-03-31 LAB — CREATININE, SERUM
Creatinine, Ser: 0.92 mg/dL (ref 0.44–1.00)
GFR, Estimated: >60 mL/min (ref >60)

## 2022-03-31 LAB — ABO/RH: ABO/RH(D): AB POS

## 2022-03-31 SURGERY — LAPAROSCOPIC ROUX-EN-Y GASTRIC BYPASS WITH UPPER ENDOSCOPY
Anesthesia: General

## 2022-03-31 MED ORDER — FIBRIN SEALANT 2 ML SINGLE DOSE KIT
2.0000 mL | PACK | Freq: Once | CUTANEOUS | Status: AC
  Administered 2022-03-31: 2 mL via TOPICAL
  Filled 2022-03-31: qty 2

## 2022-03-31 MED ORDER — HEPARIN SODIUM (PORCINE) 5000 UNIT/ML IJ SOLN
5000.0000 [IU] | INTRAMUSCULAR | Status: AC
  Administered 2022-03-31: 5000 [IU] via SUBCUTANEOUS
  Filled 2022-03-31: qty 1

## 2022-03-31 MED ORDER — HEPARIN SODIUM (PORCINE) 5000 UNIT/ML IJ SOLN
5000.0000 [IU] | Freq: Three times a day (TID) | INTRAMUSCULAR | Status: DC
  Administered 2022-03-31 – 2022-04-02 (×5): 5000 [IU] via SUBCUTANEOUS
  Filled 2022-03-31 (×5): qty 1

## 2022-03-31 MED ORDER — APREPITANT 40 MG PO CAPS
40.0000 mg | ORAL_CAPSULE | ORAL | Status: AC
  Administered 2022-03-31: 40 mg via ORAL
  Filled 2022-03-31: qty 1

## 2022-03-31 MED ORDER — DEXAMETHASONE SODIUM PHOSPHATE 10 MG/ML IJ SOLN
INTRAMUSCULAR | Status: AC
  Filled 2022-03-31: qty 1

## 2022-03-31 MED ORDER — PRAZOSIN HCL 1 MG PO CAPS: 2.0000 mg | ORAL_CAPSULE | Freq: Every evening | ORAL | Status: DC | PRN

## 2022-03-31 MED ORDER — PROCHLORPERAZINE EDISYLATE 10 MG/2ML IJ SOLN: 10.0000 mg | Freq: Four times a day (QID) | INTRAMUSCULAR | Status: DC | PRN

## 2022-03-31 MED ORDER — LACTATED RINGERS IV SOLN: INTRAVENOUS | Status: DC

## 2022-03-31 MED ORDER — KETAMINE HCL 10 MG/ML IJ SOLN
INTRAMUSCULAR | Status: DC | PRN
  Administered 2022-03-31: 50 mg via INTRAVENOUS

## 2022-03-31 MED ORDER — SIMETHICONE 80 MG PO CHEW
80.0000 mg | CHEWABLE_TABLET | Freq: Four times a day (QID) | ORAL | Status: DC | PRN
  Administered 2022-03-31 – 2022-04-02 (×5): 80 mg via ORAL
  Filled 2022-03-31 (×5): qty 1

## 2022-03-31 MED ORDER — HYDRALAZINE HCL 20 MG/ML IJ SOLN
INTRAMUSCULAR | Status: AC
  Filled 2022-03-31: qty 1

## 2022-03-31 MED ORDER — GABAPENTIN 100 MG PO CAPS
300.0000 mg | ORAL_CAPSULE | Freq: Two times a day (BID) | ORAL | Status: DC
  Administered 2022-03-31 – 2022-04-02 (×4): 300 mg via ORAL
  Filled 2022-03-31 (×4): qty 3

## 2022-03-31 MED ORDER — HYDROMORPHONE HCL 1 MG/ML IJ SOLN
0.2500 mg | INTRAMUSCULAR | Status: DC | PRN
  Administered 2022-03-31 (×2): 0.5 mg via INTRAVENOUS

## 2022-03-31 MED ORDER — HYDROMORPHONE HCL 1 MG/ML IJ SOLN
INTRAMUSCULAR | Status: AC
  Administered 2022-03-31: 0.5 mg via INTRAVENOUS
  Filled 2022-03-31: qty 1

## 2022-03-31 MED ORDER — CHLORHEXIDINE GLUCONATE 4 % EX LIQD: Freq: Once | CUTANEOUS | Status: DC

## 2022-03-31 MED ORDER — PROPOFOL 10 MG/ML IV BOLUS
INTRAVENOUS | Status: DC | PRN
  Administered 2022-03-31: 200 mg via INTRAVENOUS

## 2022-03-31 MED ORDER — ONDANSETRON HCL 4 MG/2ML IJ SOLN
INTRAMUSCULAR | Status: AC
  Filled 2022-03-31: qty 2

## 2022-03-31 MED ORDER — ROCURONIUM BROMIDE 10 MG/ML (PF) SYRINGE
PREFILLED_SYRINGE | INTRAVENOUS | Status: AC
  Filled 2022-03-31: qty 20

## 2022-03-31 MED ORDER — ENSURE MAX PROTEIN PO LIQD
2.0000 [oz_av] | ORAL | Status: DC
  Administered 2022-04-01 – 2022-04-02 (×10): 2 [oz_av] via ORAL

## 2022-03-31 MED ORDER — BUPIVACAINE HCL (PF) 0.25 % IJ SOLN
INTRAMUSCULAR | Status: DC | PRN
  Administered 2022-03-31: 30 mL

## 2022-03-31 MED ORDER — ONDANSETRON HCL 4 MG/2ML IJ SOLN
4.0000 mg | Freq: Four times a day (QID) | INTRAMUSCULAR | Status: DC | PRN
  Administered 2022-03-31 – 2022-04-01 (×2): 4 mg via INTRAVENOUS
  Filled 2022-03-31: qty 2

## 2022-03-31 MED ORDER — SODIUM CHLORIDE 0.9 % IV SOLN
2.0000 g | INTRAVENOUS | Status: AC
  Administered 2022-03-31: 2 g via INTRAVENOUS
  Filled 2022-03-31: qty 2

## 2022-03-31 MED ORDER — SUGAMMADEX SODIUM 200 MG/2ML IV SOLN
INTRAVENOUS | Status: DC | PRN
  Administered 2022-03-31: 350 mg via INTRAVENOUS

## 2022-03-31 MED ORDER — SCOPOLAMINE 1 MG/3DAYS TD PT72
1.0000 | MEDICATED_PATCH | TRANSDERMAL | Status: DC
  Administered 2022-03-31: 1.5 mg via TRANSDERMAL
  Filled 2022-03-31: qty 1

## 2022-03-31 MED ORDER — LEVOTHYROXINE SODIUM 100 MCG PO TABS
100.0000 ug | ORAL_TABLET | Freq: Every day | ORAL | Status: DC
  Administered 2022-04-01 – 2022-04-02 (×2): 100 ug via ORAL
  Filled 2022-03-31 (×2): qty 1

## 2022-03-31 MED ORDER — HYDROMORPHONE HCL 1 MG/ML IJ SOLN
INTRAMUSCULAR | Status: AC
  Filled 2022-03-31: qty 1

## 2022-03-31 MED ORDER — PRAZOSIN HCL 1 MG PO CAPS
2.0000 mg | ORAL_CAPSULE | Freq: Every day | ORAL | Status: DC
  Administered 2022-04-01 – 2022-04-02 (×2): 2 mg via ORAL
  Filled 2022-03-31 (×2): qty 2

## 2022-03-31 MED ORDER — CHLORHEXIDINE GLUCONATE 0.12 % MT SOLN: 15.0000 mL | Freq: Once | OROMUCOSAL | Status: DC

## 2022-03-31 MED ORDER — ORAL CARE MOUTH RINSE: 15.0000 mL | OROMUCOSAL | Status: DC | PRN

## 2022-03-31 MED ORDER — ONDANSETRON HCL 4 MG/2ML IJ SOLN
INTRAMUSCULAR | Status: DC | PRN
  Administered 2022-03-31: 4 mg via INTRAVENOUS

## 2022-03-31 MED ORDER — PRAZOSIN HCL 1 MG PO CAPS: 2.0000 mg | ORAL_CAPSULE | Freq: Three times a day (TID) | ORAL | Status: DC | PRN

## 2022-03-31 MED ORDER — EPHEDRINE 5 MG/ML INJ
INTRAVENOUS | Status: AC
  Filled 2022-03-31: qty 5

## 2022-03-31 MED ORDER — LACTATED RINGERS IR SOLN
Status: DC | PRN
  Administered 2022-03-31: 1000 mL

## 2022-03-31 MED ORDER — BUPIVACAINE HCL (PF) 0.25 % IJ SOLN
INTRAMUSCULAR | Status: AC
  Filled 2022-03-31: qty 30

## 2022-03-31 MED ORDER — HYDRALAZINE HCL 20 MG/ML IJ SOLN
5.0000 mg | Freq: Once | INTRAMUSCULAR | Status: AC
  Administered 2022-03-31: 5 mg via INTRAVENOUS

## 2022-03-31 MED ORDER — BUPIVACAINE LIPOSOME 1.3 % IJ SUSP
INTRAMUSCULAR | Status: DC | PRN
  Administered 2022-03-31: 20 mL

## 2022-03-31 MED ORDER — BUPIVACAINE LIPOSOME 1.3 % IJ SUSP: 20.0000 mL | Freq: Once | INTRAMUSCULAR | Status: DC

## 2022-03-31 MED ORDER — FENTANYL CITRATE (PF) 100 MCG/2ML IJ SOLN
INTRAMUSCULAR | Status: DC | PRN
  Administered 2022-03-31: 100 ug via INTRAVENOUS
  Administered 2022-03-31: 50 ug via INTRAVENOUS

## 2022-03-31 MED ORDER — STERILE WATER FOR IRRIGATION IR SOLN
Status: DC | PRN
  Administered 2022-03-31: 1000 mL

## 2022-03-31 MED ORDER — PANTOPRAZOLE SODIUM 40 MG IV SOLR
40.0000 mg | Freq: Every day | INTRAVENOUS | Status: DC
  Administered 2022-03-31 – 2022-04-01 (×2): 40 mg via INTRAVENOUS
  Filled 2022-03-31 (×2): qty 10

## 2022-03-31 MED ORDER — FENTANYL CITRATE (PF) 250 MCG/5ML IJ SOLN
INTRAMUSCULAR | Status: AC
  Filled 2022-03-31: qty 5

## 2022-03-31 MED ORDER — MORPHINE SULFATE (PF) 2 MG/ML IV SOLN: 1.0000 mg | INTRAVENOUS | Status: DC | PRN

## 2022-03-31 MED ORDER — LIDOCAINE 2% (20 MG/ML) 5 ML SYRINGE
INTRAMUSCULAR | Status: DC | PRN
  Administered 2022-03-31: 1.5 mg/kg/h via INTRAVENOUS
  Administered 2022-03-31: 100 mg via INTRAVENOUS

## 2022-03-31 MED ORDER — ORAL CARE MOUTH RINSE: 15.0000 mL | Freq: Once | OROMUCOSAL | Status: DC

## 2022-03-31 MED ORDER — DEXAMETHASONE SODIUM PHOSPHATE 10 MG/ML IJ SOLN
INTRAMUSCULAR | Status: DC | PRN
  Administered 2022-03-31: 10 mg via INTRAVENOUS

## 2022-03-31 MED ORDER — CYCLOBENZAPRINE HCL 10 MG PO TABS
10.0000 mg | ORAL_TABLET | Freq: Three times a day (TID) | ORAL | Status: DC | PRN
  Administered 2022-04-01: 10 mg via ORAL
  Filled 2022-03-31: qty 1

## 2022-03-31 MED ORDER — ROCURONIUM BROMIDE 10 MG/ML (PF) SYRINGE
PREFILLED_SYRINGE | INTRAVENOUS | Status: AC
  Filled 2022-03-31: qty 10

## 2022-03-31 MED ORDER — "VISTASEAL 4 ML SINGLE DOSE KIT "
4.0000 mL | PACK | Freq: Once | CUTANEOUS | Status: AC
  Administered 2022-03-31: 4 mL via TOPICAL
  Filled 2022-03-31: qty 4

## 2022-03-31 MED ORDER — SUCCINYLCHOLINE CHLORIDE 200 MG/10ML IV SOSY
PREFILLED_SYRINGE | INTRAVENOUS | Status: DC | PRN
  Administered 2022-03-31: 140 mg via INTRAVENOUS

## 2022-03-31 MED ORDER — ROCURONIUM BROMIDE 10 MG/ML (PF) SYRINGE
PREFILLED_SYRINGE | INTRAVENOUS | Status: DC | PRN
  Administered 2022-03-31: 20 mg via INTRAVENOUS
  Administered 2022-03-31: 10 mg via INTRAVENOUS
  Administered 2022-03-31: 20 mg via INTRAVENOUS
  Administered 2022-03-31: 100 mg via INTRAVENOUS

## 2022-03-31 MED ORDER — MIDAZOLAM HCL 2 MG/2ML IJ SOLN
INTRAMUSCULAR | Status: AC
  Filled 2022-03-31: qty 2

## 2022-03-31 MED ORDER — BUPIVACAINE LIPOSOME 1.3 % IJ SUSP
INTRAMUSCULAR | Status: AC
  Filled 2022-03-31: qty 20

## 2022-03-31 MED ORDER — ACETAMINOPHEN 500 MG PO TABS
1000.0000 mg | ORAL_TABLET | ORAL | Status: AC
  Administered 2022-03-31: 1000 mg via ORAL
  Filled 2022-03-31: qty 2

## 2022-03-31 MED ORDER — PHENYLEPHRINE 80 MCG/ML (10ML) SYRINGE FOR IV PUSH (FOR BLOOD PRESSURE SUPPORT)
PREFILLED_SYRINGE | INTRAVENOUS | Status: DC | PRN
  Administered 2022-03-31: 160 ug via INTRAVENOUS
  Administered 2022-03-31: 80 ug via INTRAVENOUS

## 2022-03-31 MED ORDER — DEXAMETHASONE SODIUM PHOSPHATE 4 MG/ML IJ SOLN: 4.0000 mg | INTRAMUSCULAR | Status: DC

## 2022-03-31 MED ORDER — ACETAMINOPHEN 160 MG/5ML PO SOLN: 1000.0000 mg | Freq: Three times a day (TID) | ORAL | Status: DC

## 2022-03-31 MED ORDER — MIDAZOLAM HCL 5 MG/5ML IJ SOLN
INTRAMUSCULAR | Status: DC | PRN
  Administered 2022-03-31: 2 mg via INTRAVENOUS

## 2022-03-31 MED ORDER — SUCCINYLCHOLINE CHLORIDE 200 MG/10ML IV SOSY
PREFILLED_SYRINGE | INTRAVENOUS | Status: AC
  Filled 2022-03-31: qty 10

## 2022-03-31 MED ORDER — 0.9 % SODIUM CHLORIDE (POUR BTL) OPTIME
TOPICAL | Status: DC | PRN
  Administered 2022-03-31: 1000 mL

## 2022-03-31 MED ORDER — KETAMINE HCL 50 MG/5ML IJ SOSY
PREFILLED_SYRINGE | INTRAMUSCULAR | Status: AC
  Filled 2022-03-31: qty 5

## 2022-03-31 MED ORDER — FLUTICASONE PROPIONATE 50 MCG/ACT NA SUSP: 1.0000 | Freq: Every day | NASAL | Status: DC | PRN

## 2022-03-31 MED ORDER — PROPOFOL 10 MG/ML IV BOLUS
INTRAVENOUS | Status: AC
  Filled 2022-03-31: qty 20

## 2022-03-31 MED ORDER — TRAZODONE HCL 50 MG PO TABS: 50.0000 mg | ORAL_TABLET | Freq: Every evening | ORAL | Status: DC | PRN

## 2022-03-31 MED ORDER — OXYCODONE HCL 5 MG/5ML PO SOLN
5.0000 mg | Freq: Four times a day (QID) | ORAL | Status: DC | PRN
  Administered 2022-04-01 – 2022-04-02 (×3): 5 mg via ORAL
  Filled 2022-03-31 (×3): qty 5

## 2022-03-31 MED ORDER — ACETAMINOPHEN 500 MG PO TABS
1000.0000 mg | ORAL_TABLET | Freq: Three times a day (TID) | ORAL | Status: DC
  Administered 2022-03-31 – 2022-04-02 (×5): 1000 mg via ORAL
  Filled 2022-03-31 (×5): qty 2

## 2022-03-31 MED ORDER — KCL IN DEXTROSE-NACL 20-5-0.45 MEQ/L-%-% IV SOLN
INTRAVENOUS | Status: DC
  Filled 2022-03-31 (×5): qty 1000

## 2022-03-31 MED ORDER — EPHEDRINE SULFATE-NACL 50-0.9 MG/10ML-% IV SOSY
PREFILLED_SYRINGE | INTRAVENOUS | Status: DC | PRN
  Administered 2022-03-31: 10 mg via INTRAVENOUS

## 2022-03-31 SURGICAL SUPPLY — 79 items
APPLICATOR COTTON TIP 6 STRL (MISCELLANEOUS) IMPLANT
APPLICATOR COTTON TIP 6IN STRL (MISCELLANEOUS)
APPLICATOR VISTASEAL 35 (MISCELLANEOUS) ×2 IMPLANT
APPLIER CLIP ROT 13.4 12 LRG (CLIP)
BAG COUNTER SPONGE SURGICOUNT (BAG) IMPLANT
BLADE SURG SZ11 CARB STEEL (BLADE) ×1 IMPLANT
CABLE HIGH FREQUENCY MONO STRZ (ELECTRODE) IMPLANT
CHLORAPREP W/TINT 26 (MISCELLANEOUS) ×2 IMPLANT
CLIP APPLIE ROT 13.4 12 LRG (CLIP) IMPLANT
CLIP SUT LAPRA TY ABSORB (SUTURE) ×2 IMPLANT
CUTTER FLEX LINEAR 45M (STAPLE) IMPLANT
DEVICE SUT QUICK LOAD TK 5 (SUTURE) IMPLANT
DEVICE SUT TI-KNOT TK 5X26 (SUTURE) IMPLANT
DEVICE SUTURE ENDOST 10MM (ENDOMECHANICALS) ×1 IMPLANT
DRAIN PENROSE 0.25X18 (DRAIN) ×1 IMPLANT
DRAIN PENROSE LF 8X20.3CM SIL (WOUND CARE) IMPLANT
DRSG TEGADERM 2-3/8X2-3/4 SM (GAUZE/BANDAGES/DRESSINGS) ×6 IMPLANT
ELECT REM PT RETURN 15FT ADLT (MISCELLANEOUS) ×1 IMPLANT
GAUZE 4X4 16PLY ~~LOC~~+RFID DBL (SPONGE) ×1 IMPLANT
GAUZE SPONGE 2X2 8PLY STRL LF (GAUZE/BANDAGES/DRESSINGS) ×1 IMPLANT
GAUZE SPONGE 4X4 12PLY STRL (GAUZE/BANDAGES/DRESSINGS) IMPLANT
GLOVE BIO SURGEON STRL SZ7.5 (GLOVE) ×1 IMPLANT
GLOVE INDICATOR 8.0 STRL GRN (GLOVE) ×1 IMPLANT
GOWN STRL REUS W/ TWL XL LVL3 (GOWN DISPOSABLE) ×4 IMPLANT
GOWN STRL REUS W/TWL XL LVL3 (GOWN DISPOSABLE) ×4
IRRIG SUCT STRYKERFLOW 2 WTIP (MISCELLANEOUS) ×1
IRRIGATION SUCT STRKRFLW 2 WTP (MISCELLANEOUS) ×1 IMPLANT
KIT BASIN OR (CUSTOM PROCEDURE TRAY) ×1 IMPLANT
KIT GASTRIC LAVAGE 34FR ADT (SET/KITS/TRAYS/PACK) ×1 IMPLANT
KIT TURNOVER KIT A (KITS) IMPLANT
MARKER SKIN DUAL TIP RULER LAB (MISCELLANEOUS) ×1 IMPLANT
MAT PREVALON FULL STRYKER (MISCELLANEOUS) ×1 IMPLANT
NDL SPNL 22GX3.5 QUINCKE BK (NEEDLE) ×1 IMPLANT
NEEDLE SPNL 22GX3.5 QUINCKE BK (NEEDLE) ×1 IMPLANT
PACK CARDIOVASCULAR III (CUSTOM PROCEDURE TRAY) ×1 IMPLANT
RELOAD 45 VASCULAR/THIN (ENDOMECHANICALS) IMPLANT
RELOAD ENDO STITCH 2.0 (ENDOMECHANICALS) ×12
RELOAD STAPLE 45 2.5 WHT GRN (ENDOMECHANICALS) IMPLANT
RELOAD STAPLE 45 3.5 BLU ETS (ENDOMECHANICALS) IMPLANT
RELOAD STAPLE 60 2.6 WHT THN (STAPLE) ×2 IMPLANT
RELOAD STAPLE 60 3.6 BLU REG (STAPLE) ×2 IMPLANT
RELOAD STAPLE 60 3.8 GOLD REG (STAPLE) ×1 IMPLANT
RELOAD STAPLE TA45 3.5 REG BLU (ENDOMECHANICALS) IMPLANT
RELOAD STAPLER BLUE 60MM (STAPLE) ×4 IMPLANT
RELOAD STAPLER GOLD 60MM (STAPLE) ×1 IMPLANT
RELOAD STAPLER WHITE 60MM (STAPLE) ×2 IMPLANT
RELOAD SUT SNGL STCH ABSRB 2-0 (ENDOMECHANICALS) ×5 IMPLANT
RELOAD SUT SNGL STCH BLK 2-0 (ENDOMECHANICALS) ×4 IMPLANT
SCISSORS LAP 5X45 EPIX DISP (ENDOMECHANICALS) ×1 IMPLANT
SET TUBE SMOKE EVAC HIGH FLOW (TUBING) ×1 IMPLANT
SHEARS HARMONIC ACE PLUS 45CM (MISCELLANEOUS) ×1 IMPLANT
SLEEVE ADV FIXATION 12X100MM (TROCAR) ×2 IMPLANT
SLEEVE ADV FIXATION 5X100MM (TROCAR) IMPLANT
SOL ANTI FOG 6CC (MISCELLANEOUS) ×1 IMPLANT
SOLUTION ANTI FOG 6CC (MISCELLANEOUS) ×1
STAPLE LINE REINFORCEMENT LAP (STAPLE) IMPLANT
STAPLER ECHELON BIOABSB 60 FLE (MISCELLANEOUS) IMPLANT
STAPLER ECHELON LONG 60 440 (INSTRUMENTS) ×1 IMPLANT
STAPLER RELOAD BLUE 60MM (STAPLE) ×4
STAPLER RELOAD GOLD 60MM (STAPLE) ×1
STAPLER RELOAD WHITE 60MM (STAPLE) ×2
STRIP CLOSURE SKIN 1/2X4 (GAUZE/BANDAGES/DRESSINGS) ×1 IMPLANT
SURGILUBE 2OZ TUBE FLIPTOP (MISCELLANEOUS) ×1 IMPLANT
SUT MNCRL AB 4-0 PS2 18 (SUTURE) ×1 IMPLANT
SUT RELOAD ENDO STITCH 2 48X1 (ENDOMECHANICALS) ×6
SUT RELOAD ENDO STITCH 2.0 (ENDOMECHANICALS) ×6
SUT SURGIDAC NAB ES-9 0 48 120 (SUTURE) IMPLANT
SUT VIC AB 2-0 SH 27 (SUTURE) ×1
SUT VIC AB 2-0 SH 27X BRD (SUTURE) ×1 IMPLANT
SUTURE RELOAD END STTCH 2 48X1 (ENDOMECHANICALS) ×6 IMPLANT
SUTURE RELOAD ENDO STITCH 2.0 (ENDOMECHANICALS) ×6 IMPLANT
SYR 20ML LL LF (SYRINGE) ×2 IMPLANT
TOWEL OR 17X26 10 PK STRL BLUE (TOWEL DISPOSABLE) ×1 IMPLANT
TOWEL OR NON WOVEN STRL DISP B (DISPOSABLE) ×1 IMPLANT
TRAY FOLEY MTR SLVR 16FR STAT (SET/KITS/TRAYS/PACK) IMPLANT
TROCAR ADV FIXATION 12X100MM (TROCAR) ×1 IMPLANT
TROCAR ADV FIXATION 5X100MM (TROCAR) ×1 IMPLANT
TROCAR XCEL NON-BLD 5MMX100MML (ENDOMECHANICALS) ×1 IMPLANT
TUBING CONNECTING 10 (TUBING) ×2 IMPLANT

## 2022-03-31 NOTE — Discharge Instructions (Signed)

## 2022-03-31 NOTE — Transfer of Care (Signed)
Immediate Anesthesia Transfer of Care Note  Patient: Janice Wolf  Procedure(s) Performed: LAPAROSCOPIC ROUX-EN-Y GASTRIC BYPASS WITH UPPER GASTROINTESTINAL ENDOSCOPY  Patient Location: PACU  Anesthesia Type:General  Level of Consciousness: awake, alert , oriented, and patient cooperative  Airway & Oxygen Therapy: Patient Spontanous Breathing and Patient connected to face mask oxygen  Post-op Assessment: Report given to RN and Post -op Vital signs reviewed and stable  Post vital signs: Reviewed and stable  Last Vitals:  Vitals Value Taken Time  BP 117/81 03/31/22 1445  Temp    Pulse 91 03/31/22 1447  Resp 22 03/31/22 1447  SpO2 99 % 03/31/22 1447  Vitals shown include unvalidated device data.  Last Pain:  Vitals:   03/31/22 1020  TempSrc: Oral  PainSc:       Patients Stated Pain Goal: 5 (18/34/37 3578)  Complications: No notable events documented.

## 2022-03-31 NOTE — Progress Notes (Signed)
PHARMACY CONSULT FOR:  Risk Assessment for Post-Discharge VTE Following Bariatric Surgery  Post-Discharge VTE Risk Assessment: This patient's probability of 30-day post-discharge VTE is increased due to the factors marked:  Sleeve gastrectomy  X Liver disorder (transplant, cirrhosis, or nonalcoholic steatohepatitis)   Hx of VTE   Hemorrhage requiring transfusion   GI perforation, leak, or obstruction   ====================================================    Female    Age >/=60 years  X  BMI >/=50 kg/m2    CHF    Dyspnea at Rest    Paraplegia  X  Non-gastric-band surgery    Operation Time >/=3 hr    Return to OR     Length of Stay >/= 3 d   Hypercoagulable condition   Significant venous stasis    Predicted probability of 30-day post-discharge VTE: 0.27% (MILD)  Other patient-specific factors to consider: n/a   Recommendation for Discharge: No pharmacologic prophylaxis post-discharge Follow daily and recalculate estimated 30d VTE risk if returns to OR or LOS reaches 3 days.   Janice Wolf is a 41 y.o. 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  No results for input(s): "WBC", "HGB", "HCT", "PLT", "APTT", "CREATININE", "LABCREA", "CREAT24HRUR", "MG", "PHOS", "ALBUMIN", "PROT", "AST", "ALT", "ALKPHOS", "BILITOT", "BILIDIR", "IBILI" in the last 72 hours. Estimated Creatinine Clearance: 137.8 mL/min (by C-G formula based on SCr of 0.86 mg/dL).  Past Medical History:  Diagnosis Date   Abnormal thyroid blood test    no treatment yet, per pt.   Anxiety    Arthritis    Back pain    Complication of anesthesia    was hard  to wake up after T & A 12/2015; had to stay overnight   Dental crowns present    Depression    Dizziness    Fatty liver    Gallbladder problem    GERD (gastroesophageal reflux disease)    Hypothyroidism    IBS (irritable bowel syndrome)    Migraines    migraines   Neuromuscular disorder ([place])    nerve damage in foot   Painful orthopaedic hardware ([place]) 09/2016   left foot   Pneumonia    PONV (postoperative nausea and vomiting)    Sleep apnea    uses cpap   Stuffy nose 09/03/2016   Vitamin D deficiency     Medications Prior to Admission  Medication Sig Dispense Refill Last Dose   Armodafinil 50 MG tablet Take 50 mg by mouth daily.   Past Week   buPROPion (WELLBUTRIN XL) 300 MG 24 hr tablet Take 300 mg by mouth daily.   03/30/2022   clindamycin (CLEOCIN T) 1 % lotion Apply 1 Application topically daily as needed (bumps in vaginal area).      cyclobenzaprine (FLEXERIL) 10 MG tablet Take 10 mg by mouth 3 (three) times daily as needed for muscle spasms.   Past Month   esomeprazole (NEXIUM) 20 MG capsule Take 20 mg by mouth daily.   03/31/2022 at 0530   fluticasone (FLONASE) 50 MCG/ACT nasal spray Place 1 spray into both nostrils daily as needed for allergies.   Past Week  gabapentin (NEURONTIN) 300 MG capsule Take 300 mg by mouth 2 (two) times daily.   03/31/2022 at 0530   ibuprofen (ADVIL) 200 MG tablet Take 800 mg by mouth daily as needed for mild pain (foot pain).      levonorgestrel (MIRENA) 20 MCG/24HR IUD 1 each by Intrauterine route once.   03/31/2022   levothyroxine (SYNTHROID) 100 MCG tablet Take 100 mcg by mouth daily before breakfast.   03/31/2022 at 0530   nystatin cream (MYCOSTATIN) Apply 1 Application topically daily as needed (fever blisters).   Past Month   prazosin (MINIPRESS) 2 MG capsule Take 2 mg by mouth 3 (three) times daily.   Past Week   Probiotic Product (DIGESTIVE ADVANTAGE GUMMIES PO) Take 2 mg by mouth daily.   Past Week   traZODone (DESYREL) 50 MG tablet  Take 50 mg by mouth at bedtime as needed for sleep.   Past Week   valACYclovir (VALTREX) 1000 MG tablet Take 2,000 mg by mouth daily as needed (Cold sores).   Past Week   lidocaine (XYLOCAINE) 5 % ointment 1 Application daily as needed (Foot pain).   More than a month     Reuel Boom, PharmD, BCPS [phone] 03/31/2022, 5:42 PM

## 2022-03-31 NOTE — H&P (Signed)
[place] Outside Information Office Visit 03/19/2022 [facility] Janice Wolf - 41 y.o. Female; born [birth year]Encounter Summary, generated on Nov. 28, 2023November 28, 2023  Reason for Visit  Reason for Visit - Reason Comments  Pre-op Exam     Encounter Details  Encounter Details Date Type Department Care Team (Latest Contact Info) Description  03/19/2022 8:45 AM EST Office Visit [facility]  [address] [place], [place] [address]  [phone]  Janice Wolf, [address] [place], [place] [address]  [phone] (Work)  [phone] (Fax)  Severe obesity (CMS-HCC) (Primary Dx); Chronic midline low back pain without sciatica; Gastroesophageal reflux disease, unspecified whether esophagitis present; OSA on CPAP; Hepatomegaly; Hepatic steatosis; Anxiety and depression; Prediabetes; Hypertriglyceridemia   Social History - documented as of this encounter Social History Tobacco Use Types Packs/Day Years Used Date  Smoking Tobacco: Never           Social History Alcohol Use Standard Drinks/Week Comments  Not Currently 0 (1 standard drink = 0.6 oz pure alcohol)     Social History Sex and Gender Information Value Date Recorded  Sex Assigned at Birth Not on file    Gender Identity Not on file    Sexual Orientation Not on file     Last Filed Vital Signs - documented in this encounter Last Filed Vital Signs Vital Sign Reading Time Taken Comments  Blood Pressure 112/72 03/19/2022 8:41 AM EST    Pulse 89 03/19/2022 8:41 AM EST    Temperature 36.8 C (98.2 F) 03/19/2022 8:41 AM EST    Respiratory Rate - -    Oxygen Saturation 98% 03/19/2022 8:41 AM EST    Inhaled Oxygen Concentration - -    Weight 159.8 kg (352 lb 6.4 oz) 03/19/2022 8:41 AM EST    Height 170.2 cm ('5\' 7"'$ ) 03/19/2022 8:41 AM EST    Body Mass Index 55.19 03/19/2022 8:41 AM EST     Patient  Instructions - documented in this encounter Patient Instructions Janice Curt, MD - 03/19/2022 8:45 AM EST  Formatting of this note might be different from the original. Do the best job you can on your preoperative meal plan Electronically signed by Janice Curt, MD at 03/19/2022 9:04 AM EST   Progress Notes - documented in this encounter Janice Curt, MD - 03/19/2022 8:45 AM EST Formatting of this note is different from the original. Images from the original note were not included.   PROVIDER: Yahia Wolf Leanne Chang, MD  MRN: [id number] DOB: 12/28/1980 DATE OF ENCOUNTER: 03/19/2022 Subjective  Chief Complaint: Pre-op Exam   History of Present Illness: [name] is a 41 y.o. female who is seen today for long-term follow-up regarding her severe obesity and related comorbidities. She has achieved nutritional and psychological clearance. Since her last visit she has decided on undergoing the Roux-en-Y gastric bypass.  She has had an upper endoscopy and colonoscopy since I last saw her. Results are below.Janice Wolf denies any chest pain, chest pressure, shortness of breath, paroxysmal nocturnal dyspnea. Some dyspnea on exertion  Her comorbidities include obstructive sleep apnea on CPAP, heartburn/GERD, bilateral knee pain, low back pain, migraines She has to take Nexium to control her reflux. She states her reflux worsened after she started taking Abilify. She states that she had a fair amount of weight gain after going on Abilify for depression and minimal anxiety. She is in the  process of being weaned off of it. She has had a cholecystectomy at age 14. She has chronic constipation. She states that she has a fatty liver. She has irregular periods. She has chronic bilateral knee pain and low back pain without sciatica. She was placed on metformin to see if that would help with weight loss. She has 2 migraines per month.  She sees a therapist once a week and has a  psychiatrist. [facility]ill [facility]  Review of Systems: A complete review of systems was obtained from the patient. I have reviewed this information and discussed as appropriate with the patient. See HPI as well for other ROS.  ROS  Medical History: Past Medical History: Diagnosis Date Anxiety GERD (gastroesophageal reflux disease) Sleep apnea Thyroid disease  There is no problem list on file for this patient.  Past Surgical History: Procedure Laterality Date CHOLECYSTECTOMY TONSILLECTOMY   Allergies Allergen Reactions Aspirin Itching and Nausea And Vomiting  Current Outpatient Medications on File Prior to Visit Medication Sig Dispense Refill ARIPiprazole (ABILIFY) 5 MG tablet (Patient not taking: Reported on 03/19/2022) escitalopram oxalate (LEXAPRO) 20 MG tablet Take 1 tablet by mouth once daily (Patient not taking: Reported on 03/19/2022) esomeprazole (NEXIUM) 20 MG DR capsule Take by mouth (Patient not taking: Reported on 03/19/2022) gabapentin (NEURONTIN) 300 MG capsule Take 1 capsule by mouth 3 (three) times daily levothyroxine (SYNTHROID) 100 MCG tablet TAKE 1 TABLET BY MOUTH EVERY DAY IN THE MORNING ON AN EMPTY STOMACH metFORMIN (GLUCOPHAGE-XR) 500 MG XR tablet prazosin (MINIPRESS) 2 MG capsule  No current facility-administered medications on file prior to visit.  Family History Problem Relation Age of Onset Diabetes Mother High blood pressure (Hypertension) Father Coronary Artery Disease (Blocked arteries around heart) Father Diabetes Father   Social History  Tobacco Use Smoking Status Never Smokeless Tobacco Not on file   Social History  Socioeconomic History Marital status: Married Tobacco Use Smoking status: Never Substance and Sexual Activity Alcohol use: Not Currently Drug use: Never  Objective:  Vitals: 03/19/22 0841 BP: 112/72 Pulse: 89 Temp: 36.8 C (98.2 F) SpO2: 98% Weight: (!) 159.8 kg (352 lb  6.4 oz) Height: 170.2 cm ('5\' 7"'$ )  Body mass index is 55.19 kg/m.  Gen: alert, NAD, non-toxic appearing, severe obesity Pupils: equal, no scleral icterus Pulm: Lungs clear to auscultation, symmetric chest rise CV: regular rate and rhythm Abd: soft, nontender, nondistended. old trocar sites. No cellulitis. No incisional hernia Ext: no edema, Skin: no rash, no jaundice  Labs, Imaging and Diagnostic Testing:  Abd u/s 12/05/21 ULTRASOUND ABDOMEN LIMITED RIGHT UPPER QUADRANT  COMPARISON: None Available.  FINDINGS: Gallbladder:  Surgically absent  Common bile duct:  The common bile duct is not visualized.  Liver:  Marked increased echogenicity throughout the liver. No focal mass identified. Evaluation is limited due to patient's body habitus. Portal vein is patent on color Doppler imaging with normal direction of blood flow towards the liver.  Other: None.  IMPRESSION: 1. The study is markedly limited due to the patient's body habitus. 2. The gallbladder is surgically absent. 3. The common bile duct is not visualized. 4. Diffuse increased echogenicity throughout the liver is nonspecific but consistent with hepatic steatosis identified on the recent CT scan.  CT A&P 11/25/21 IMPRESSION: 1. Possible constipation. No other explanation for right lower quadrant pain. 2. Hepatic steatosis and hepatomegaly. Caudate and lateral segment left liver lobe enlargement are most consistent with mild cirrhosis. 3. Lack of visualization of the appendix. 4. Tiny  hiatal hernia. 5. Mild pelvic floor laxity.  Labs 01/13/22 Iron ok Cbc ok - wbc 11.8, plt count 280 A1c 5.8 Cmet ok except glucose 113 TSH ok Lipid - TG 240, o/w ok  egd/colonosocpy 02/03/22 - egd ok except for 1cm HH, neg H pylori, colon - neg bx  Assessment and Plan: Diagnoses and all orders for this visit:  Severe obesity (CMS-HCC)  Chronic midline low back pain without sciatica  Gastroesophageal reflux  disease, unspecified whether esophagitis present  OSA on CPAP  Hepatomegaly  Hepatic steatosis  Anxiety and depression  Prediabetes  Hypertriglyceridemia    Reviewed her upper and lower endoscopy reports. Reviewed her labs. She has attended her preoperative education class. She has decided on Roux-en-Y gastric bypass. Even though she may have some radiological signs of mild cirrhosis her platelet count is normal. Her LFTs are normal. There is no signs of abnormal blood vessels in the abdomen on her CT like varices so I think it safe to proceed with a Roux-en-Y. We discussed the importance of the preoperative meal plan. We rediscussed the typical hospitalization. She reviewed the surgical consent form and signed. She was here today with her husband. We discussed the postoperative vitamin supplements. We rediscussed the typical issues that we see the first few weeks after surgery. All of her questions were asked and answered.  This patient encounter took 30 minutes today to perform the following: take history, perform exam, review outside records, interpret imaging, counsel the patient on their diagnosis and document encounter, findings & plan in the EHR  No follow-ups on file.  Leighton Ruff. Redmond Pulling MD FACS General, Minimally Invasive, & Bariatric Surgery Electronically signed by Janice Curt, MD at 03/19/2022 9:08 AM EST

## 2022-03-31 NOTE — Anesthesia Preprocedure Evaluation (Addendum)
Anesthesia Evaluation  Patient identified by MRN, date of birth, ID band Patient awake    Reviewed: Allergy & Precautions, H&P , NPO status , Patient's Chart, lab work & pertinent test results  History of Anesthesia Complications (+) PONV and history of anesthetic complications  Airway Mallampati: III  TM Distance: >3 FB Neck ROM: Full    Dental no notable dental hx. (+) Teeth Intact, Dental Advisory Given   Pulmonary sleep apnea and Continuous Positive Airway Pressure Ventilation    Pulmonary exam normal breath sounds clear to auscultation       Cardiovascular negative cardio ROS  Rhythm:Regular Rate:Normal     Neuro/Psych  Headaches  Anxiety Depression       GI/Hepatic Neg liver ROS,GERD  Medicated,,  Endo/Other  Hypothyroidism  Morbid obesity  Renal/GU negative Renal ROS  negative genitourinary   Musculoskeletal  (+) Arthritis , Osteoarthritis,    Abdominal   Peds  Hematology negative hematology ROS (+)   Anesthesia Other Findings   Reproductive/Obstetrics negative OB ROS                             Anesthesia Physical Anesthesia Plan  ASA: 3  Anesthesia Plan: General   Post-op Pain Management: Tylenol PO (pre-op)*, Ketamine IV* and Lidocaine infusion*   Induction: Intravenous  PONV Risk Score and Plan: 4 or greater and Ondansetron, Dexamethasone and Midazolam  Airway Management Planned: Oral ETT  Additional Equipment:   Intra-op Plan:   Post-operative Plan: Extubation in OR  Informed Consent: I have reviewed the patients History and Physical, chart, labs and discussed the procedure including the risks, benefits and alternatives for the proposed anesthesia with the patient or authorized representative who has indicated his/her understanding and acceptance.     Dental advisory given  Plan Discussed with: CRNA  Anesthesia Plan Comments:        Anesthesia Quick  Evaluation

## 2022-03-31 NOTE — Op Note (Signed)
   Patient: Janice Wolf ([birth year], [id number])  Date of Surgery: 03/31/2022   Preoperative Diagnosis: MORBID OBESITY   Postoperative Diagnosis: MORBID OBESITY   Surgical Procedure: Upper Endoscopy   Surgeon: Louanna Raw, MD  Anesthesiologist: Roderic Palau, MD CRNA: Cleda Daub, CRNA; Montel Clock, CRNA   Anesthesia: General   Fluids:  Total I/O In: 100 [IV [id number]:425] Out: -   Complications: None  Drains:  None  Specimen: None   Indications for Procedure: [name] is a 41 y.o. female undergoing laparoscopic gastric bypass and an EGD was requested to evaluate foregut anatomy intraoperatively.  Description of Procedure: During the procedure, I scrubbed out and obtained the Olympus endoscope. I gently placed endoscope in the patient's oropharynx and gently glided it down the esophagus without any difficulty under direct visualization.  The scope was advanced as far as the roux limb and then slowly withdrawn to inspect the foregut anatomy.  Dr. Redmond Pulling had placed saline in the upper abdomen and all staple lines were submerged to ensure no air leak. There was no evidence of bubbles. There was no evidence of intraluminal bleeding and the mucosa appeared healthy.  The lumen was widely patent without evidence of stricture.  The intraluminal insufflation was decompressed. The scope was withdrawn. The patient tolerated this portion of the procedure well. Please see Dr Dois Davenport operative note for details regarding the remainder of the procedure.    Louanna Raw, MD General, Bariatric, & Minimally Invasive Surgery [facility], [place]

## 2022-03-31 NOTE — Op Note (Signed)
Janice Wolf [id number] 1980-06-05. 03/31/2022  Preoperative diagnosis:  Severe obesity (BMI 53)  Chronic midline low back pain without sciatica  Gastroesophageal reflux disease, unspecified whether esophagitis present  OSA on CPAP  Hepatomegaly  Hepatic steatosis  Anxiety and depression  Prediabetes  Hypertriglyceridemia   Postoperative  diagnosis:  1. same  Surgical procedure: Laparoscopic Roux-en-Y gastric bypass (ante-colic, ante-gastric); upper endoscopy Laparoscopic bilateral tap block  Surgeon: Gayland Curry, M.D. FACS  Asst.: Louanna Raw MD  Anesthesia: General plus exparel/marcaine mix  Complications: None   EBL: Minimal   Drains: None   Disposition: PACU in good condition   Indications for procedure: 41 y.o. yo female with morbid obesity who has been unsuccessful at sustained weight loss. The patient's comorbidities are listed above. We discussed the risk and benefits of surgery including but not limited to anesthesia risk, bleeding, infection, blood clot formation, anastomotic leak, anastomotic stricture, ulcer formation, death, respiratory complications, intestinal blockage, internal hernia, gallstone formation, vitamin and nutritional deficiencies, injury to surrounding structures, failure to lose weight and mood changes.   Description of procedure: Patient is brought to the operating room and general anesthesia induced. The patient had received preoperative broad-spectrum IV antibiotics and subcutaneous heparin. The abdomen was widely sterilely prepped with Chloraprep and draped. Patient timeout was performed and correct patient and procedure confirmed. Access was obtained with a  5 mm Optiview trocar in the left upper quadrant and pneumoperitoneum established without difficulty. Under direct vision 12 mm trocars were placed laterally in the right upper quadrant, right upper quadrant midclavicular line, and to the left and above the umbilicus for  the camera port. A 5 mm trocar was placed laterally in the left upper quadrant.  Exparel/marcaine mix was infiltrated in bilateral lateral abdominal walls as a TAP block for postoperative pain relief. Patient had fatty liver.  The omentum was brought into the upper abdomen and the transverse mesocolon elevated and the ligament of Treitz clearly identified. A 50 cm biliopancreatic limb was then carefully measured from the ligament of Treitz. The small intestine was divided at this point with a single firing of the white load linear stapler. A Penrose drain was sutured to the end of the Roux-en-Y limb for later identification. A 100 cm Roux-en-Y limb was then carefully measured. At this point a side-to-side anastomosis was created between the Roux limb and the end of the biliopancreatic limb. This was accomplished with a single firing of the 60 mm white load linear stapler. The common enterotomy was closed with a running 2-0 Vicryl begun at either end of the enterotomy and tied centrally. Vistaseal tissue sealant was placed over the anastomosis. The mesenteric defect was then closed with running 2-0 silk. The omentum was then divided with the harmonic scalpel up towards the transverse colon to allow mobility of the Roux limb toward the gastric pouch. The patient was then placed in steep reversed Trendelenburg. Through a 5 mm subxiphoid site the [facility] retractor was placed and the left lobe of the liver elevated with excellent exposure of the upper stomach and hiatus. The angle of Hiss was then mobilized with the harmonic scalpel. No evidence of hiatal hernia. Patient had caudate lobe enlargement. A 5 cm gastric pouch was then carefully measured along the lesser curve of the stomach. Dissection was carried along the lesser curve at this point with the Harmonic scalpel working carefully back toward the lesser sac at right angles to the lesser curve. The free lesser sac was then entered. After being  sure all tubes  were removed from the stomach an initial firing of the gold load 60 mm linear stapler was fired at right angles across the lesser curve for about 4 cm. The gastric pouch was further mobilized posteriorly and then the pouch was completed with 3 further firings of the 60 mm blue load linear stapler up through the previously dissected angle of His. It was ensured that the pouch was completely mobilized away from the gastric remnant. The third firing had ethicon staple line reinforcement. This created a nice tubular 4-5 cm gastric pouch. The Roux limb was then brought up in an antecolic fashion with the candycane facing to the patient's left without undue tension. The gastrojejunostomy was created with an initial posterior row of 2-0 Vicryl between the Roux limb and the staple line of the gastric pouch. Enterotomies were then made in the gastric pouch and the Roux limb with the harmonic scalpel and at approximately 2-2-1/2 cm anastomosis was created with a single firing of the 76m blue load linear stapler. The staple line was inspected and was intact without bleeding. The common enterotomy was then closed with running 2-0 Vicryl begun at either end and tied centrally. The Ewall tube was then easily passed through the anastomosis and an outer anterior layer of running 2-0 Vicryl was placed. The Ewald tube was removed. With the outlet of the gastrojejunostomy clamped and under saline irrigation the assistant performed upper endoscopy and with the gastric pouch tensely distended with air-there was no evidence of leak on this test. The pouch was desufflated. The PTerance Hartdefect was closed with running 2-0 silk. The abdomen was inspected for any evidence of bleeding or bowel injury and everything looked fine. The Nathanson retractor was removed under direct vision after coating the anastomosis with Vistaseal tissue sealant. All CO2 was evacuated and trochars removed. Skin incisions were closed with 4-0 monocryl in a  subcuticular fashion followed by  steri-strips and bandages. Sponge needle and instrument counts were correct. The patient was taken to the PACU in good condition.    ELeighton Ruff WRedmond Pulling MD, FACS General, Bariatric, & Minimally Invasive Surgery [facility], [place]

## 2022-03-31 NOTE — Anesthesia Postprocedure Evaluation (Signed)
Anesthesia Post Note  Patient: SADHANA FRATER  Procedure(s) Performed: LAPAROSCOPIC ROUX-EN-Y GASTRIC BYPASS WITH UPPER GASTROINTESTINAL ENDOSCOPY     Patient location during evaluation: PACU Anesthesia Type: General Level of consciousness: awake and alert Pain management: pain level controlled Vital Signs Assessment: post-procedure vital signs reviewed and stable Respiratory status: spontaneous breathing, nonlabored ventilation, respiratory function stable and patient connected to nasal cannula oxygen Cardiovascular status: blood pressure returned to baseline and stable Postop Assessment: no apparent nausea or vomiting Anesthetic complications: no  No notable events documented.  Last Vitals:  Vitals:   03/31/22 1646 03/31/22 1713  BP: 131/76 (!) 145/98  Pulse:  82  Resp:  17  Temp:  36.7 C  SpO2:  96%    Last Pain:  Vitals:   03/31/22 1713  TempSrc: Oral  PainSc:                  Wm Fruchter,W. EDMOND

## 2022-03-31 NOTE — Interval H&P Note (Signed)
History and Physical Interval Note:  03/31/2022 11:38 AM  Janice Wolf  has presented today for surgery, with the diagnosis of MORBID OBESITY.  The various methods of treatment have been discussed with the patient and family. After consideration of risks, benefits and other options for treatment, the patient has consented to  Procedure(s): LAPAROSCOPIC ROUX-EN-Y GASTRIC BYPASS WITH UPPER GASTROINTESTINAL ENDOSCOPY WITH HIATAL HERNIA REPAIR (N/A) as a surgical intervention.  The patient's history has been reviewed, patient examined, no change in status, stable for surgery.  I have reviewed the patient's chart and labs.  Questions were answered to the patient's satisfaction.     Greer Pickerel

## 2022-03-31 NOTE — Anesthesia Procedure Notes (Signed)
Procedure Name: Intubation Date/Time: 03/31/2022 12:15 PM  Performed by: Cleda Daub, CRNAPre-anesthesia Checklist: Patient identified, Emergency Drugs available, Suction available and Patient being monitored Patient Re-evaluated:Patient Re-evaluated prior to induction Oxygen Delivery Method: Circle system utilized Preoxygenation: Pre-oxygenation with 100% oxygen Induction Type: IV induction Ventilation: Mask ventilation without difficulty Laryngoscope Size: Mac and 4 Grade View: Grade I Tube type: Oral Tube size: 7.0 mm Number of attempts: 1 Airway Equipment and Method: Stylet and Oral airway Placement Confirmation: ETT inserted through vocal cords under direct vision, positive ETCO2 and breath sounds checked- equal and bilateral Secured at: 22 cm Tube secured with: Tape Dental Injury: Teeth and Oropharynx as per pre-operative assessment

## 2022-04-01 ENCOUNTER — Encounter (HOSPITAL_COMMUNITY): Payer: Self-pay | Admitting: General Surgery

## 2022-04-01 LAB — CBC WITH DIFFERENTIAL/PLATELET
Abs Immature Granulocytes: 0.07 10*3/uL (ref 0.00–0.07)
Basophils Absolute: 0 10*3/uL (ref 0.0–0.1)
Basophils Relative: 0 %
Eosinophils Absolute: 0 10*3/uL (ref 0.0–0.5)
Eosinophils Relative: 0 %
HCT: 44.5 % (ref 36.0–46.0)
Hemoglobin: 15 g/dL (ref 12.0–15.0)
Immature Granulocytes: 1 %
Lymphocytes Relative: 9 %
Lymphs Abs: 1.4 10*3/uL (ref 0.7–4.0)
MCH: 32.3 pg (ref 26.0–34.0)
MCHC: 33.7 g/dL (ref 30.0–36.0)
MCV: 95.7 fL (ref 80.0–100.0)
Monocytes Absolute: 0.8 10*3/uL (ref 0.1–1.0)
Monocytes Relative: 5 %
Neutro Abs: 13.1 10*3/uL — ABNORMAL HIGH (ref 1.7–7.7)
Neutrophils Relative %: 85 %
Platelets: 274 10*3/uL (ref 150–400)
RBC: 4.65 MIL/uL (ref 3.87–5.11)
RDW: 13.4 % (ref 11.5–15.5)
WBC: 15.5 10*3/uL — ABNORMAL HIGH (ref 4.0–10.5)
nRBC: 0 % (ref 0.0–0.2)

## 2022-04-01 LAB — COMPREHENSIVE METABOLIC PANEL
ALT: 57 U/L — ABNORMAL HIGH (ref 0–44)
AST: 63 U/L — ABNORMAL HIGH (ref 15–41)
Albumin: 3.4 g/dL — ABNORMAL LOW (ref 3.5–5.0)
Alkaline Phosphatase: 63 U/L (ref 38–126)
Anion gap: 9 (ref 5–15)
BUN: 8 mg/dL (ref 6–20)
CO2: 23 mmol/L (ref 22–32)
Calcium: 8.9 mg/dL (ref 8.9–10.3)
Chloride: 106 mmol/L (ref 98–111)
Creatinine, Ser: 0.87 mg/dL (ref 0.44–1.00)
GFR, Estimated: >60 mL/min (ref >60)
Glucose, Bld: 140 mg/dL — ABNORMAL HIGH (ref 70–99)
Potassium: 4.5 mmol/L (ref 3.5–5.1)
Sodium: 138 mmol/L (ref 135–145)
Total Bilirubin: 0.8 mg/dL (ref 0.3–1.2)
Total Protein: 7.2 g/dL (ref 6.5–8.1)

## 2022-04-01 MED ORDER — SUMATRIPTAN SUCCINATE 6 MG/0.5ML ~~LOC~~ SOLN
6.0000 mg | SUBCUTANEOUS | Status: DC | PRN
  Administered 2022-04-01: 6 mg via SUBCUTANEOUS
  Filled 2022-04-01 (×2): qty 0.5

## 2022-04-01 MED ORDER — AMITRIPTYLINE HCL 50 MG PO TABS
50.0000 mg | ORAL_TABLET | Freq: Once | ORAL | Status: AC
  Administered 2022-04-01: 50 mg via ORAL
  Filled 2022-04-01: qty 1

## 2022-04-01 NOTE — Progress Notes (Signed)
Patient family did not produce home mask for CPAP use tonight. Declines use of hospital mask due to discomfort of fit. Equipment remains at bedside for future use once family brings home mask. RT will continue to follow.

## 2022-04-01 NOTE — Progress Notes (Signed)
1 Day Post-Op   Subjective/Chief Complaint: Pt seen & examined this am Tolerating water but biggest c/o is HA-migraine Reports has taken elavil in the past Ambulated Some soreness under rib cage   Objective: Vital signs in last 24 hours: Temp:  [97.8 F (36.6 C)-98.6 F (37 C)] 97.8 F (36.6 C) (11/29 1752) Pulse Rate:  [76-97] 76 (11/29 1752) Resp:  [18] 18 (11/29 1752) BP: (105-136)/(69-85) 114/82 (11/29 1752) SpO2:  [94 %-98 %] 98 % (11/29 1752) Last BM Date : 03/31/22  Intake/Output from previous day: 11/28 0701 - 11/29 0700 In: 2281.9 [P.O.:300; I.V.:1881.9; IV Piggyback:100] Out: 3700 [Urine:3650; Blood:50] Intake/Output this shift: No intake/output data recorded.  Alert, nontoxic, not ill appearing Nonlabored Reg Soft, incisions ok, mild approp TTP  Lab Results:  Recent Labs    03/31/22 1741 04/01/22 0454  WBC 13.7* 15.5*  HGB 14.7 15.0  HCT 44.6 44.5  PLT 247 274   BMET Recent Labs    03/31/22 1741 04/01/22 0454  NA  --  138  K  --  4.5  CL  --  106  CO2  --  23  GLUCOSE  --  140*  BUN  --  8  CREATININE 0.92 0.87  CALCIUM  --  8.9   PT/INR No results for input(s): "LABPROT", "INR" in the last 72 hours. ABG No results for input(s): "PHART", "HCO3" in the last 72 hours.  Invalid input(s): "PCO2", "PO2"  Studies/Results: No results found.  Anti-infectives: Anti-infectives (From admission, onward)    Start     Dose/Rate Route Frequency Ordered Stop   03/31/22 1015  cefoTEtan (CEFOTAN) 2 g in sodium chloride 0.9 % 100 mL IVPB        2 g 200 mL/hr over 30 Minutes Intravenous On call to O.R. 03/31/22 1009 03/31/22 1246       Assessment/Plan: s/p Procedure(s): LAPAROSCOPIC ROUX-EN-Y GASTRIC BYPASS WITH UPPER GASTROINTESTINAL ENDOSCOPY (N/A)  Vte prophylaxis - scds, heparin Cbc ok No fever no tachycardia Cont bari clears/fulls as tolerated Give elavil dose  Pt didn't meet dc criteria due to inadequate oral intake from migraine.    LOS: 0 days    Greer Pickerel 04/01/2022

## 2022-04-01 NOTE — Progress Notes (Signed)
  Transition of Care [facility]) Screening Note   Patient Details  Name: JENNESSA TRIGO Date of Birth: 08-04-1980   [facility]) CM/SW Contact:    Vassie Moselle, LCSW Phone Number: 04/01/2022, 9:22 AM    Transition of Care Department [facility]) has reviewed patient and no TOC needs have been identified at this time. We will continue to monitor patient advancement through interdisciplinary progression rounds. If new patient transition needs arise, please place a TOC consult.

## 2022-04-01 NOTE — Progress Notes (Signed)
Patient alert and oriented, pain is controlled. Patient is tolerating fluids, advanced to protein shake today, patient is tolerating well. Reviewed Gastric Bypass discharge instructions with patient and patient is able to articulate understanding. Provided information on [facility] program, Support Group and [facility] outpatient pharmacy. All questions answered, will continue to monitor.    

## 2022-04-02 ENCOUNTER — Other Ambulatory Visit: Payer: Self-pay

## 2022-04-02 LAB — CBC WITH DIFFERENTIAL/PLATELET
Abs Immature Granulocytes: 0.04 10*3/uL (ref 0.00–0.07)
Basophils Absolute: 0.1 10*3/uL (ref 0.0–0.1)
Basophils Relative: 1 %
Eosinophils Absolute: 0.1 10*3/uL (ref 0.0–0.5)
Eosinophils Relative: 1 %
HCT: 39.3 % (ref 36.0–46.0)
Hemoglobin: 12.8 g/dL (ref 12.0–15.0)
Immature Granulocytes: 0 %
Lymphocytes Relative: 34 %
Lymphs Abs: 3.7 10*3/uL (ref 0.7–4.0)
MCH: 32.3 pg (ref 26.0–34.0)
MCHC: 32.6 g/dL (ref 30.0–36.0)
MCV: 99.2 fL (ref 80.0–100.0)
Monocytes Absolute: 0.8 10*3/uL (ref 0.1–1.0)
Monocytes Relative: 7 %
Neutro Abs: 6.2 10*3/uL (ref 1.7–7.7)
Neutrophils Relative %: 57 %
Platelets: 231 10*3/uL (ref 150–400)
RBC: 3.96 MIL/uL (ref 3.87–5.11)
RDW: 13.4 % (ref 11.5–15.5)
WBC: 10.8 10*3/uL — ABNORMAL HIGH (ref 4.0–10.5)
nRBC: 0 % (ref 0.0–0.2)

## 2022-04-02 MED ORDER — OXYCODONE HCL 5 MG PO TABS: 5.0000 mg | ORAL_TABLET | Freq: Four times a day (QID) | ORAL | 0 refills | Status: DC | PRN

## 2022-04-02 MED ORDER — ONDANSETRON 4 MG PO TBDP: 4.0000 mg | ORAL_TABLET | Freq: Four times a day (QID) | ORAL | 0 refills | Status: AC | PRN

## 2022-04-02 MED ORDER — ACETAMINOPHEN 500 MG PO TABS: 1000.0000 mg | ORAL_TABLET | Freq: Three times a day (TID) | ORAL | 0 refills | Status: AC

## 2022-04-02 NOTE — Plan of Care (Signed)
  Problem: Education: Goal: Knowledge of General Education information will improve Description: Including pain rating scale, medication(s)/side effects and non-pharmacologic comfort measures Outcome: Adequate for Discharge   Problem: Health Behavior/Discharge Planning: Goal: Ability to manage health-related needs will improve Outcome: Adequate for Discharge   Problem: Clinical Measurements: Goal: Ability to maintain clinical measurements within normal limits will improve Outcome: Adequate for Discharge Goal: Will remain free from infection Outcome: Adequate for Discharge Goal: Diagnostic test results will improve Outcome: Adequate for Discharge Goal: Respiratory complications will improve Outcome: Adequate for Discharge Goal: Cardiovascular complication will be avoided Outcome: Adequate for Discharge   Problem: Activity: Goal: Risk for activity intolerance will decrease Outcome: Adequate for Discharge   Problem: Nutrition: Goal: Adequate nutrition will be maintained Outcome: Adequate for Discharge   Problem: Coping: Goal: Level of anxiety will decrease Outcome: Adequate for Discharge   Problem: Elimination: Goal: Will not experience complications related to bowel motility Outcome: Adequate for Discharge Goal: Will not experience complications related to urinary retention Outcome: Adequate for Discharge   Problem: Pain Managment: Goal: General experience of comfort will improve Outcome: Adequate for Discharge   Problem: Safety: Goal: Ability to remain free from injury will improve Outcome: Adequate for Discharge   Problem: Skin Integrity: Goal: Risk for impaired skin integrity will decrease Outcome: Adequate for Discharge   Problem: Education: Goal: Ability to state signs and symptoms to report to health care provider will improve Outcome: Adequate for Discharge Goal: Knowledge of the prescribed self-care regimen will improve Outcome: Adequate for  Discharge Goal: Knowledge of discharge needs will improve Outcome: Adequate for Discharge   Problem: Activity: Goal: Ability to tolerate increased activity will improve Outcome: Adequate for Discharge   Problem: Bowel/Gastric: Goal: Gastrointestinal status for postoperative course will improve Outcome: Adequate for Discharge Goal: Occurrences of nausea will decrease Outcome: Adequate for Discharge   Problem: Coping: Goal: Development of coping mechanisms to deal with changes in body function or appearance will improve Outcome: Adequate for Discharge   Problem: Fluid Volume: Goal: Maintenance of adequate hydration will improve Outcome: Adequate for Discharge   Problem: Nutritional: Goal: Nutritional status will improve Outcome: Adequate for Discharge   Problem: Clinical Measurements: Goal: Will show no signs or symptoms of venous thromboembolism Outcome: Adequate for Discharge Goal: Will remain free from infection Outcome: Adequate for Discharge Goal: Will show no signs of GI Leak Outcome: Adequate for Discharge   Problem: Respiratory: Goal: Will regain and/or maintain adequate ventilation Outcome: Adequate for Discharge   Problem: Pain Management: Goal: Pain level will decrease Outcome: Adequate for Discharge   Problem: Skin Integrity: Goal: Demonstration of wound healing without infection will improve Outcome: Adequate for Discharge

## 2022-04-02 NOTE — Discharge Summary (Signed)
Physician Discharge Summary  Janice Wolf [facility]:[id number] DOB: 04-Jul-1980 DOA: 03/31/2022  PCP: Jonathon Jordan, MD  Admit date: 03/31/2022 Discharge date: 04/02/2022  Recommendations for Outpatient Follow-up:     Follow-up Information     Greer Pickerel, MD. Go on 04/24/2022.   Specialty: General Surgery Why: at 11am. Please arrive 15 minutes prior to your appointment time. Thank you. Contact information: [address] [place] [address] [phone]         Maczis, Carlena Hurl, [place]. Go on 05/26/2022.   Specialty: General Surgery Why: at 4pm for Dr. Redmond Pulling. Please arrive 15 minutes prior to your appointment time. Thank you. Contact information: [address] [place] [address] [phone]                Discharge Diagnoses:  Principal Problem:   S/P gastric bypass Severe obesity (BMI 53)  Chronic midline low back pain without sciatica  Gastroesophageal reflux disease, unspecified whether esophagitis present  OSA on CPAP  Hepatomegaly  Hepatic steatosis  Anxiety and depression  Prediabetes  Hypertriglyceridemia   Surgical Procedure: Laparoscopic Roux-en-Y gastric bypass, upper endoscopy  Discharge Condition: Good Disposition: Home  Diet recommendation: Postoperative gastric bypass diet  Filed Weights   03/31/22 1013 03/31/22 1019  Weight: (!) 156.9 kg (!) 156.9 kg     Hospital Course:  The patient was admitted for a planned laparoscopic Roux-en-Y gastric bypass. Please see operative note. Preoperatively the patient was given 5000 units of subcutaneous heparin for DVT prophylaxis. ERAS protocol was used. Postoperative prophylactic heparin dosing was started on the evening of postoperative day 0.  The patient was started on ice chips and water on the evening of POD 0 which they tolerated. On postoperative day 1 The patient's diet was advanced to protein shakes which they also tolerated however because of her  migraine she had nausea and didn't meet criteria for oral intake for discharge. On POD 2, The patient was ambulating without difficulty. Her migraine had resolved. Her oral intake had significantly improved.  Their vital signs are stable without fever or tachycardia. Their hemoglobin had remained stable. The patient declined CPAP therapy. The patient had received discharge instructions and counseling. They were deemed stable for discharge.the pt indicated she was no longer taking wellbutrin  BP 94/67 (BP Location: Right Arm)   Pulse 61   Temp (!) 97.4 F (36.3 C) (Oral)   Resp 16   Ht 5' 7.5" (1.715 m)   Wt (!) 156.9 kg   LMP  (LMP Unknown)   SpO2 97%   BMI 53.39 kg/m   Gen: alert, NAD, non-toxic appearing Pupils: equal, no scleral icterus Pulm: Lungs clear to auscultation, symmetric chest rise CV: regular rate and rhythm Abd: soft, min tender, nondistended. No cellulitis. No incisional hernia Ext: no edema, no calf tenderness Skin: no rash, no jaundice  Discharge Instructions  Discharge Instructions     Ambulate hourly while awake   Complete by: As directed    Call MD for:  difficulty breathing, headache or visual disturbances   Complete by: As directed    Call MD for:  persistant dizziness or light-headedness   Complete by: As directed    Call MD for:  persistant nausea and vomiting   Complete by: As directed    Call MD for:  redness, tenderness, or signs of infection (pain, swelling, redness, odor or green/yellow discharge around incision site)   Complete by: As directed    Call MD for:  severe uncontrolled pain   Complete by: As directed    Call MD for:  temperature >101 F   Complete by: As directed    Diet bariatric full liquid   Complete by: As directed    Discharge instructions   Complete by: As directed    See bariatric discharge instructions   Incentive spirometry   Complete by: As directed    Perform hourly while awake      Allergies as of 04/02/2022        Reactions   Aspirin Nausea And Vomiting   Latex Other (See Comments)   SKIN IRRITATION   Other Rash   PERFUMES        Medication List     STOP taking these medications    buPROPion 300 MG 24 hr tablet Commonly known as: WELLBUTRIN XL   ibuprofen 200 MG tablet Commonly known as: ADVIL       TAKE these medications    acetaminophen 500 MG tablet Commonly known as: TYLENOL Take 2 tablets (1,000 mg total) by mouth every 8 (eight) hours for 5 days.   Armodafinil 50 MG tablet Take 50 mg by mouth daily.   clindamycin 1 % lotion Commonly known as: CLEOCIN T Apply 1 Application topically daily as needed (bumps in vaginal area).   cyclobenzaprine 10 MG tablet Commonly known as: FLEXERIL Take 10 mg by mouth 3 (three) times daily as needed for muscle spasms.   DIGESTIVE ADVANTAGE GUMMIES PO Take 2 mg by mouth daily.   esomeprazole 20 MG capsule Commonly known as: NEXIUM Take 20 mg by mouth daily.   fluticasone 50 MCG/ACT nasal spray Commonly known as: FLONASE Place 1 spray into both nostrils daily as needed for allergies.   gabapentin 300 MG capsule Commonly known as: NEURONTIN Take 300 mg by mouth 2 (two) times daily.   levonorgestrel 20 MCG/24HR IUD Commonly known as: MIRENA 1 each by Intrauterine route once. Notes to patient: Medication may not be effective for the next 30 days.  Use precaution if necessary.     levothyroxine 100 MCG tablet Commonly known as: SYNTHROID Take 100 mcg by mouth daily before breakfast.   lidocaine 5 % ointment Commonly known as: XYLOCAINE 1 Application daily as needed (Foot pain).   nystatin cream Commonly known as: MYCOSTATIN Apply 1 Application topically daily as needed (fever blisters).   ondansetron 4 MG disintegrating tablet Commonly known as: ZOFRAN-ODT Take 1 tablet (4 mg total) by mouth every 6 (six) hours as needed for nausea or vomiting.   oxyCODONE 5 MG immediate release tablet Commonly known as: Oxy  IR/ROXICODONE Take 1 tablet (5 mg total) by mouth every 6 (six) hours as needed for severe pain.   prazosin 2 MG capsule Commonly known as: MINIPRESS Take 2 mg by mouth 3 (three) times daily. Notes to patient: Monitor Blood Pressure Daily and keep a log for primary care physician.  You may need to make changes to your medications with rapid weight loss.      traZODone 50 MG tablet Commonly known as: DESYREL Take 50 mg by mouth at bedtime as needed for sleep.   valACYclovir 1000 MG tablet Commonly known as: VALTREX Take 2,000 mg by mouth daily as needed (Cold sores).        Follow-up Information     Greer Pickerel, MD. Go on 04/24/2022.   Specialty: General Surgery Why: at 11am. Please arrive 15 minutes prior to your appointment time. Thank you. Contact information: [address]  [address] [place]  [address] [phone]         Maczis, Carlena Hurl, [place]. Go on 05/26/2022.   Specialty: General Surgery Why: at 4pm for Dr. Redmond Pulling. Please arrive 15 minutes prior to your appointment time. Thank you. Contact information: [address] [place]  [address] [phone]                  The results of significant diagnostics from this hospitalization (including imaging, microbiology, ancillary and laboratory) are listed below for reference.    Significant Diagnostic Studies: No results found.  Labs: Basic Metabolic Panel: Recent Labs  Lab 03/31/22 1741 04/01/22 0454  NA  --  138  K  --  4.5  CL  --  106  CO2  --  23  GLUCOSE  --  140*  BUN  --  8  CREATININE 0.92 0.87  CALCIUM  --  8.9   Liver Function Tests: Recent Labs  Lab 04/01/22 0454  AST 63*  ALT 57*  ALKPHOS 63  BILITOT 0.8  PROT 7.2  ALBUMIN 3.4*    CBC: Recent Labs  Lab 03/31/22 1741 04/01/22 0454 04/02/22 0433  WBC 13.7* 15.5* 10.8*  NEUTROABS  --  13.1* 6.2  HGB 14.7 15.0 12.8  HCT 44.6 44.5 39.3  MCV 98.0 95.7 99.2  PLT 247 274 231    CBG: No  results for input(s): "GLUCAP" in the last 168 hours.  Principal Problem:   S/P gastric bypass   Time coordinating discharge: 15 min  Signed:  Gayland Curry, MD [facility], [place] 04/02/2022, 5:20 PM

## 2022-04-02 NOTE — Progress Notes (Signed)
Assessment unchanged. Pt verbalized understanding of dc instructions including medications, diet, follow up care and when to call the doctor. Pt had no questions about the Bariatric teaching that Madlyn Frankel, RN provided yesterday. Discharged via wc by NT.

## 2022-04-03 ENCOUNTER — Telehealth (HOSPITAL_COMMUNITY): Payer: Self-pay | Admitting: *Deleted

## 2022-04-07 NOTE — Telephone Encounter (Signed)
1.  Tell me about your pain and pain management? Pt denies any pain, just states that she is sore.  2.  Let's talk about fluid intake.  How much total fluid are you taking in? Pt states that she is getting in at least 60oz of fluid including protein shakes, bottled water, and broth. Pt instructed to assess status and suggestions daily utilizing Hydration Action Plan on discharge folder and to call [facility] if in the "red zone".   3.  How much protein have you taken in the last 2 days? Pt states that she is working to meet goal of goal of 60g of protein today. Pt states that she was able to meet goal twice since discharge. Discussed with pt ways to increase protein.  Pt has purchased protein powder to add to liquids.  4.  Have you had nausea?  Tell me about when have experienced nausea and what you did to help? Pt denies nausea.   5.  Has the frequency or color changed with your urine? Pt states that she is urinating "fine" with no changes in frequency or urgency.     6.  Tell me what your incisions look like? "Incisions look fine". Pt denies a fever, chills.  Pt states incisions are not swollen, open, or draining.  Pt encouraged to call [facility] if incisions change.   7.  Have you been passing gas? BM? Pt states that she is having BMs. Last BM 04/05/22.     8.  If a problem or question were to arise who would you call?  Do you know contact numbers for [place], [facility], and [facility]? Pt denies dehydration symptoms.  Pt can describe s/sx of dehydration.  Pt knows to call [facility] for surgical, [facility] for nutrition, and [facility] for non-urgent questions or concerns.   9.  How has the walking going? Pt states she is walking around and able to be active without difficulty.   10. Are you still using your incentive spirometer?  If so, how often? Pt states that she is doing the I.S.   Pt encouraged to use incentive spirometer, at least 10x every hour while awake until she sees the surgeon.  11.  How are your vitamins and calcium  going?  How are you taking them? Pt states that she is taking her supplements and vitamins without difficulty. Reinforced education about taking supplements at least two hours apart.  Reminded patient that the first 30 days post-operatively are important for successful recovery.  Practice good hand hygiene, wearing a mask when appropriate (since optional in most places), and minimizing exposure to people who live outside of the home, especially if they are exhibiting any respiratory, GI, or illness-like symptoms.

## 2022-04-14 ENCOUNTER — Encounter: Payer: Self-pay | Admitting: Dietician

## 2022-04-14 ENCOUNTER — Encounter: Payer: 59 | Attending: General Surgery | Admitting: Dietician

## 2022-04-14 VITALS — Ht 67.5 in | Wt 326.1 lb

## 2022-04-14 DIAGNOSIS — E669 Obesity, unspecified: Secondary | ICD-10-CM | POA: Insufficient documentation

## 2022-04-14 NOTE — Progress Notes (Signed)
2 Week Post-Operative Nutrition Class   Patient was seen on 04/14/2022 for Post-Operative Nutrition education at the [facility].    Surgery date: 03/31/2022 Surgery type: RYGB   Anthropometrics  Start weight at [facility]: 347.2 lbs (date: 01/26/2022)  Height: 67.5 in Weight today: 326.1 lb BMI: 50.32 kg/m2     Clinical  Medical hx: Obesity, GERD, depression, anxiety, thyroid disease Medications: Probiotic, biotin, Vit D, Gabapentin, Nexium, levothyroxine, metformin HCL XR, bupropion HCL XL, Lexapro, prazosin, aripiprazole, Murelax  Labs: Triglycerides 240; Vit D 26.9; A1C 5.8; glucose 113 Notable signs/symptoms: none noted Any previous deficiencies? No Bowel Habits: Every day to every other day no complaints   Body Composition Scale 04/14/2022  Current Body Weight 326.1  Total Body Fat % 49.1  Visceral Fat 19  Fat-Free Mass % 50.8   Total Body Water % 39.9  Muscle-Mass lbs 35.4  BMI 50.8  Body Fat Displacement          Torso  lbs 99.4         Left Leg  lbs 19.8         Right Leg  lbs 19.8         Left Arm  lbs 9.9         Right Arm   lbs 9.9      The following the learning objectives were met by the patient during this course: Identifies Phase 3 (Soft, High Proteins) Dietary Goals and will begin from 2 weeks post-operatively to 2 months post-operatively Identifies appropriate sources of fluids and proteins  Identifies appropriate fat sources and healthy verses unhealthy fat types   States protein recommendations and appropriate sources post-operatively Identifies the need for appropriate texture modifications, mastication, and bite sizes when consuming solids Identifies appropriate fat consumption and sources Identifies appropriate multivitamin and calcium sources post-operatively Describes the need for physical activity post-operatively and will follow MD recommendations States when to call healthcare provider regarding medication questions or  post-operative complications   Handouts given during class include: Phase 3A: Soft, High Protein Diet Handout Phase 3 High Protein Meals Healthy Fats   Follow-Up Plan: Patient will follow-up at [facility] in 6 weeks for 2 month post-op nutrition visit for diet advancement per MD.

## 2022-05-04 ENCOUNTER — Other Ambulatory Visit: Payer: Self-pay

## 2022-05-04 ENCOUNTER — Emergency Department (HOSPITAL_COMMUNITY)
Admission: EM | Admit: 2022-05-04 | Discharge: 2022-05-05 | Disposition: A | Discharge status: Home / Self Care | Payer: 59 | Attending: Emergency Medicine | Admitting: Emergency Medicine

## 2022-05-04 ENCOUNTER — Encounter (HOSPITAL_COMMUNITY): Payer: Self-pay

## 2022-05-04 ENCOUNTER — Emergency Department (HOSPITAL_COMMUNITY): Payer: 59

## 2022-05-04 DIAGNOSIS — Z79899 Other long term (current) drug therapy: Secondary | ICD-10-CM | POA: Diagnosis not present

## 2022-05-04 DIAGNOSIS — R1031 Right lower quadrant pain: Secondary | ICD-10-CM | POA: Diagnosis present

## 2022-05-04 DIAGNOSIS — Z91040 Latex allergy status: Secondary | ICD-10-CM | POA: Diagnosis not present

## 2022-05-04 DIAGNOSIS — E039 Hypothyroidism, unspecified: Secondary | ICD-10-CM | POA: Diagnosis not present

## 2022-05-04 DIAGNOSIS — D72829 Elevated white blood cell count, unspecified: Secondary | ICD-10-CM | POA: Diagnosis not present

## 2022-05-04 DIAGNOSIS — R109 Unspecified abdominal pain: Secondary | ICD-10-CM

## 2022-05-04 LAB — COMPREHENSIVE METABOLIC PANEL
ALT: 42 U/L (ref 0–44)
AST: 43 U/L — ABNORMAL HIGH (ref 15–41)
Albumin: 4 g/dL (ref 3.5–5.0)
Alkaline Phosphatase: 75 U/L (ref 38–126)
Anion gap: 10 (ref 5–15)
BUN: 11 mg/dL (ref 6–20)
CO2: 20 mmol/L — ABNORMAL LOW (ref 22–32)
Calcium: 9 mg/dL (ref 8.9–10.3)
Chloride: 106 mmol/L (ref 98–111)
Creatinine, Ser: 0.84 mg/dL (ref 0.44–1.00)
GFR, Estimated: >60 mL/min (ref >60)
Glucose, Bld: 110 mg/dL — ABNORMAL HIGH (ref 70–99)
Potassium: 3.7 mmol/L (ref 3.5–5.1)
Sodium: 136 mmol/L (ref 135–145)
Total Bilirubin: 1 mg/dL (ref 0.3–1.2)
Total Protein: 7.8 g/dL (ref 6.5–8.1)

## 2022-05-04 LAB — CBC WITH DIFFERENTIAL/PLATELET
Abs Immature Granulocytes: 0.02 10*3/uL (ref 0.00–0.07)
Basophils Absolute: 0.1 10*3/uL (ref 0.0–0.1)
Basophils Relative: 1 %
Eosinophils Absolute: 0.3 10*3/uL (ref 0.0–0.5)
Eosinophils Relative: 4 %
HCT: 46.1 % — ABNORMAL HIGH (ref 36.0–46.0)
Hemoglobin: 15.2 g/dL — ABNORMAL HIGH (ref 12.0–15.0)
Immature Granulocytes: 0 %
Lymphocytes Relative: 31 %
Lymphs Abs: 2.7 10*3/uL (ref 0.7–4.0)
MCH: 31.1 pg (ref 26.0–34.0)
MCHC: 33 g/dL (ref 30.0–36.0)
MCV: 94.3 fL (ref 80.0–100.0)
Monocytes Absolute: 0.7 10*3/uL (ref 0.1–1.0)
Monocytes Relative: 8 %
Neutro Abs: 4.9 10*3/uL (ref 1.7–7.7)
Neutrophils Relative %: 56 %
Platelets: 200 10*3/uL (ref 150–400)
RBC: 4.89 MIL/uL (ref 3.87–5.11)
RDW: 13.7 % (ref 11.5–15.5)
WBC: 8.7 10*3/uL (ref 4.0–10.5)
nRBC: 0 % (ref 0.0–0.2)

## 2022-05-04 LAB — I-STAT BETA HCG BLOOD, ED (MC, WL, AP ONLY): I-stat hCG, quantitative: <5 m[IU]/mL (ref <5)

## 2022-05-04 MED ORDER — SODIUM CHLORIDE 0.9 % IV BOLUS
1000.0000 mL | Freq: Once | INTRAVENOUS | Status: AC
  Administered 2022-05-05: 1000 mL via INTRAVENOUS

## 2022-05-04 MED ORDER — ONDANSETRON HCL 4 MG/2ML IJ SOLN
4.0000 mg | Freq: Once | INTRAMUSCULAR | Status: AC
  Administered 2022-05-05: 4 mg via INTRAVENOUS
  Filled 2022-05-04: qty 2

## 2022-05-04 NOTE — ED Provider Triage Note (Signed)
Emergency Medicine Provider Triage Evaluation Note  Janice Wolf , a 42 y.o. female  was evaluated in triage.  Pt complains of sudden onset sharp stabbing pain in her right flank. Had mild pain in her right flank last night. She has had associated nausea and feels "woozy." No recent dysuria or hematuria. Denies vaginal discharge. Recent gastric bypass about 5 weeks ago.   Review of Systems  Positive: See above Negative:   Physical Exam  BP 111/76 (BP Location: Right Arm)   Pulse 64   Temp (!) 97.5 F (36.4 C) (Oral)   Resp 14   SpO2 95%  Gen:   Awake, no distress   Resp:  Normal effort  MSK:   Moves extremities without difficulty  Other:  Right CVA tenderness   Medical Decision Making  Medically screening exam initiated at 4:59 PM.  Appropriate orders placed.  Janice Wolf was informed that the remainder of the evaluation will be completed by another provider, this initial triage assessment does not replace that evaluation, and the importance of remaining in the ED until their evaluation is complete.     Janice Hillier, PA-C 05/04/22 1702

## 2022-05-04 NOTE — ED Triage Notes (Signed)
Pt BIB EMS from home. Pt had a gastric bypass 5 weeks ago. Pt c/o sudden onset right flank pain radiating down to her groin.  20G L AC 100 mcg of fentanyl given by EMS Pain increases with movement.  60 HR 140/96 97% RA

## 2022-05-05 DIAGNOSIS — R1031 Right lower quadrant pain: Secondary | ICD-10-CM | POA: Diagnosis not present

## 2022-05-05 LAB — URINALYSIS, ROUTINE W REFLEX MICROSCOPIC
Bilirubin Urine: NEGATIVE
Glucose, UA: NEGATIVE mg/dL
Ketones, ur: 5 mg/dL — AB
Nitrite: NEGATIVE
Protein, ur: 100 mg/dL — AB
Specific Gravity, Urine: 1.03 (ref 1.005–1.030)
pH: 5 (ref 5.0–8.0)

## 2022-05-05 MED ORDER — KETOROLAC TROMETHAMINE 15 MG/ML IJ SOLN
15.0000 mg | Freq: Once | INTRAMUSCULAR | Status: AC
  Administered 2022-05-05: 15 mg via INTRAVENOUS
  Filled 2022-05-05: qty 1

## 2022-05-05 MED ORDER — METHYLPREDNISOLONE 4 MG PO TBPK: ORAL_TABLET | ORAL | 0 refills | Status: DC

## 2022-05-05 MED ORDER — HYDROMORPHONE HCL 1 MG/ML IJ SOLN
1.0000 mg | Freq: Once | INTRAMUSCULAR | Status: AC
  Administered 2022-05-05: 1 mg via INTRAVENOUS
  Filled 2022-05-05: qty 1

## 2022-05-05 MED ORDER — OXYCODONE-ACETAMINOPHEN 5-325 MG PO TABS: 1.0000 | ORAL_TABLET | Freq: Four times a day (QID) | ORAL | 0 refills | Status: DC | PRN

## 2022-05-05 MED ORDER — MORPHINE SULFATE (PF) 4 MG/ML IV SOLN
4.0000 mg | Freq: Once | INTRAVENOUS | Status: AC
  Administered 2022-05-05: 4 mg via INTRAVENOUS
  Filled 2022-05-05: qty 1

## 2022-05-05 MED ORDER — METHOCARBAMOL 500 MG PO TABS: 500.0000 mg | ORAL_TABLET | Freq: Two times a day (BID) | ORAL | 0 refills | Status: DC

## 2022-05-05 MED ORDER — METHOCARBAMOL 500 MG PO TABS
500.0000 mg | ORAL_TABLET | Freq: Once | ORAL | Status: AC
  Administered 2022-05-05: 500 mg via ORAL
  Filled 2022-05-05: qty 1

## 2022-05-05 MED ORDER — CEPHALEXIN 500 MG PO CAPS: 500.0000 mg | ORAL_CAPSULE | Freq: Three times a day (TID) | ORAL | 0 refills | Status: DC

## 2022-05-05 MED ORDER — ONDANSETRON HCL 4 MG/2ML IJ SOLN: 4.0000 mg | Freq: Once | INTRAMUSCULAR | Status: DC

## 2022-05-05 NOTE — ED Notes (Signed)
2L applied for Spo2 84% on RA after morphine. Pt states this is common for her in the hospital and that she typically uses a cpap at home

## 2022-05-05 NOTE — Discharge Instructions (Signed)
You were seen today for flank pain.  Many of the things suggest that this is likely musculoskeletal.  You may have a UTI.  Urine culture sent.  Take medications as prescribed.  Do not drive while taking narcotic pain medication.

## 2022-05-05 NOTE — ED Notes (Signed)
Pt able to tolerate small sips of water

## 2022-05-05 NOTE — ED Provider Notes (Signed)
[facility] DEPT Provider Note   CSN: [id number] Arrival date & time: 05/04/22  1646     History  Chief Complaint  Patient presents with   Flank Pain    Janice Wolf is a 42 y.o. female.  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  Patient reports acute onset of right flank pain that radiated into her right groin.  She reports that it was sharp.  No urinary symptoms although she states she has only dribbled over the last several days.  Denies fevers.  No history of kidney stones.  Reports nausea without vomiting.  Did not take anything for her pain.  Home Medications Prior to Admission medications   Medication Sig Start Date End Date Taking? Authorizing Provider  cephALEXin (KEFLEX) 500 MG capsule Take 1 capsule (500 mg total) by mouth 3 (three) times daily. 05/05/22  Yes Briseyda Fehr, Barbette Hair, MD  methocarbamol (ROBAXIN) 500 MG tablet Take 1 tablet (500 mg total) by mouth 2 (two) times daily. 05/05/22  Yes Ruthell Feigenbaum, Barbette Hair, MD  methylPREDNISolone (MEDROL DOSEPAK) 4 MG TBPK tablet As directed on package. 05/05/22  Yes Orvel Cutsforth, Barbette Hair, MD  oxyCODONE-acetaminophen (PERCOCET/ROXICET) 5-325 MG tablet Take 1 tablet by mouth every 6 (six) hours as needed for severe pain. 05/05/22  Yes Heidemarie Goodnow, Barbette Hair, MD  Armodafinil 50 MG tablet Take 50 mg by mouth daily. 03/11/22   [provider]  clindamycin (CLEOCIN T) 1 % lotion Apply 1 Application topically daily as needed (bumps in vaginal area). 01/27/22   [provider]  cyclobenzaprine (FLEXERIL) 10 MG tablet Take 10 mg by mouth 3 (three) times daily as needed for muscle spasms.    [provider]  esomeprazole (NEXIUM) 20 MG capsule Take 20 mg by mouth daily.    [provider]  fluticasone (FLONASE) 50 MCG/ACT nasal spray Place 1 spray into both nostrils daily as needed for allergies. 01/05/22   [provider]   gabapentin (NEURONTIN) 300 MG capsule Take 300 mg by mouth 2 (two) times daily. 04/24/19   [provider]  levonorgestrel (MIRENA) 20 MCG/24HR IUD 1 each by Intrauterine route once.    [provider]  levothyroxine (SYNTHROID) 100 MCG tablet Take 100 mcg by mouth daily before breakfast.    [provider]  lidocaine (XYLOCAINE) 5 % ointment 1 Application daily as needed (Foot pain). 03/17/19   [provider]  nystatin cream (MYCOSTATIN) Apply 1 Application topically daily as needed (fever blisters). 01/09/22   [provider]  ondansetron (ZOFRAN-ODT) 4 MG disintegrating tablet Take 1 tablet (4 mg total) by mouth every 6 (six) hours as needed for nausea or vomiting. 04/02/22   Greer Pickerel, MD  oxyCODONE (OXY IR/ROXICODONE) 5 MG immediate release tablet Take 1 tablet (5 mg total) by mouth every 6 (six) hours as needed for severe pain. 04/02/22   Greer Pickerel, MD  prazosin (MINIPRESS) 2 MG capsule Take 2 mg by mouth 3 (three) times daily. 01/15/22   [provider]  Probiotic Product (DIGESTIVE ADVANTAGE GUMMIES PO) Take 2 mg by mouth daily.    [provider]  traZODone (DESYREL) 50 MG tablet Take 50 mg by mouth at bedtime as needed for sleep.    [provider]  valACYclovir (VALTREX) 1000 MG tablet Take 2,000 mg by mouth daily as needed (Cold sores). 01/04/22   [provider]  Allergies    Aspirin, Latex, and Other    Review of Systems   Review of Systems  Constitutional:  Negative for fever.  Genitourinary:  Positive for flank pain. Negative for dysuria.  All other systems reviewed and are negative.   Physical Exam Updated Vital Signs BP 113/73   Pulse (!) 57   Temp 97.9 F (36.6 C)   Resp 18   SpO2 93%  Physical Exam Vitals and nursing note reviewed.  Constitutional:      Appearance: She is well-developed. She is obese. She is not ill-appearing.  HENT:     Head: Normocephalic and atraumatic.   Eyes:     Pupils: Pupils are equal, round, and reactive to light.  Cardiovascular:     Rate and Rhythm: Normal rate and regular rhythm.     Heart sounds: Normal heart sounds.  Pulmonary:     Effort: Pulmonary effort is normal. No respiratory distress.     Breath sounds: No wheezing.  Abdominal:     General: Bowel sounds are normal.     Palpations: Abdomen is soft.     Tenderness: There is no abdominal tenderness. There is right CVA tenderness. There is no guarding or rebound.     Comments: Abdominal incisions clean dry and intact  Musculoskeletal:     Cervical back: Neck supple.  Skin:    General: Skin is warm and dry.  Neurological:     Mental Status: She is alert and oriented to person, place, and time.  Psychiatric:        Mood and Affect: Mood normal.     ED Results / Procedures / Treatments   Labs (all labs ordered are listed, but only abnormal results are displayed) Labs Reviewed  COMPREHENSIVE METABOLIC PANEL - Abnormal; Notable for the following components:      Result Value   CO2 20 (*)    Glucose, Bld 110 (*)    AST 43 (*)    All other components within normal limits  CBC WITH DIFFERENTIAL/PLATELET - Abnormal; Notable for the following components:   Hemoglobin 15.2 (*)    HCT 46.1 (*)    All other components within normal limits  URINALYSIS, ROUTINE W REFLEX MICROSCOPIC - Abnormal; Notable for the following components:   Color, Urine AMBER (*)    APPearance CLOUDY (*)    Hgb urine dipstick SMALL (*)    Ketones, ur 5 (*)    Protein, ur 100 (*)    Leukocytes,Ua TRACE (*)    Bacteria, UA MANY (*)    All other components within normal limits  URINE CULTURE  I-STAT BETA HCG BLOOD, ED ([facility], [facility], [facility] ONLY)    EKG None  Radiology CT Renal Stone Study  Result Date: 05/04/2022 CLINICAL DATA:  Right flank pain radiating to the groin. EXAM: CT ABDOMEN AND PELVIS WITHOUT CONTRAST TECHNIQUE: Multidetector CT imaging of the abdomen and pelvis was performed following  the standard protocol without IV contrast. RADIATION DOSE REDUCTION: This exam was performed according to the departmental dose-optimization program which includes automated exposure control, adjustment of the mA and/or kV according to patient size and/or use of iterative reconstruction technique. COMPARISON:  CT November 25, 2021. FINDINGS: Lower chest: No acute abnormality. Hepatobiliary: Diffuse hepatic steatosis. Gallbladder surgically absent. No biliary ductal dilation. Pancreas: No pancreatic ductal dilation or evidence of acute inflammation. Spleen: No splenomegaly. Adrenals/Urinary Tract: Bilateral adrenal glands appear normal. Hydronephrosis. No renal, ureteral or bladder calculi. Urinary bladder is unremarkable for degree of distension.  Stomach/Bowel: Postsurgical change of Roux-en-Y gastric bypass. No pathologic dilation of large or small bowel. No evidence of acute bowel inflammation. Vascular/Lymphatic: Normal caliber abdominal aorta. No pathologically enlarged abdominal or pelvic lymph nodes. Reproductive: Intrauterine device appears appropriate in positioning. Left ovarian cyst measures 3.3 cm, considered benign requiring no independent imaging follow-up. Other: No significant abdominopelvic free fluid. 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 Multilevel degenerative changes spine. Mild degenerative change of the bilateral hips. IMPRESSION: 1. No acute abnormality in the abdomen or pelvis. Specifically, no evidence of obstructive uropathy. 2. Diffuse hepatic steatosis. 3. Postsurgical change of Roux-en-Y gastric bypass. 4. 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 Electronically Signed   By: Dahlia Bailiff M.D.   On: 05/04/2022 19:25    Procedures Procedures    Medications Ordered in ED Medications  ondansetron (ZOFRAN) injection 4 mg (0 mg Intravenous Hold 05/05/22 0147)  ondansetron (ZOFRAN) injection  4 mg (4 mg Intravenous Given 05/05/22 0125)  sodium chloride 0.9 % bolus 1,000 mL (0 mLs Intravenous Stopped 05/05/22 0303)  morphine (PF) 4 MG/ML injection 4 mg (4 mg Intravenous Given 05/05/22 0144)  ketorolac (TORADOL) 15 MG/ML injection 15 mg (15 mg Intravenous Given 05/05/22 0144)  HYDROmorphone (DILAUDID) injection 1 mg (1 mg Intravenous Given 05/05/22 0540)  methocarbamol (ROBAXIN) tablet 500 mg (500 mg Oral Given 05/05/22 5056)    ED Course/ Medical Decision Making/ A&P Clinical Course as of 05/05/22 0642  Tue May 05, 2022  0520 Reports ongoing pain.  Minimal improvement with Toradol and morphine.  Reports pain is positional.  Could be musculoskeletal in nature.  Additional pain medication ordered. [CH]    Clinical Course User Index [CH] Manasvi Dickard, Barbette Hair, MD                           Medical Decision Making Amount and/or Complexity of Data Reviewed Labs: ordered.  Risk Prescription drug management.   This patient presents to the ED for concern of flank pain, this involves an extensive number of treatment options, and is a complaint that carries with it a high risk of complications and morbidity.  I considered the following differential and admission for this acute, potentially life threatening condition.  The differential diagnosis includes kidney stone, UTI, musculoskeletal pain, less likely shingles given absence of rash  MDM:    This is a 42 year old female with recent history of gastric bypass who presents with flank pain.  She is overall nontoxic but she is uncomfortable appearing.  There are some features of her history and physical exam that suggest musculoskeletal etiology.  Otherwise kidney stones is high on the differential.  She does report some urinary symptoms.  No leukocytosis.  No renal dysfunction.  She has small leukocyte Estrace, 6-10 white cells and many bacteria in her urine.  The specimen does appear somewhat dirty.  Will send culture.  CT stone study obtained and  shows no evidence of acute obstructing stone.  There is no other significant findings.  Given positional nature of the pain, would favor musculoskeletal etiology.  She is having some urinary symptoms, so would cover with Keflex until urine culture results.  Will discharge with Medrol Dosepak, Robaxin, Percocet, and Keflex.  Patient is ambulatory independently.  (Labs, imaging, consults)  Labs: I Ordered, and personally interpreted labs.  The pertinent results include: CBC, BMP, urinalysis  Imaging Studies ordered: I ordered imaging  studies including CT stone study I independently visualized and interpreted imaging. I agree with the radiologist interpretation  Additional history obtained from husband at bedside.  External records from outside source obtained and reviewed including prior evaluations  Cardiac Monitoring: The patient was maintained on a cardiac monitor.  I personally viewed and interpreted the cardiac monitored which showed an underlying rhythm of: Sinus rhythm  Reevaluation: After the interventions noted above, I reevaluated the patient and found that they have :improved  Social Determinants of Health:  lives independently  Disposition: Discharge  Co morbidities that complicate the patient evaluation  Past Medical History:  Diagnosis Date   Abnormal thyroid blood test    no treatment yet, per pt.   Anxiety    Arthritis    Back pain    Complication of anesthesia    was hard to wake up after T & A 12/2015; had to stay overnight   Dental crowns present    Depression    Dizziness    Fatty liver    Gallbladder problem    GERD (gastroesophageal reflux disease)    Hypothyroidism    IBS (irritable bowel syndrome)    Migraines    migraines   Neuromuscular disorder (HCC)    nerve damage in foot   Painful orthopaedic hardware ([place]) 09/2016   left foot   Pneumonia    PONV (postoperative nausea and vomiting)    Sleep apnea    uses cpap   Stuffy nose 09/03/2016    Vitamin D deficiency      Medicines Meds ordered this encounter  Medications   ondansetron (ZOFRAN) injection 4 mg   sodium chloride 0.9 % bolus 1,000 mL   morphine (PF) 4 MG/ML injection 4 mg   ondansetron (ZOFRAN) injection 4 mg   ketorolac (TORADOL) 15 MG/ML injection 15 mg   HYDROmorphone (DILAUDID) injection 1 mg   methocarbamol (ROBAXIN) tablet 500 mg   methocarbamol (ROBAXIN) 500 MG tablet    Sig: Take 1 tablet (500 mg total) by mouth 2 (two) times daily.    Dispense:  20 tablet    Refill:  0   methylPREDNISolone (MEDROL DOSEPAK) 4 MG TBPK tablet    Sig: As directed on package.    Dispense:  21 tablet    Refill:  0   oxyCODONE-acetaminophen (PERCOCET/ROXICET) 5-325 MG tablet    Sig: Take 1 tablet by mouth every 6 (six) hours as needed for severe pain.    Dispense:  12 tablet    Refill:  0   cephALEXin (KEFLEX) 500 MG capsule    Sig: Take 1 capsule (500 mg total) by mouth 3 (three) times daily.    Dispense:  9 capsule    Refill:  0    I have reviewed the patients home medicines and have made adjustments as needed  Problem List / ED Course: Problem List Items Addressed This Visit   None Visit Diagnoses     Flank pain    -  Primary                   Final Clinical Impression(s) / ED Diagnoses Final diagnoses:  Flank pain    Rx / DC Orders ED Discharge Orders          Ordered    methocarbamol (ROBAXIN) 500 MG tablet  2 times daily        05/05/22 0641    methylPREDNISolone (MEDROL DOSEPAK) 4 MG TBPK tablet  05/05/22 0641    oxyCODONE-acetaminophen (PERCOCET/ROXICET) 5-325 MG tablet  Every 6 hours PRN        05/05/22 0641    cephALEXin (KEFLEX) 500 MG capsule  3 times daily        05/05/22 3709              Chozen Latulippe, Barbette Hair, MD 05/05/22 [phone]

## 2022-05-06 LAB — URINE CULTURE: Culture: <10000 — AB

## 2022-05-26 ENCOUNTER — Encounter: Payer: Self-pay | Admitting: Dietician

## 2022-05-26 ENCOUNTER — Other Ambulatory Visit: Payer: Self-pay | Admitting: Student

## 2022-05-26 ENCOUNTER — Encounter: Payer: 59 | Attending: General Surgery | Admitting: Dietician

## 2022-05-26 VITALS — Ht 67.5 in | Wt 302.7 lb

## 2022-05-26 DIAGNOSIS — E669 Obesity, unspecified: Secondary | ICD-10-CM | POA: Insufficient documentation

## 2022-05-26 DIAGNOSIS — E860 Dehydration: Secondary | ICD-10-CM

## 2022-05-26 NOTE — Progress Notes (Signed)
Bariatric Nutrition Follow-Up Visit Medical Nutrition Therapy  Appt Start Time: 2:30   End Time: 2:59  Surgery date: 03/31/2022 Surgery type: RYGB  NUTRITION ASSESSMENT   Anthropometrics  Start weight at [facility]: 347.2 lbs (date: 01/26/2022)  Height: 67.5 in Weight today: 302.7 lb BMI: 46.71 kg/m2     Clinical  Medical hx: Obesity, GERD, depression, anxiety, thyroid disease Medications: Probiotic, biotin, Vit D, Gabapentin, Nexium, levothyroxine, metformin HCL XR, bupropion HCL XL, Lexapro, prazosin, aripiprazole, Murelax  Labs: Triglycerides 240; Vit D 26.9; A1C 5.8; glucose 113 Notable signs/symptoms: none noted Any previous deficiencies? No Bowel Habits: Every day to every other day no complaints   Body Composition Scale 04/14/2022 05/26/2022  Current Body Weight 326.1 302.7  Total Body Fat % 49.1 47.5  Visceral Fat 19 17  Fat-Free Mass % 50.8 52.4   Total Body Water % 39.9 40.7  Muscle-Mass lbs 35.4 35.2  BMI 50.8 47.1  Body Fat Displacement           Torso  lbs 99.4 89.4         Left Leg  lbs 19.8 17.8         Right Leg  lbs 19.8 17.8         Left Arm  lbs 9.9 8.9         Right Arm   lbs 9.9 8.9     Lifestyle & Dietary Hx  Pt states she had a bad muscle spasm that sent her to the ED, stating that was the worst pain she has ever felt.  Pt states she is doing well.  Estimated daily fluid intake: 64 oz Estimated daily protein intake: 50-60 g Supplements: multivitamin and calcium Current average weekly physical activity: not since she hurt her back   24-Hr Dietary Recall First Meal: protein shake Snack: cheese  Second Meal: chicken Snack: cheese or deli meat  Third Meal: chicken or eggs Snack:  Beverages: Powerade Zero, zero sugar tea, water with flavorings, water, ice  Post-Op Goals/ Signs/ Symptoms Using straws: no Drinking while eating: no Chewing/swallowing difficulties: no Changes in vision: no Changes to mood/headaches: no Hair loss/changes to  skin/nails: no Difficulty focusing/concentrating: no Sweating: no Limb weakness: no Dizziness/lightheadedness: no Palpitations: no  Carbonated/caffeinated beverages: no N/V/D/C/Gas: no Abdominal pain: no Dumping syndrome: no    NUTRITION DIAGNOSIS  Overweight/obesity ([place]-3.3) related to past poor dietary habits and physical inactivity as evidenced by completed bariatric surgery and following dietary guidelines for continued weight loss and healthy nutrition status.     NUTRITION INTERVENTION Nutrition counseling (C-1) and education (E-2) to facilitate bariatric surgery goals, including: Diet advancement to the next phase now including all vegetables and fruit. The importance of consuming adequate calories as well as certain nutrients daily due to the body's need for essential vitamins, minerals, and fats The importance of daily physical activity and to reach a goal of at least 150 minutes of moderate to vigorous physical activity weekly (or as directed by their physician) due to benefits such as increased musculature and improved lab values The importance of intuitive eating specifically learning hunger-satiety cues and understanding the importance of learning a new body: The importance of mindful eating to avoid grazing behaviors  Why you need complex carbohydrates: Complex carbohydrates are required to have a healthy diet. They provide fiber which can help with blood glucose levels and help keep you satiated. Fruits and starchy vegetables provide essential vitamins and minerals required for immune function, eyesight support, brain support, bone density, wound  healing and many other functions within the body. According to the current evidenced based 2020-2025 Dietary Guidelines for Americans, complex carbohydrates are part of a healthy eating pattern which is associated with a decreased risk for type 2 diabetes, cancers, and cardiovascular disease.  Purpose of hydration: Water makes up over  50% of your total body water, and is part of many organs throughout the body. Water is essential to transport digested nutrients, regulate body temperature, rid the body of waste products, and protects joints and the spinal cord. When not properly hydrated you will begin to experience headaches, cramps and dizziness. Further dehydration can result in rapid heart rate, shock, oliguria, and may cause seizures.  https://www.merckmanuals.com/home/hormonal-and-metabolic-disordehttps://www.usgs.gov/special-topic/water-science-school/science/water-you-water-and-human-body?qt-science_center_objects=0#qt-science_center_objectsrs/water-balance/about-body-water HistoricalGrowth.gl https://www.stevens.org/ PimpTShirt.fi https://www.health.InvestmentBrowse.at  Goals -Continue: getting 64 oz of fluids, mostly plain water  Handouts Provided Include  Vegetables List/ Fruit List Bariatric My Plate  Learning Style & Readiness for Change Teaching method utilized: Visual & Auditory  Demonstrated degree of understanding via: Teach Back  Readiness Level: preparation Barriers to learning/adherence to lifestyle change: back injury/spasms  RD's Notes for Next Visit Assess adherence to pt chosen goals  MONITORING & EVALUATION Dietary intake, weekly physical activity, body weight.  Next Steps Patient is to follow-up in 4 months for 6 month post-op follow-up.

## 2022-05-26 NOTE — Progress Notes (Signed)
Orders placed for IV fluids BMP also ordered

## 2022-05-27 ENCOUNTER — Ambulatory Visit (INDEPENDENT_AMBULATORY_CARE_PROVIDER_SITE_OTHER): Payer: 59

## 2022-05-27 DIAGNOSIS — E860 Dehydration: Secondary | ICD-10-CM | POA: Diagnosis not present

## 2022-05-27 MED ORDER — THIAMINE HCL 100 MG/ML IJ SOLN
Freq: Once | INTRAVENOUS | Status: AC
  Filled 2022-05-27: qty 1000

## 2022-05-27 MED ORDER — SODIUM CHLORIDE 0.9 % IV BOLUS
1000.0000 mL | Freq: Once | INTRAVENOUS | Status: AC
  Administered 2022-05-27: 1000 mL via INTRAVENOUS

## 2022-05-27 NOTE — Progress Notes (Signed)
Diagnosis: Dehydration  Provider:  Marshell Garfinkel MD  Procedure: Infusion  IV Type: Peripheral, IV Location: R Upper Arm  Banana Bag, Dose: 1000 ml  Infusion Start Time: 8335  Infusion Stop Time: 1434   Normal Saline, Dose: 1000 ml  Infusion Start Time: 8251  Infusion Stop Time: 8984  Post Infusion IV Care: Peripheral IV Discontinued  Discharge: Condition: Good, Destination: Home . AVS provided to patient.   Performed by:  Cleophus Molt, RN

## 2022-06-10 ENCOUNTER — Other Ambulatory Visit: Payer: Self-pay | Admitting: General Surgery

## 2022-06-10 DIAGNOSIS — R109 Unspecified abdominal pain: Secondary | ICD-10-CM

## 2022-06-12 ENCOUNTER — Ambulatory Visit
Admission: RE | Admit: 2022-06-12 | Discharge: 2022-06-12 | Disposition: A | Discharge status: Home / Self Care | Payer: 59 | Source: Ambulatory Visit | Attending: General Surgery | Admitting: General Surgery

## 2022-06-12 DIAGNOSIS — R109 Unspecified abdominal pain: Secondary | ICD-10-CM

## 2022-06-12 DIAGNOSIS — E860 Dehydration: Secondary | ICD-10-CM | POA: Insufficient documentation

## 2022-06-12 LAB — CBC WITH DIFFERENTIAL/PLATELET
Abs Immature Granulocytes: 0.05 10*3/uL (ref 0.00–0.07)
Basophils Absolute: 0.1 10*3/uL (ref 0.0–0.1)
Basophils Relative: 1 %
Eosinophils Absolute: 0.4 10*3/uL (ref 0.0–0.5)
Eosinophils Relative: 4 %
HCT: 46 % (ref 36.0–46.0)
Hemoglobin: 15.4 g/dL — ABNORMAL HIGH (ref 12.0–15.0)
Immature Granulocytes: 1 %
Lymphocytes Relative: 33 %
Lymphs Abs: 3.4 10*3/uL (ref 0.7–4.0)
MCH: 30.9 pg (ref 26.0–34.0)
MCHC: 33.5 g/dL (ref 30.0–36.0)
MCV: 92.2 fL (ref 80.0–100.0)
Monocytes Absolute: 0.7 10*3/uL (ref 0.1–1.0)
Monocytes Relative: 7 %
Neutro Abs: 5.7 10*3/uL (ref 1.7–7.7)
Neutrophils Relative %: 54 %
Platelets: 331 10*3/uL (ref 150–400)
RBC: 4.99 MIL/uL (ref 3.87–5.11)
RDW: 13.2 % (ref 11.5–15.5)
WBC: 10.3 10*3/uL (ref 4.0–10.5)
nRBC: 0 % (ref 0.0–0.2)

## 2022-06-12 LAB — COMPREHENSIVE METABOLIC PANEL
ALT: 48 U/L — ABNORMAL HIGH (ref 0–44)
AST: 50 U/L — ABNORMAL HIGH (ref 15–41)
Albumin: 3.6 g/dL (ref 3.5–5.0)
Alkaline Phosphatase: 90 U/L (ref 38–126)
Anion gap: 11 (ref 5–15)
BUN: 7 mg/dL (ref 6–20)
CO2: 23 mmol/L (ref 22–32)
Calcium: 8.9 mg/dL (ref 8.9–10.3)
Chloride: 98 mmol/L (ref 98–111)
Creatinine, Ser: 0.86 mg/dL (ref 0.44–1.00)
GFR, Estimated: >60 mL/min (ref >60)
Glucose, Bld: 109 mg/dL — ABNORMAL HIGH (ref 70–99)
Potassium: 3.3 mmol/L — ABNORMAL LOW (ref 3.5–5.1)
Sodium: 132 mmol/L — ABNORMAL LOW (ref 135–145)
Total Bilirubin: 1.1 mg/dL (ref 0.3–1.2)
Total Protein: 7.6 g/dL (ref 6.5–8.1)

## 2022-06-12 MED ORDER — THIAMINE HCL 100 MG/ML IJ SOLN
Freq: Once | INTRAVENOUS | Status: AC
  Filled 2022-06-12: qty 1000

## 2022-06-12 MED ORDER — SODIUM CHLORIDE (PF) 0.9 % IJ SOLN
INTRAMUSCULAR | Status: AC
  Filled 2022-06-12: qty 10

## 2022-06-12 MED ORDER — SODIUM CHLORIDE 0.9 % IV BOLUS
1000.0000 mL | Freq: Once | INTRAVENOUS | Status: AC
  Administered 2022-06-12: 1000 mL via INTRAVENOUS

## 2022-06-23 ENCOUNTER — Encounter (HOSPITAL_BASED_OUTPATIENT_CLINIC_OR_DEPARTMENT_OTHER): Payer: Self-pay

## 2022-06-23 ENCOUNTER — Observation Stay (HOSPITAL_BASED_OUTPATIENT_CLINIC_OR_DEPARTMENT_OTHER)
Admission: EM | Admit: 2022-06-23 | Discharge: 2022-06-25 | Disposition: A | Discharge status: Home / Self Care | Payer: 59 | Attending: Internal Medicine | Admitting: Internal Medicine

## 2022-06-23 ENCOUNTER — Other Ambulatory Visit: Payer: Self-pay

## 2022-06-23 ENCOUNTER — Other Ambulatory Visit (HOSPITAL_BASED_OUTPATIENT_CLINIC_OR_DEPARTMENT_OTHER): Payer: Self-pay

## 2022-06-23 ENCOUNTER — Emergency Department (HOSPITAL_BASED_OUTPATIENT_CLINIC_OR_DEPARTMENT_OTHER): Payer: 59

## 2022-06-23 DIAGNOSIS — E039 Hypothyroidism, unspecified: Secondary | ICD-10-CM

## 2022-06-23 DIAGNOSIS — Z91040 Latex allergy status: Secondary | ICD-10-CM | POA: Diagnosis not present

## 2022-06-23 DIAGNOSIS — Z7952 Long term (current) use of systemic steroids: Secondary | ICD-10-CM | POA: Diagnosis not present

## 2022-06-23 DIAGNOSIS — G629 Polyneuropathy, unspecified: Secondary | ICD-10-CM

## 2022-06-23 DIAGNOSIS — I2699 Other pulmonary embolism without acute cor pulmonale: Secondary | ICD-10-CM | POA: Diagnosis present

## 2022-06-23 DIAGNOSIS — I2694 Multiple subsegmental pulmonary emboli without acute cor pulmonale: Secondary | ICD-10-CM

## 2022-06-23 DIAGNOSIS — K219 Gastro-esophageal reflux disease without esophagitis: Secondary | ICD-10-CM

## 2022-06-23 DIAGNOSIS — I2693 Single subsegmental pulmonary embolism without acute cor pulmonale: Secondary | ICD-10-CM | POA: Diagnosis not present

## 2022-06-23 DIAGNOSIS — Z79899 Other long term (current) drug therapy: Secondary | ICD-10-CM | POA: Insufficient documentation

## 2022-06-23 DIAGNOSIS — R109 Unspecified abdominal pain: Secondary | ICD-10-CM

## 2022-06-23 LAB — CBC WITH DIFFERENTIAL/PLATELET
Abs Immature Granulocytes: 0.02 10*3/uL (ref 0.00–0.07)
Basophils Absolute: 0.1 10*3/uL (ref 0.0–0.1)
Basophils Relative: 1 %
Eosinophils Absolute: 0.4 10*3/uL (ref 0.0–0.5)
Eosinophils Relative: 3 %
HCT: 47.6 % — ABNORMAL HIGH (ref 36.0–46.0)
Hemoglobin: 16.2 g/dL — ABNORMAL HIGH (ref 12.0–15.0)
Immature Granulocytes: 0 %
Lymphocytes Relative: 41 %
Lymphs Abs: 4.7 10*3/uL — ABNORMAL HIGH (ref 0.7–4.0)
MCH: 31.2 pg (ref 26.0–34.0)
MCHC: 34 g/dL (ref 30.0–36.0)
MCV: 91.5 fL (ref 80.0–100.0)
Monocytes Absolute: 0.8 10*3/uL (ref 0.1–1.0)
Monocytes Relative: 7 %
Neutro Abs: 5.5 10*3/uL (ref 1.7–7.7)
Neutrophils Relative %: 48 %
Platelets: 311 10*3/uL (ref 150–400)
RBC: 5.2 MIL/uL — ABNORMAL HIGH (ref 3.87–5.11)
RDW: 13.9 % (ref 11.5–15.5)
WBC: 11.6 10*3/uL — ABNORMAL HIGH (ref 4.0–10.5)
nRBC: 0 % (ref 0.0–0.2)

## 2022-06-23 LAB — PREGNANCY, URINE: Preg Test, Ur: NEGATIVE

## 2022-06-23 LAB — COMPREHENSIVE METABOLIC PANEL
ALT: 51 U/L — ABNORMAL HIGH (ref 0–44)
AST: 43 U/L — ABNORMAL HIGH (ref 15–41)
Albumin: 4.3 g/dL (ref 3.5–5.0)
Alkaline Phosphatase: 85 U/L (ref 38–126)
Anion gap: 10 (ref 5–15)
BUN: 8 mg/dL (ref 6–20)
CO2: 23 mmol/L (ref 22–32)
Calcium: 9.5 mg/dL (ref 8.9–10.3)
Chloride: 106 mmol/L (ref 98–111)
Creatinine, Ser: 0.76 mg/dL (ref 0.44–1.00)
GFR, Estimated: >60 mL/min (ref >60)
Glucose, Bld: 96 mg/dL (ref 70–99)
Potassium: 4 mmol/L (ref 3.5–5.1)
Sodium: 139 mmol/L (ref 135–145)
Total Bilirubin: 0.7 mg/dL (ref 0.3–1.2)
Total Protein: 7.2 g/dL (ref 6.5–8.1)

## 2022-06-23 LAB — URINALYSIS, ROUTINE W REFLEX MICROSCOPIC
Bilirubin Urine: NEGATIVE
Glucose, UA: NEGATIVE mg/dL
Ketones, ur: NEGATIVE mg/dL
Nitrite: NEGATIVE
Protein, ur: NEGATIVE mg/dL
Specific Gravity, Urine: 1.02 (ref 1.005–1.030)
pH: 5 (ref 5.0–8.0)

## 2022-06-23 LAB — HEPARIN LEVEL (UNFRACTIONATED): Heparin Unfractionated: 0.62 IU/mL (ref 0.30–0.70)

## 2022-06-23 LAB — LIPASE, BLOOD: Lipase: 30 U/L (ref 11–51)

## 2022-06-23 MED ORDER — HEPARIN BOLUS VIA INFUSION
6000.0000 [IU] | Freq: Once | INTRAVENOUS | Status: AC
  Administered 2022-06-23: 6000 [IU] via INTRAVENOUS

## 2022-06-23 MED ORDER — MAGNESIUM HYDROXIDE 400 MG/5ML PO SUSP: 30.0000 mL | Freq: Every day | ORAL | Status: DC | PRN

## 2022-06-23 MED ORDER — MODAFINIL 200 MG PO TABS
100.0000 mg | ORAL_TABLET | Freq: Every day | ORAL | Status: DC
  Filled 2022-06-23: qty 1

## 2022-06-23 MED ORDER — ONDANSETRON HCL 4 MG/2ML IJ SOLN: 4.0000 mg | Freq: Four times a day (QID) | INTRAMUSCULAR | Status: DC | PRN

## 2022-06-23 MED ORDER — IOHEXOL 350 MG/ML SOLN
100.0000 mL | Freq: Once | INTRAVENOUS | Status: AC | PRN
  Administered 2022-06-23: 100 mL via INTRAVENOUS

## 2022-06-23 MED ORDER — ACETAMINOPHEN 325 MG PO TABS
650.0000 mg | ORAL_TABLET | Freq: Four times a day (QID) | ORAL | Status: DC | PRN
  Administered 2022-06-24 – 2022-06-25 (×3): 650 mg via ORAL
  Filled 2022-06-23 (×3): qty 2

## 2022-06-23 MED ORDER — PANTOPRAZOLE SODIUM 40 MG PO TBEC
40.0000 mg | DELAYED_RELEASE_TABLET | Freq: Every day | ORAL | Status: DC
  Administered 2022-06-23 – 2022-06-25 (×3): 40 mg via ORAL
  Filled 2022-06-23 (×3): qty 1

## 2022-06-23 MED ORDER — GABAPENTIN 300 MG PO CAPS
300.0000 mg | ORAL_CAPSULE | Freq: Two times a day (BID) | ORAL | Status: DC
  Filled 2022-06-23 (×4): qty 1

## 2022-06-23 MED ORDER — ADULT MULTIVITAMIN W/MINERALS CH
1.0000 | ORAL_TABLET | Freq: Every day | ORAL | Status: DC
  Administered 2022-06-23 – 2022-06-25 (×3): 1 via ORAL
  Filled 2022-06-23 (×4): qty 1

## 2022-06-23 MED ORDER — LEVOTHYROXINE SODIUM 100 MCG PO TABS
100.0000 ug | ORAL_TABLET | Freq: Every day | ORAL | Status: DC
  Administered 2022-06-24 – 2022-06-25 (×2): 100 ug via ORAL
  Filled 2022-06-23 (×2): qty 1

## 2022-06-23 MED ORDER — ACETAMINOPHEN 650 MG RE SUPP: 650.0000 mg | Freq: Four times a day (QID) | RECTAL | Status: DC | PRN

## 2022-06-23 MED ORDER — FLUTICASONE PROPIONATE 50 MCG/ACT NA SUSP: 1.0000 | Freq: Every day | NASAL | Status: DC | PRN

## 2022-06-23 MED ORDER — HEPARIN (PORCINE) 25000 UT/250ML-% IV SOLN
1600.0000 [IU]/h | INTRAVENOUS | Status: DC
  Administered 2022-06-23 – 2022-06-24 (×2): 1600 [IU]/h via INTRAVENOUS
  Filled 2022-06-23 (×2): qty 250

## 2022-06-23 MED ORDER — ONDANSETRON HCL 4 MG PO TABS: 4.0000 mg | ORAL_TABLET | Freq: Four times a day (QID) | ORAL | Status: DC | PRN

## 2022-06-23 MED ORDER — SODIUM CHLORIDE 0.9 % IV SOLN: INTRAVENOUS | Status: DC

## 2022-06-23 MED ORDER — TIZANIDINE HCL 2 MG PO TABS
2.0000 mg | ORAL_TABLET | Freq: Two times a day (BID) | ORAL | Status: DC
  Administered 2022-06-23 – 2022-06-25 (×3): 2 mg via ORAL
  Filled 2022-06-23 (×4): qty 1

## 2022-06-23 MED ORDER — ARMODAFINIL 50 MG PO TABS: 50.0000 mg | ORAL_TABLET | Freq: Every day | ORAL | Status: DC

## 2022-06-23 MED ORDER — TRAZODONE HCL 50 MG PO TABS: 25.0000 mg | ORAL_TABLET | Freq: Every evening | ORAL | Status: DC | PRN

## 2022-06-23 NOTE — ED Notes (Signed)
Given ice water and peanut butter cracker

## 2022-06-23 NOTE — Assessment & Plan Note (Signed)
-   We will continue Synthroid. 

## 2022-06-23 NOTE — Progress Notes (Signed)
Plan of Care Note for accepted transfer  Patient: Janice Wolf    [id number]  DOA: 06/23/2022     Nursing staff, Please call [facility] number on Amion as soon as patient's arrival to the unit (not the listed attending) so that the appropriate admitting provider can evaluate the pt. ASAP to avoid any delay in care.  Facility requesting transfer: [facility] Requesting Provider: Dr. Jeanell Sparrow Reason for transfer: Admission for PE Facility course: Presented to hospital with complaints of 1 month of right-sided flank pain worsening with breathing.  Also report of some swelling on the right side. 11/28 underwent laparoscopic Roux-en-Y gastric bypass surgery 05/04/2022 seen in ED for right flank pain radiating down her groin workup in the ED showed unremarkable CT renal. 2/20 present to [facility] ER with similar complaints of right-sided flank pain now pleuritic in nature.  CT abdomen pelvis ordered which shows evidence of omental infarct.  CT angio chest PE ordered which shows evidence of central PE with equivocal right heart strain. Per EDP, patient currently not hypoxic, nontoxic appearance.  Pain well-controlled primarily on deep breathing.  Plan of care: The patient is accepted for admission to Telemetry unit, at [facility]. Will check echocardiogram and if unremarkable and remains stable overnight likely can be continued to transition to oral anticoagulation with plan for close follow-up with general surgery on 3/5.  Author: Berle Mull, MD  06/23/2022  Check [email or web address] for on-call coverage.

## 2022-06-23 NOTE — ED Notes (Signed)
Called [facility] -- informed that the patient Bed Assignment is Ready

## 2022-06-23 NOTE — Assessment & Plan Note (Signed)
-   The patient will be admitted to a medical telemetry observation bed. - We will continue IV heparin. - 2D echo be obtained to assess for RV strain. - Bilateral lower extremity venous Doppler will be obtained to rule out DVT. - O2 protocol will be followed.  She has not been hypoxic.

## 2022-06-23 NOTE — Assessment & Plan Note (Signed)
-   We will continue Neurontin. ?

## 2022-06-23 NOTE — H&P (Signed)
[place]   PATIENT NAME: Janice Wolf    MR#:  [id number]  DATE OF BIRTH:  1981-02-03  DATE OF ADMISSION:  06/23/2022  PRIMARY CARE PHYSICIAN: Jonathon Jordan, MD   Patient is coming from: Home  REQUESTING/REFERRING PHYSICIAN: [facility]- Dr. Jeanell Sparrow   CHIEF COMPLAINT:   Chief Complaint  Patient presents with   Flank Pain    HISTORY OF PRESENT ILLNESS:  Janice Wolf is a 42 y.o. Caucasian female with medical history significant for GERD, depression, osteoarthritis, anxiety, hypothyroidism, IBS, migraine, and fatty liver, who presented to the emergency room with acute onset of right flank pain which has been going on since January of and increasing with deep breathing.  She had a gastric bypass in November and since then had 3 different visits which she had to go in to be evaluated for dyspnea worsening with deep inspiration.  She feels like she has fluid stuck on her right side per her report.  She lost 60 pounds so far since her Roux-en-Y gastric bypass surgery.  She was under the impression that the back pain was due to weight loss.  She has an appointment for surgical follow-up with Dr. Redmond Pulling on March 4.  She denies any chest pain or palpitations.  No cough or wheezing or hemoptysis.  No bleeding diathesis.  No nausea or vomiting or abdominal pain.  No dysuria, oliguria or hematuria.  ED Course: She came to the ED vital signs were within normal.  Labs revealed an AST 43 and ALT 51 close to previous levels.  CBC showed mild leukocytosis of 11.6 and hemoconcentration.  UA was unremarkable. EKG as reviewed by me : Normal sinus rhythm with a rate of 61 with T wave inversion in V1. Imaging: Abdominal and pelvic CT scan revealed the following: 1. Area of stranding in the omentum deep to the umbilicus likely a small area of omental infarct, benign side present previously but perhaps slightly more conspicuous. No fluid in the area or other concerning findings. 2.  Hepatomegaly with mild splenomegaly and severe hepatic steatosis with fissural widening and caudate hypertrophy should be correlated with any clinical or laboratory evidence of liver disease. Hepatic morphology suggests the possibility of early cirrhosis. 3. Post gastric bypass. 4. Pulmonary embolism better seen on dedicated CT angiography of the chest. Previously communicated to emergency department staff as outlined in the CT angiogram report.  Chest CTA revealed the following 1. Central segmental RIGHT lower lobe emboli to medial and posterior segments just beyond the lobar level. 2. Straightening of the interventricular septum. Correlate with any signs of RIGHT heart strain by echocardiography as warranted. RV to LV ratio is less than 1.0. 3. Small pleural effusion on the RIGHT. 4. See dedicated imaging of the abdomen and pelvis for further details regarding abdomen pelvis findings. 5. Hepatic steatosis and cirrhotic hepatic morphology with splenomegaly in this patient post gastric bypass. See dedicated abdominal imaging for further detail.  The patient was started on IV heparin with bolus and drip.  She is directly admitted to a telemetry observation bed for further evaluation and management. PAST MEDICAL HISTORY:   Past Medical History:  Diagnosis Date   Abnormal thyroid blood test    no treatment yet, per pt.   Anxiety    Arthritis    Back pain    Complication of anesthesia    was hard to wake up after T & A 12/2015; had to stay overnight   Dental crowns present  Depression    Dizziness    Fatty liver    Gallbladder problem    GERD (gastroesophageal reflux disease)    Hypothyroidism    IBS (irritable bowel syndrome)    Migraines    migraines   Neuromuscular disorder ([place])    nerve damage in foot   Painful orthopaedic hardware ([place]) 09/2016   left foot   Pneumonia    PONV (postoperative nausea and vomiting)    Sleep apnea    uses cpap   Stuffy nose  09/03/2016   Vitamin D deficiency     PAST SURGICAL HISTORY:   Past Surgical History:  Procedure Laterality Date   BIOPSY  02/03/2022   Procedure: BIOPSY;  Surgeon: Ronnette Juniper, MD;  Location: [facility];  Service: Gastroenterology;;   CHOLECYSTECTOMY     age 89   COLONOSCOPY WITH PROPOFOL N/A 02/03/2022   Procedure: COLONOSCOPY WITH PROPOFOL;  Surgeon: Ronnette Juniper, MD;  Location: [facility];  Service: Gastroenterology;  Laterality: N/A;   ESOPHAGOGASTRODUODENOSCOPY N/A 02/03/2022   Procedure: ESOPHAGOGASTRODUODENOSCOPY (EGD);  Surgeon: Ronnette Juniper, MD;  Location: [facility];  Service: Gastroenterology;  Laterality: N/A;   GASTRIC ROUX-EN-Y N/A 03/31/2022   Procedure: LAPAROSCOPIC ROUX-EN-Y GASTRIC BYPASS WITH UPPER GASTROINTESTINAL ENDOSCOPY;  Surgeon: Greer Pickerel, MD;  Location: [facility];  Service: General;  Laterality: N/A;   HARDWARE REMOVAL Left 10/01/2016   Procedure: HARDWARE REMOVAL left foot;  Surgeon: [name], MD;  Location: [facility];  Service: Orthopedics;  Laterality: Left;   TARSAL METATARSAL ARTHRODESIS Left 02/20/2016   Procedure: FIRST AND SECOND TARSAL METATARSAL ARTHRODESIS;  Surgeon: [name], MD;  Location: [facility];  Service: Orthopedics;  Laterality: Left;   TONSILLECTOMY AND ADENOIDECTOMY  12/2015    SOCIAL HISTORY:   Social History   Tobacco Use   Smoking status: Never   Smokeless tobacco: Never  Substance Use Topics   Alcohol use: No    FAMILY HISTORY:   Family History  Problem Relation Age of Onset   Diabetes Mother    Hypertension Mother    Anxiety disorder Mother    Obesity Father    Hyperlipidemia Father    Hypertension Father    Diabetes Father     DRUG ALLERGIES:   Allergies  Allergen Reactions   Aspirin Nausea And Vomiting   Latex Other (See Comments)    SKIN IRRITATION   Other Rash    PERFUMES    REVIEW OF SYSTEMS:   ROS As per history of present illness. All pertinent systems  were reviewed above. Constitutional, HEENT, cardiovascular, respiratory, GI, GU, musculoskeletal, neuro, psychiatric, endocrine, integumentary and hematologic systems were reviewed and are otherwise negative/unremarkable except for positive findings mentioned above in the HPI.   MEDICATIONS AT HOME:   Prior to Admission medications   Medication Sig Start Date End Date Taking? Authorizing Provider  Armodafinil 50 MG tablet Take 50 mg by mouth daily. 03/11/22  Yes [provider]  CALCIUM PO Take 3 tablets by mouth daily.   Yes [provider]  clindamycin (CLEOCIN T) 1 % lotion Apply 1 Application topically daily as needed (bumps in vaginal area). 01/27/22  Yes [provider]  esomeprazole (NEXIUM) 20 MG capsule Take 20 mg by mouth daily.   Yes [provider]  fluticasone (FLONASE) 50 MCG/ACT nasal spray Place 1 spray into both nostrils daily as needed for allergies. 01/05/22  Yes [provider]  gabapentin (NEURONTIN) 300 MG capsule Take 300 mg by mouth 2 (two)  times daily. 04/24/19  Yes [provider]  levothyroxine (SYNTHROID) 100 MCG tablet Take 100 mcg by mouth daily before breakfast.   Yes [provider]  lidocaine (XYLOCAINE) 5 % ointment 1 Application daily as needed (Foot pain). 03/17/19  Yes [provider]  Multiple Vitamin (MULTIVITAMIN) tablet Take 2 tablets by mouth daily.   Yes [provider]  nystatin cream (MYCOSTATIN) Apply 1 Application topically daily as needed (fever blisters). 01/09/22  Yes [provider]  ondansetron (ZOFRAN-ODT) 4 MG disintegrating tablet Take 1 tablet (4 mg total) by mouth every 6 (six) hours as needed for nausea or vomiting. 04/02/22  Yes Greer Pickerel, MD  tiZANidine (ZANAFLEX) 2 MG tablet Take 2 mg by mouth 2 (two) times daily. 06/05/22  Yes [provider]  cephALEXin (KEFLEX) 500 MG capsule Take 1 capsule (500 mg total) by mouth 3 (three) times  daily. Patient not taking: Reported on 06/23/2022 05/05/22   Horton, Barbette Hair, MD  levonorgestrel (MIRENA) 20 MCG/24HR IUD 1 each by Intrauterine route once.    [provider]  methocarbamol (ROBAXIN) 500 MG tablet Take 1 tablet (500 mg total) by mouth 2 (two) times daily. Patient not taking: Reported on 06/23/2022 05/05/22   Horton, Barbette Hair, MD  methylPREDNISolone (MEDROL DOSEPAK) 4 MG TBPK tablet As directed on package. Patient not taking: Reported on 06/23/2022 05/05/22   Horton, Barbette Hair, MD  oxyCODONE (OXY IR/ROXICODONE) 5 MG immediate release tablet Take 1 tablet (5 mg total) by mouth every 6 (six) hours as needed for severe pain. Patient not taking: Reported on 06/23/2022 04/02/22   Greer Pickerel, MD  oxyCODONE-acetaminophen (PERCOCET/ROXICET) 5-325 MG tablet Take 1 tablet by mouth every 6 (six) hours as needed for severe pain. Patient not taking: Reported on 06/23/2022 05/05/22   Horton, Barbette Hair, MD      VITAL SIGNS:  Blood pressure (!) 143/94, pulse 67, temperature 98.4 F (36.9 C), temperature source Oral, resp. rate 20, SpO2 99 %.  PHYSICAL EXAMINATION:  Physical Exam  GENERAL:  42 y.o.-year-old Caucasian female patient lying in the bed with no acute distress.  EYES: Pupils equal, round, reactive to light and accommodation. No scleral icterus. Extraocular muscles intact.  HEENT: Head atraumatic, normocephalic. Oropharynx and nasopharynx clear.  NECK:  Supple, no jugular venous distention. No thyroid enlargement, no tenderness.  LUNGS: Normal breath sounds bilaterally, no wheezing, rales,rhonchi or crepitation. No use of accessory muscles of respiration.  CARDIOVASCULAR: Regular rate and rhythm, S1, S2 normal. No murmurs, rubs, or gallops.  ABDOMEN: Soft, nondistended, nontender. Bowel sounds present. No organomegaly or mass.  EXTREMITIES: No pedal edema, cyanosis, or clubbing.  NEUROLOGIC: Cranial nerves II through XII are intact. Muscle strength 5/5 in all extremities.  Sensation intact. Gait not checked.  PSYCHIATRIC: The patient is alert and oriented x 3.  Normal affect and good eye contact. SKIN: No obvious rash, lesion, or ulcer.   LABORATORY PANEL:   CBC Recent Labs  Lab 06/23/22 1150  WBC 11.6*  HGB 16.2*  HCT 47.6*  PLT 311   ------------------------------------------------------------------------------------------------------------------  Chemistries  Recent Labs  Lab 06/23/22 1150  NA 139  K 4.0  CL 106  CO2 23  GLUCOSE 96  BUN 8  CREATININE 0.76  CALCIUM 9.5  AST 43*  ALT 51*  ALKPHOS 85  BILITOT 0.7   ------------------------------------------------------------------------------------------------------------------  Cardiac Enzymes No results for input(s): "TROPONINI" in the last 168 hours. ------------------------------------------------------------------------------------------------------------------  RADIOLOGY:  CT ABDOMEN PELVIS W CONTRAST  Result Date: 06/23/2022 CLINICAL  DATA:  Acute abdominal pain RIGHT flank pain ongoing since January. History of gastric bypass and dehydration. EXAM: CT ABDOMEN AND PELVIS WITH CONTRAST TECHNIQUE: Multidetector CT imaging of the abdomen and pelvis was performed using the standard protocol following bolus administration of intravenous contrast. RADIATION DOSE REDUCTION: This exam was performed according to the departmental dose-optimization program which includes automated exposure control, adjustment of the mA and/or kV according to patient size and/or use of iterative reconstruction technique. CONTRAST:  158m OMNIPAQUE IOHEXOL 350 MG/ML SOLN COMPARISON:  CT of the chest of June 23, 2022 FINDINGS: Lower chest: See dedicated chest CT for further detail regarding pulmonary emboli. Hepatobiliary: Severe hepatic steatosis with focal fat about the fissure for falciform ligament. Caudate hypertrophy and nodular contour with fissural widening. Post cholecystectomy. No signs of biliary duct  dilation. No suspicious hepatic lesion. Portal vein is patent. Pancreas: Normal, without mass, inflammation or ductal dilatation. Spleen: Mild splenomegaly measuring nearly 13 cm greatest axial dimension. Adrenals/Urinary Tract: Adrenal glands are unremarkable. Symmetric renal enhancement. No sign of hydronephrosis. No suspicious renal lesion or perinephric stranding. Urinary bladder is grossly unremarkable. Urinary bladder with limited assessment due to under distension. Stomach/Bowel: Small hiatal hernia post gastric bypass procedure with Roux limb anterior to the transverse colon. No stranding adjacent to the excluded stomach or gastric pouch. No signs of bowel obstruction. Normal appendix. Vascular/Lymphatic: Aorta with smooth contours. IVC with smooth contours. No aneurysmal dilation of the abdominal aorta. There is no gastrohepatic or hepatoduodenal ligament lymphadenopathy. No retroperitoneal or mesenteric lymphadenopathy. No pelvic sidewall lymphadenopathy. Reproductive: IUD in-situ. Otherwise unremarkable appearance of reproductive structures. Other: No ascites. Area of stranding in the omentum deep to the umbilicus 4.3 x 1.6 cm suggested on previous imaging perhaps slightly more conspicuous though contrast is utilized on today's study and was not utilized on the prior exam. Musculoskeletal: No acute bone finding. No destructive bone process. Spinal degenerative changes. IMPRESSION: 1. Area of stranding in the omentum deep to the umbilicus likely a small area of omental infarct, benign side present previously but perhaps slightly more conspicuous. No fluid in the area or other concerning findings. 2. Hepatomegaly with mild splenomegaly and severe hepatic steatosis with fissural widening and caudate hypertrophy should be correlated with any clinical or laboratory evidence of liver disease. Hepatic morphology suggests the possibility of early cirrhosis. 3. Post gastric bypass. 4. Pulmonary embolism better  seen on dedicated CT angiography of the chest. Previously communicated to emergency department staff as outlined in the CT angiogram report. Electronically Signed   By: GZetta BillsM.D.   On: 06/23/2022 14:22   CT Angio Chest PE W and/or Wo Contrast  Result Date: 06/23/2022 CLINICAL DATA:  Elevated D-dimer in a.m. patient with RIGHT flank pain since January. EXAM: CT ANGIOGRAPHY CHEST WITH CONTRAST TECHNIQUE: Multidetector CT imaging of the chest was performed using the standard protocol during bolus administration of intravenous contrast. Multiplanar CT image reconstructions and MIPs were obtained to evaluate the vascular anatomy. RADIATION DOSE REDUCTION: This exam was performed according to the departmental dose-optimization program which includes automated exposure control, adjustment of the mA and/or kV according to patient size and/or use of iterative reconstruction technique. CONTRAST:  1038mOMNIPAQUE IOHEXOL 350 MG/ML SOLN COMPARISON:  Only radiographs are available for comparison. FINDINGS: Cardiovascular: Normal caliber of the thoracic aorta. No acute aortic process to the extent evaluated on this study which is performed for evaluation of the pulmonary arterial system. Heart size is mildly enlarged. No pericardial effusion or sign of pericardial  nodularity. Central pulmonary artery is opacified at 240 Hounsfield units. Segmental level emboli just beyond lobar level in the RIGHT lower lobe (image 58/4) also with segmental level and subsegmental emboli elsewhere in the RIGHT lower lobe. No central or lobar embolism. RV to LV ratio less than 1.0. Mediastinum/Nodes: No thoracic inlet, axillary, mediastinal or hilar adenopathy. Esophagus grossly normal. Lungs/Pleura: No dense consolidative changes. No sign of pleural effusion on the LEFT. Small pleural effusion on the RIGHT. Airways are patent. Upper Abdomen: See dedicated imaging of the abdomen and pelvis for further details regarding abdomen pelvis  findings. There are changes of a [place] steatosis with fissural widening and caudate hypertrophy with nodular hepatic contour. Post cholecystectomy. Splenomegaly. Musculoskeletal: No chest wall abnormality. No acute bone finding. No destructive bone process. Review of the MIP images confirms the above findings. IMPRESSION: 1. Central segmental RIGHT lower lobe emboli to medial and posterior segments just beyond the lobar level. 2. Straightening of the interventricular septum. Correlate with any signs of RIGHT heart strain by echocardiography as warranted. RV to LV ratio is less than 1.0. 3. Small pleural effusion on the RIGHT. 4. See dedicated imaging of the abdomen and pelvis for further details regarding abdomen pelvis findings. 5. Hepatic steatosis and cirrhotic hepatic morphology with splenomegaly in this patient post gastric bypass. See dedicated abdominal imaging for further detail. Critical Value/emergent results were called by telephone at the time of interpretation on 06/23/2022 at 2:13 pm to provider [facility] , who verbally acknowledged these results. Electronically Signed   By: Zetta Bills M.D.   On: 06/23/2022 14:13      IMPRESSION AND PLAN:  Assessment and Plan: * Pulmonary embolism ([place]) - The patient will be admitted to a medical telemetry observation bed. - We will continue IV heparin. - 2D echo be obtained to assess for RV strain. - Bilateral lower extremity venous Doppler will be obtained to rule out DVT. - O2 protocol will be followed.  She has not been hypoxic.   Hypothyroidism - We will continue Synthroid.  GERD without esophagitis - We will continue  PPI and H2 blocker therapy.  Peripheral neuropathy - We will continue Neurontin.   DVT prophylaxis: IV heparin drip. Advanced Care Planning:  Code Status: full code. Family Communication:  The plan of care was discussed in details with the patient (and family). I answered all questions. The patient agreed to proceed  with the above mentioned plan. Further management will depend upon hospital course. Disposition Plan: Back to previous home environment Consults called: none. All the records are reviewed and case discussed with ED provider.  Status is: Observation  I certify that at the time of admission, it is my clinical judgment that the patient will require  hospital care extending less than 2 midnights.                            Dispo: The patient is from: Home              Anticipated d/c is to: Home              Patient currently is not medically stable to d/c.              Difficult to place patient: No  Christel Mormon M.D on 06/23/2022 at 8:53 PM  [facility]   From 7 PM-7 AM, contact night-coverage [email or web address]  CC: Primary care physician; Jonathon Jordan, MD

## 2022-06-23 NOTE — ED Triage Notes (Signed)
Pt arrived POV, ambulatory, caox4, c/o R flank pain which has been ongoing since January. Pt reports she hasn't been urinating much but that she has had ongoing dehydration as well, denies any additional urinary s/s.

## 2022-06-23 NOTE — Assessment & Plan Note (Addendum)
-   We will continue  PPI and H2 blocker therapy.

## 2022-06-23 NOTE — Progress Notes (Signed)
ANTICOAGULATION CONSULT NOTE - Initial Consult  Pharmacy Consult for IV heparin Indication: pulmonary embolus  Allergies  Allergen Reactions   Aspirin Nausea And Vomiting   Latex Other (See Comments)    SKIN IRRITATION   Other Rash    PERFUMES    Patient Measurements:   Heparin Dosing Weight: 95 kg (using recent weight 302 lb 05/26/22)   Vital Signs: Temp: 98 F (36.7 C) (02/20 1138) BP: 101/71 (02/20 1400) Pulse Rate: 58 (02/20 1400)  Labs: Recent Labs    06/23/22 1150  HGB 16.2*  HCT 47.6*  PLT 311  CREATININE 0.76    CrCl cannot be calculated (Unknown ideal weight.).   Medical History: Past Medical History:  Diagnosis Date   Abnormal thyroid blood test    no treatment yet, per pt.   Anxiety    Arthritis    Back pain    Complication of anesthesia    was hard to wake up after T & A 12/2015; had to stay overnight   Dental crowns present    Depression    Dizziness    Fatty liver    Gallbladder problem    GERD (gastroesophageal reflux disease)    Hypothyroidism    IBS (irritable bowel syndrome)    Migraines    migraines   Neuromuscular disorder ([place])    nerve damage in foot   Painful orthopaedic hardware ([place]) 09/2016   left foot   Pneumonia    PONV (postoperative nausea and vomiting)    Sleep apnea    uses cpap   Stuffy nose 09/03/2016   Vitamin D deficiency     Assessment: Janice Wolf with PE. No oral anticoagulant PTA. Hgb 16.2, PLT 311. Pharmacy consulted to dose IV heparin.   Goal of Therapy:  Heparin level 0.3-0.7 units/ml Monitor platelets by anticoagulation protocol: Yes   Plan:  Heparin 6,000 IV x 1, then 1,600 units/hr Check 6 hour heparin level Daily heparin level and CBC while on heparin Monitor s/sx bleeding  Eliseo Gum, PharmD PGY1 Pharmacy Resident   06/23/2022  2:46 PM

## 2022-06-23 NOTE — ED Notes (Signed)
Patient transported to CT 

## 2022-06-23 NOTE — ED Provider Notes (Addendum)
[facility] Provider Note   CSN: [id number] Arrival date & time: 06/23/22  1132     History Gastric bypass Chief Complaint  Patient presents with   Flank Pain    Janice Wolf is a 42 y.o. female.  42 y.o female with a PMH of bypass surgery presents to the ED with a chief complaint of right flank pain which has been ongoing since January.  Patient reports she had a gastric bypass in November, has had 3 different visits where she had to go in in order to be further evaluated due to feeling like she is short of breath specially with deep inspiration.  She was evaluated by Dr. Redmond Pulling, they told her that they needed to have her be more hydrated.  She continues to endorse right flank pain, exacerbated with deep inspiration.  She feels like "like there is fluid stuck on that right side ".  She has lost 60 pounds since her gastric bypass surgery, was under the impression that the back pain was due to her losing so much weight.  She feels like she is somewhat swollen on the right side.  She does have an appointment to follow-up with her general surgeon Dr. Redmond Pulling on March 4.  There is no alleviating but pain is exacerbated with lying on the right side.  No chest pain, no fever, no chills.   The history is provided by the patient and medical records.  Flank Pain This is a new problem. Associated symptoms include shortness of breath. Pertinent negatives include no chest pain and no abdominal pain.       Home Medications Prior to Admission medications   Medication Sig Start Date End Date Taking? Authorizing Provider  Armodafinil 50 MG tablet Take 50 mg by mouth daily. 03/11/22   [provider]  cephALEXin (KEFLEX) 500 MG capsule Take 1 capsule (500 mg total) by mouth 3 (three) times daily. 05/05/22   Horton, Barbette Hair, MD  clindamycin (CLEOCIN T) 1 % lotion Apply 1 Application topically daily as needed (bumps in vaginal area). 01/27/22    [provider]  cyclobenzaprine (FLEXERIL) 10 MG tablet Take 10 mg by mouth 3 (three) times daily as needed for muscle spasms.    [provider]  cyclobenzaprine (FLEXERIL) 5 MG tablet Take 5 mg by mouth at bedtime as needed. 05/25/22   [provider]  escitalopram (LEXAPRO) 5 MG tablet Take by mouth. 01/30/22   [provider]  esomeprazole (NEXIUM) 20 MG capsule Take 20 mg by mouth daily.    [provider]  fluticasone (FLONASE) 50 MCG/ACT nasal spray Place 1 spray into both nostrils daily as needed for allergies. 01/05/22   [provider]  gabapentin (NEURONTIN) 300 MG capsule Take 300 mg by mouth 2 (two) times daily. 04/24/19   [provider]  levonorgestrel (MIRENA) 20 MCG/24HR IUD 1 each by Intrauterine route once.    [provider]  levothyroxine (SYNTHROID) 100 MCG tablet Take 100 mcg by mouth daily before breakfast.    [provider]  lidocaine (XYLOCAINE) 5 % ointment 1 Application daily as needed (Foot pain). 03/17/19   [provider]  methocarbamol (ROBAXIN) 500 MG tablet Take 1 tablet (500 mg total) by mouth 2 (two) times daily. 05/05/22   Horton, Barbette Hair, MD  methylPREDNISolone (MEDROL DOSEPAK) 4 MG TBPK tablet As directed on package. 05/05/22   Horton, Barbette Hair, MD  nystatin cream (MYCOSTATIN) Apply 1 Application topically  daily as needed (fever blisters). 01/09/22   [provider]  ondansetron (ZOFRAN-ODT) 4 MG disintegrating tablet Take 1 tablet (4 mg total) by mouth every 6 (six) hours as needed for nausea or vomiting. 04/02/22   Greer Pickerel, MD  oxyCODONE (OXY IR/ROXICODONE) 5 MG immediate release tablet Take 1 tablet (5 mg total) by mouth every 6 (six) hours as needed for severe pain. 04/02/22   Greer Pickerel, MD  oxyCODONE-acetaminophen (PERCOCET/ROXICET) 5-325 MG tablet Take 1 tablet by mouth every 6 (six) hours as needed for severe pain. 05/05/22   Horton, Barbette Hair, MD   prazosin (MINIPRESS) 2 MG capsule Take 2 mg by mouth 3 (three) times daily. 01/15/22   [provider]  Probiotic Product (DIGESTIVE ADVANTAGE GUMMIES PO) Take 2 mg by mouth daily.    [provider]  tiZANidine (ZANAFLEX) 2 MG tablet Take 2 mg by mouth 2 (two) times daily. 06/05/22   [provider]  traZODone (DESYREL) 50 MG tablet Take 50 mg by mouth at bedtime as needed for sleep.    [provider]  valACYclovir (VALTREX) 1000 MG tablet Take 2,000 mg by mouth daily as needed (Cold sores). 01/04/22   [provider]      Allergies    Aspirin, Latex, and Other    Review of Systems   Review of Systems  Constitutional:  Negative for chills and fever.  Respiratory:  Positive for shortness of breath.   Cardiovascular:  Negative for chest pain.  Gastrointestinal:  Positive for nausea. Negative for abdominal pain and vomiting.  Genitourinary:  Positive for flank pain.  Musculoskeletal:  Positive for back pain.  All other systems reviewed and are negative.   Physical Exam Updated Vital Signs BP 109/80   Pulse (!) 56   Temp 98.2 F (36.8 C)   Resp 11   SpO2 100%  Physical Exam Vitals and nursing note reviewed.  Constitutional:      General: She is not in acute distress.    Appearance: She is well-developed.  HENT:     Head: Normocephalic and atraumatic.     Mouth/Throat:     Pharynx: No oropharyngeal exudate.  Eyes:     Pupils: Pupils are equal, round, and reactive to light.  Cardiovascular:     Rate and Rhythm: Regular rhythm.     Heart sounds: Normal heart sounds.  Pulmonary:     Effort: Pulmonary effort is normal. No respiratory distress.     Breath sounds: Normal breath sounds.  Abdominal:     General: Bowel sounds are normal. There is no distension.     Palpations: Abdomen is soft.     Tenderness: There is no abdominal tenderness. There is right CVA tenderness.  Musculoskeletal:        General: No tenderness or deformity.      Cervical back: Normal range of motion.     Right lower leg: No edema.     Left lower leg: No edema.  Skin:    General: Skin is warm and dry.  Neurological:     Mental Status: She is alert and oriented to person, place, and time.     ED Results / Procedures / Treatments   Labs (all labs ordered are listed, but only abnormal results are displayed) Labs Reviewed  CBC WITH DIFFERENTIAL/PLATELET - Abnormal; Notable for the following components:      Result Value   WBC 11.6 (*)    RBC 5.20 (*)    Hemoglobin 16.2 (*)  HCT 47.6 (*)    Lymphs Abs 4.7 (*)    All other components within normal limits  COMPREHENSIVE METABOLIC PANEL - Abnormal; Notable for the following components:   AST 43 (*)    ALT 51 (*)    All other components within normal limits  URINALYSIS, ROUTINE W REFLEX MICROSCOPIC - Abnormal; Notable for the following components:   Hgb urine dipstick MODERATE (*)    Leukocytes,Ua TRACE (*)    Bacteria, UA RARE (*)    All other components within normal limits  LIPASE, BLOOD  PREGNANCY, URINE  HEPARIN LEVEL (UNFRACTIONATED)    EKG None  Radiology CT ABDOMEN PELVIS W CONTRAST  Result Date: 06/23/2022 CLINICAL DATA:  Acute abdominal pain RIGHT flank pain ongoing since January. History of gastric bypass and dehydration. EXAM: CT ABDOMEN AND PELVIS WITH CONTRAST TECHNIQUE: Multidetector CT imaging of the abdomen and pelvis was performed using the standard protocol following bolus administration of intravenous contrast. RADIATION DOSE REDUCTION: This exam was performed according to the departmental dose-optimization program which includes automated exposure control, adjustment of the mA and/or kV according to patient size and/or use of iterative reconstruction technique. CONTRAST:  132m OMNIPAQUE IOHEXOL 350 MG/ML SOLN COMPARISON:  CT of the chest of June 23, 2022 FINDINGS: Lower chest: See dedicated chest CT for further detail regarding pulmonary emboli. Hepatobiliary:  Severe hepatic steatosis with focal fat about the fissure for falciform ligament. Caudate hypertrophy and nodular contour with fissural widening. Post cholecystectomy. No signs of biliary duct dilation. No suspicious hepatic lesion. Portal vein is patent. Pancreas: Normal, without mass, inflammation or ductal dilatation. Spleen: Mild splenomegaly measuring nearly 13 cm greatest axial dimension. Adrenals/Urinary Tract: Adrenal glands are unremarkable. Symmetric renal enhancement. No sign of hydronephrosis. No suspicious renal lesion or perinephric stranding. Urinary bladder is grossly unremarkable. Urinary bladder with limited assessment due to under distension. Stomach/Bowel: Small hiatal hernia post gastric bypass procedure with Roux limb anterior to the transverse colon. No stranding adjacent to the excluded stomach or gastric pouch. No signs of bowel obstruction. Normal appendix. Vascular/Lymphatic: Aorta with smooth contours. IVC with smooth contours. No aneurysmal dilation of the abdominal aorta. There is no gastrohepatic or hepatoduodenal ligament lymphadenopathy. No retroperitoneal or mesenteric lymphadenopathy. No pelvic sidewall lymphadenopathy. Reproductive: IUD in-situ. Otherwise unremarkable appearance of reproductive structures. Other: No ascites. Area of stranding in the omentum deep to the umbilicus 4.3 x 1.6 cm suggested on previous imaging perhaps slightly more conspicuous though contrast is utilized on today's study and was not utilized on the prior exam. Musculoskeletal: No acute bone finding. No destructive bone process. Spinal degenerative changes. IMPRESSION: 1. Area of stranding in the omentum deep to the umbilicus likely a small area of omental infarct, benign side present previously but perhaps slightly more conspicuous. No fluid in the area or other concerning findings. 2. Hepatomegaly with mild splenomegaly and severe hepatic steatosis with fissural widening and caudate hypertrophy should  be correlated with any clinical or laboratory evidence of liver disease. Hepatic morphology suggests the possibility of early cirrhosis. 3. Post gastric bypass. 4. Pulmonary embolism better seen on dedicated CT angiography of the chest. Previously communicated to emergency department staff as outlined in the CT angiogram report. Electronically Signed   By: GZetta BillsM.D.   On: 06/23/2022 14:22   CT Angio Chest PE W and/or Wo Contrast  Result Date: 06/23/2022 CLINICAL DATA:  Elevated D-dimer in a.m. patient with RIGHT flank pain since January. EXAM: CT ANGIOGRAPHY CHEST WITH CONTRAST TECHNIQUE: Multidetector CT imaging  of the chest was performed using the standard protocol during bolus administration of intravenous contrast. Multiplanar CT image reconstructions and MIPs were obtained to evaluate the vascular anatomy. RADIATION DOSE REDUCTION: This exam was performed according to the departmental dose-optimization program which includes automated exposure control, adjustment of the mA and/or kV according to patient size and/or use of iterative reconstruction technique. CONTRAST:  151m OMNIPAQUE IOHEXOL 350 MG/ML SOLN COMPARISON:  Only radiographs are available for comparison. FINDINGS: Cardiovascular: Normal caliber of the thoracic aorta. No acute aortic process to the extent evaluated on this study which is performed for evaluation of the pulmonary arterial system. Heart size is mildly enlarged. No pericardial effusion or sign of pericardial nodularity. Central pulmonary artery is opacified at 240 Hounsfield units. Segmental level emboli just beyond lobar level in the RIGHT lower lobe (image 58/4) also with segmental level and subsegmental emboli elsewhere in the RIGHT lower lobe. No central or lobar embolism. RV to LV ratio less than 1.0. Mediastinum/Nodes: No thoracic inlet, axillary, mediastinal or hilar adenopathy. Esophagus grossly normal. Lungs/Pleura: No dense consolidative changes. No sign of  pleural effusion on the LEFT. Small pleural effusion on the RIGHT. Airways are patent. Upper Abdomen: See dedicated imaging of the abdomen and pelvis for further details regarding abdomen pelvis findings. There are changes of a P[place]ock steatosis with fissural widening and caudate hypertrophy with nodular hepatic contour. Post cholecystectomy. Splenomegaly. Musculoskeletal: No chest wall abnormality. No acute bone finding. No destructive bone process. Review of the MIP images confirms the above findings. IMPRESSION: 1. Central segmental RIGHT lower lobe emboli to medial and posterior segments just beyond the lobar level. 2. Straightening of the interventricular septum. Correlate with any signs of RIGHT heart strain by echocardiography as warranted. RV to LV ratio is less than 1.0. 3. Small pleural effusion on the RIGHT. 4. See dedicated imaging of the abdomen and pelvis for further details regarding abdomen pelvis findings. 5. Hepatic steatosis and cirrhotic hepatic morphology with splenomegaly in this patient post gastric bypass. See dedicated abdominal imaging for further detail. Critical Value/emergent results were called by telephone at the time of interpretation on 06/23/2022 at 2:13 pm [facility] who verbally acknowledged these results. Electronically Signed   By: GZetta BillsM.D.   On: 06/23/2022 14:13    Procedures .Critical Care  Performed by: SJaneece Fitting PA-C Authorized by: SJaneece Fitting PA-C   Critical care provider statement:    Critical care time (minutes):  35   Critical care start time:  06/23/2022 2:30 PM   Critical care end time:  06/23/2022 3:05 PM   Critical care was necessary to treat or prevent imminent or life-threatening deterioration of the following conditions:  Circulatory failure   Critical care was time spent personally by me on the following activities:  Development of treatment plan with patient or surrogate, discussions with consultants, evaluation of  patient's response to treatment, examination of patient, ordering and review of laboratory studies, ordering and review of radiographic studies, ordering and performing treatments and interventions, pulse oximetry, re-evaluation of patient's condition and review of old charts     Medications Ordered in ED Medications  heparin ADULT infusion 100 units/mL (25000 units/2541m (1,600 Units/hr Intravenous New Bag/Given 06/23/22 1538)  iohexol (OMNIPAQUE) 350 MG/ML injection 100 mL (100 mLs Intravenous Contrast Given 06/23/22 1330)  heparin bolus via infusion 6,000 Units (6,000 Units Intravenous Bolus from Bag 06/23/22 1537)    ED Course/ Medical Decision Making/ A&P  Medical Decision Making Amount and/or Complexity of Data Reviewed Labs: ordered. Radiology: ordered.  Risk Prescription drug management. Decision regarding hospitalization.     This patient presents to the ED for concern of right flank pain, this involves a number of treatment options, and is a complaint that carries with it a high risk of complications and morbidity.  The differential diagnosis includes postoperative complication, for lithiasis, pulmonary embolism versus pleural effusion.   Co morbidities: Discussed in HPI   Brief History:  Post gastric bypass from 2 months ago here with ongoing right flank pain which she has had multiple visits to the emergency room for.  Previously had a chest x-ray which did not show any acute findings along with renal study without any findings.  Continues to have right flank pain.  Does have an appointment with Dr. Redmond Pulling on March 4.  She is overall nontoxic-appearing, has been taking medication for her symptoms without much improvement.  EMR reviewed including pt PMHx, past surgical history and past visits to ER.   See HPI for more details   Lab Tests:  I ordered and independently interpreted labs.  The pertinent results include:    I personally  reviewed all laboratory work and imaging. Metabolic panel without any acute abnormality specifically kidney function within normal limits and no significant electrolyte abnormalities. CBC without leukocytosis or significant anemia.   Imaging Studies:  CT Angio chest showed: 1. Central segmental RIGHT lower lobe emboli to medial and posterior  segments just beyond the lobar level.  2. Straightening of the interventricular septum. Correlate with any  signs of RIGHT heart strain by echocardiography as warranted. RV to  LV ratio is less than 1.0.  3. Small pleural effusion on the RIGHT.  4. See dedicated imaging of the abdomen and pelvis for further  details regarding abdomen pelvis findings.  5. Hepatic steatosis and cirrhotic hepatic morphology with  splenomegaly in this patient post gastric bypass. See dedicated  abdominal imaging for further detail.   CT abdomen without any acute findings.   Medicines ordered:  N/A  Critical Interventions:  2:11PM Received call from radiology does and does have a segmental pulmonary embolism in the right lower lobe, unable to determine right heart strain on the measurement is less than 1.  Patient would benefit from echocardiogram. -Patient was started on heparin for pulmonary embolism.  Reevaluation:  After the interventions noted above I re-evaluated patient and found that they have :stayed the same   Social Determinants of Health:  The patient's social determinants of health were a factor in the care of this patient  Problem List / ED Course:  Patient with right flank pain that is been ongoing for the past 2 months.  Did have gastric bypass surgery in the month of November by Dr. Redmond Pulling, reports 3 different visits to primary care physician, specialist in order to be evaluated for this right-sided pain, she reports she was given fluids, told she was likely dehydrated.  She in addition was treated for urinary tract infection, reports she was  treated for likely renal colic but she did not have any improvement in her symptoms.  On today's evaluation, patient continues to complain of right flank pain exacerbated with any deep inspiration feeling like she can actually catch her breath.  Interpretation of her blood work revealed a CBC with mild leukocytosis.  CMP with no electrolyte derangement, creatinine levels unremarkable.  LFTs are slightly elevated but she has a prior history of cholecystectomy without any nausea  or vomiting.  UA with a trace of leukocytes moderate blood but rare bacteria.  I do not suspect urinary tract infection, with patient having no symptoms at this time, perhaps suspicion for renal colic although she has had a scan at the beginning of last month that did not show any kidney stones.  I discussed with her my concern for pulmonary embolism, as patient seems to be worsened when she eats a deep breath.  CT angio chest along with CT abdomen were obtained.  CT abdomen did not show any acute findings, CT angio does show a central pulmonary embolism in the right lower lobe right where patient is having pain.  She is not hypoxic, not tachycardic or tachypneic.  However, read does show some concern for right heart strain as patient has had likely this pulmonary embolism for the past couple of months.  Patient is receiving heparin at this time and I do feel that she warrants admission for echocardiogram. Spoke to Dr. Posey Pronto hospitallist service who will admit patient for further evaluation.   Dispostion:  After consideration of the diagnostic results and the patients response to treatment, I feel that the patent would benefit from admission along with ECHO for further management of PE.     Portions of this note were generated with Lobbyist. Dictation errors may occur despite best attempts at proofreading.   Final Clinical Impression(s) / ED Diagnoses Final diagnoses:  Right flank pain  Single subsegmental  pulmonary embolism without acute [facility] pulmon[facility])    Rx / DC Orders ED Discharge Orders     None         Janeece Fitting, PA-C 06/23/22 Wel[place], Zo L[place]on, PA-C 06/23/22 1648    Pattricia Boss, MD 06/29/22 [phone]

## 2022-06-23 NOTE — Progress Notes (Signed)
ANTICOAGULATION CONSULT NOTE - follow up  Pharmacy Consult for IV heparin Indication: pulmonary embolus  Allergies  Allergen Reactions   Aspirin Nausea And Vomiting   Latex Other (See Comments)    SKIN IRRITATION   Other Rash    PERFUMES    Patient Measurements:   Heparin Dosing Weight: 95 kg (using recent weight 302 lb 05/26/22)   Vital Signs: Temp: 98.4 F (36.9 C) (02/20 1900) Temp Source: Oral (02/20 1900) BP: 143/94 (02/20 1900) Pulse Rate: 67 (02/20 1900)  Labs: Recent Labs    06/23/22 1150 06/23/22 2114  HGB 16.2*  --   HCT 47.6*  --   PLT 311  --   HEPARINUNFRC  --  0.62  CREATININE 0.76  --      CrCl cannot be calculated (Unknown ideal weight.).   Medical History: Past Medical History:  Diagnosis Date   Abnormal thyroid blood test    no treatment yet, per pt.   Anxiety    Arthritis    Back pain    Complication of anesthesia    was hard to wake up after T & A 12/2015; had to stay overnight   Dental crowns present    Depression    Dizziness    Fatty liver    Gallbladder problem    GERD (gastroesophageal reflux disease)    Hypothyroidism    IBS (irritable bowel syndrome)    Migraines    migraines   Neuromuscular disorder ([place])    nerve damage in foot   Painful orthopaedic hardware ([place]) 09/2016   left foot   Pneumonia    PONV (postoperative nausea and vomiting)    Sleep apnea    uses cpap   Stuffy nose 09/03/2016   Vitamin D deficiency     Assessment: 89 YOF with PE. No oral anticoagulant PTA. Hgb 16.2, PLT 311. Pharmacy consulted to dose IV heparin.   1st heparin level 0.62 therapeutic on 1600 units/hr Per RN no bleeding or line issues  Goal of Therapy:  Heparin level 0.3-0.7 units/ml Monitor platelets by anticoagulation protocol: Yes   Plan:  Continue heparin drip at 1600 units/hr Confirmatory heparin with am labs Daily heparin level and CBC while on heparin Monitor s/sx bleeding  Dolly Rias RPh 06/23/2022, 10:21  PM

## 2022-06-23 NOTE — ED Notes (Signed)
[facility] called and informed that they're coming to get this patient

## 2022-06-24 ENCOUNTER — Observation Stay (HOSPITAL_BASED_OUTPATIENT_CLINIC_OR_DEPARTMENT_OTHER): Payer: 59

## 2022-06-24 DIAGNOSIS — I2699 Other pulmonary embolism without acute cor pulmonale: Secondary | ICD-10-CM | POA: Diagnosis not present

## 2022-06-24 DIAGNOSIS — I2694 Multiple subsegmental pulmonary emboli without acute cor pulmonale: Secondary | ICD-10-CM | POA: Diagnosis not present

## 2022-06-24 DIAGNOSIS — I2693 Single subsegmental pulmonary embolism without acute cor pulmonale: Secondary | ICD-10-CM | POA: Diagnosis not present

## 2022-06-24 LAB — BASIC METABOLIC PANEL
Anion gap: 11 (ref 5–15)
BUN: 7 mg/dL (ref 6–20)
CO2: 20 mmol/L — ABNORMAL LOW (ref 22–32)
Calcium: 8.6 mg/dL — ABNORMAL LOW (ref 8.9–10.3)
Chloride: 106 mmol/L (ref 98–111)
Creatinine, Ser: 0.74 mg/dL (ref 0.44–1.00)
GFR, Estimated: >60 mL/min (ref >60)
Glucose, Bld: 91 mg/dL (ref 70–99)
Potassium: 3.6 mmol/L (ref 3.5–5.1)
Sodium: 137 mmol/L (ref 135–145)

## 2022-06-24 LAB — CBC
HCT: 45.9 % (ref 36.0–46.0)
Hemoglobin: 14.5 g/dL (ref 12.0–15.0)
MCH: 30.5 pg (ref 26.0–34.0)
MCHC: 31.6 g/dL (ref 30.0–36.0)
MCV: 96.4 fL (ref 80.0–100.0)
Platelets: 236 10*3/uL (ref 150–400)
RBC: 4.76 MIL/uL (ref 3.87–5.11)
RDW: 13.8 % (ref 11.5–15.5)
WBC: 8.2 10*3/uL (ref 4.0–10.5)
nRBC: 0 % (ref 0.0–0.2)

## 2022-06-24 LAB — ECHOCARDIOGRAM COMPLETE
AV Mean grad: 2 mmHg
AV Peak grad: 3.6 mmHg
Ao pk vel: 0.95 m/s
Area-P 1/2: 4.6 cm2
S' Lateral: 2.8 cm

## 2022-06-24 LAB — HEPARIN LEVEL (UNFRACTIONATED): Heparin Unfractionated: 0.39 IU/mL (ref 0.30–0.70)

## 2022-06-24 LAB — HIV ANTIBODY (ROUTINE TESTING W REFLEX): HIV Screen 4th Generation wRfx: NONREACTIVE

## 2022-06-24 MED ORDER — APIXABAN 5 MG PO TABS: 5.0000 mg | ORAL_TABLET | Freq: Two times a day (BID) | ORAL | Status: DC

## 2022-06-24 MED ORDER — CYCLOBENZAPRINE HCL 5 MG PO TABS: 7.5000 mg | ORAL_TABLET | Freq: Three times a day (TID) | ORAL | Status: DC | PRN

## 2022-06-24 MED ORDER — APIXABAN 5 MG PO TABS
10.0000 mg | ORAL_TABLET | Freq: Two times a day (BID) | ORAL | Status: DC
  Administered 2022-06-24 – 2022-06-25 (×3): 10 mg via ORAL
  Filled 2022-06-24 (×3): qty 2

## 2022-06-24 NOTE — Discharge Instructions (Addendum)
Information on my medicine - ELIQUIS (apixaban)   Why was Eliquis prescribed for you? Eliquis was prescribed to treat blood clots that may have been found in the veins of your legs (deep vein thrombosis) or in your lungs (pulmonary embolism) and to reduce the risk of them occurring again.  What do You need to know about Eliquis ? The starting dose is 10 mg (two 5 mg tablets) taken TWICE daily for the FIRST SEVEN (7) DAYS, then on (enter date)  07/01/22  the dose is reduced to ONE 5 mg tablet taken TWICE daily.  Eliquis may be taken with or without food.   Try to take the dose about the same time in the morning and in the evening. If you have difficulty swallowing the tablet whole please discuss with your pharmacist how to take the medication safely.  Take Eliquis exactly as prescribed and DO NOT stop taking Eliquis without talking to the doctor who prescribed the medication.  Stopping may increase your risk of developing a new blood clot.  Refill your prescription before you run out.  After discharge, you should have regular check-up appointments with your healthcare provider that is prescribing your Eliquis.    What do you do if you miss a dose? If a dose of ELIQUIS is not taken at the scheduled time, take it as soon as possible on the same day and twice-daily administration should be resumed. The dose should not be doubled to make up for a missed dose.  Important Safety Information A possible side effect of Eliquis is bleeding. You should call your healthcare provider right away if you experience any of the following: Bleeding from an injury or your nose that does not stop. Unusual colored urine (red or dark brown) or unusual colored stools (red or black). Unusual bruising for unknown reasons. A serious fall or if you hit your head (even if there is no bleeding).  Some medicines may interact with Eliquis and might increase your risk of bleeding or clotting while on Eliquis. To  help avoid this, consult your healthcare provider or pharmacist prior to using any new prescription or non-prescription medications, including herbals, vitamins, non-steroidal anti-inflammatory drugs (NSAIDs) and supplements.  This website has more information on Eliquis (apixaban): http://[email or web address].com/eliquis/home

## 2022-06-24 NOTE — Progress Notes (Signed)
PROGRESS NOTE  Janice Wolf  [id number] DOB: 03/29/1981 DOA: 06/23/2022 PCP: Jonathon Jordan, MD   Brief Narrative: Patient is a 42 year old female with history of GERD, depression, osteoarthritis, anxiety, hypothyroidism, IBS, migraine, fatty liver who presented with acute onset of right flank pain mainly associated  with deep breathing.  Patient had a gastric bypass surgery in November with loss of 60 pounds so far.  On presentation , her vital signs were stable.  Lab work showed mild leukocytosis.  Abdominal/pelvic CT showed area for stranding in the omentum likely representing area of omental infarct, hepatomegaly with mild splenomegaly and segmental right lower lobe emboli to the medial and posterior segments.  Patient was started on heparin drip.  Assessment & Plan:  Principal Problem:   Pulmonary embolism (HCC) Active Problems:   Hypothyroidism   GERD without esophagitis   Peripheral neuropathy   Acute PE: Presented with right flank pain, shortness of breath.  CT finding as above.  Started on heparin drip.  2D echo showed EF of 60 to 65%, no wall motion, normal left ventricular diastolic parameters, normal right ventricular systolic function and size. Will change IV heparin to Eliquis.  Will also consult PT  Hypothyroidism: Continue Synthyroid  History of GERD with esophagitis: On PPI  History of peripheral neuropathy: On Neurontin      DVT prophylaxis:IV heparin     Code Status: Full Code  Family Communication: Family member at bedside  Patient status:Obs  Patient is from :Home  Anticipated discharge [id number]  Estimated DC date:tomorrow   Consultants: None  Procedures:None  Antimicrobials:  Anti-infectives (From admission, onward)    None       Subjective: Patient seen and examined at bedside today.  Hemodynamically stable.  On room air.  Lies on bed.  Right flank pain is better but she still has some.  No shortness of breath or cough.   Complains of some headache  Objective: Vitals:   06/23/22 1900 06/23/22 2327 06/24/22 0415 06/24/22 0804  BP: (!) 143/94 106/71 (!) 141/83 128/80  Pulse: 67 63 (!) 59 (!) 55  Resp: 20 17 18 16  $ Temp: 98.4 F (36.9 C) 98 F (36.7 C) 97.8 F (36.6 C) 97.9 F (36.6 C)  TempSrc: Oral  Oral Oral  SpO2: 99% 97% 97% 99%    Intake/Output Summary (Last 24 hours) at 06/24/2022 1252 Last data filed at 06/24/2022 1045 Gross per 24 hour  Intake 240 ml  Output --  Net 240 ml   Filed Weights    Examination:  General exam: Overall comfortable, not in distress, morbidly obese HEENT: PERRL Respiratory system:  no wheezes or crackles  Cardiovascular system: S1 & S2 heard, RRR.  Gastrointestinal system: Abdomen is nondistended, soft and nontender. Central nervous system: Alert and oriented Extremities: No edema, no clubbing ,no cyanosis Skin: No rashes, no ulcers,no icterus     Data Reviewed: I have personally reviewed following labs and imaging studies  CBC: Recent Labs  Lab 06/23/22 1150 06/24/22 0532  WBC 11.6* 8.2  NEUTROABS 5.5  --   HGB 16.2* 14.5  HCT 47.6* 45.9  MCV 91.5 96.4  PLT 311 [id number]   Basic Metabolic Panel: Recent Labs  Lab 06/23/22 1150 06/24/22 0532  NA 139 137  K 4.0 3.6  CL 106 106  CO2 23 20*  GLUCOSE 96 91  BUN 8 7  CREATININE 0.76 0.74  CALCIUM 9.5 8.6*     No results found for this or any previous visit (from the  past 240 hour(s)).   Radiology Studies: ECHOCARDIOGRAM COMPLETE  Result Date: 06/24/2022    ECHOCARDIOGRAM REPORT   Patient Name:   Janice Wolf Date of Exam: 06/24/2022 Medical Rec #:  [id number]         Height:       67.0 in Accession #:    [id number]        Weight:       302.0 lb Date of Birth:  1980-12-09         BSA:          2.409 m Patient Age:    29 years          BP:           141/83 mmHg Patient Gender: F                 HR:           67 bpm. Exam Location:  Inpatient Procedure: 2D Echo, Cardiac Doppler and Color Doppler  Indications:    Pulmonary embolus  History:        Patient has no prior history of Echocardiogram examinations.  Sonographer:    Wenda Low Referring Phys: [id number] [facility] M PATEL  Sonographer Comments: Technically difficult study due to poor echo windows and patient is obese. IMPRESSIONS  1. Left ventricular ejection fraction, by estimation, is 60 to 65%. The left ventricle has normal function. The left ventricle has no regional wall motion abnormalities. Left ventricular diastolic parameters were normal.  2. Right ventricular systolic function is normal. The right ventricular size is normal.  3. The mitral valve is normal in structure. No evidence of mitral valve regurgitation. No evidence of mitral stenosis.  4. The aortic valve is tricuspid. Aortic valve regurgitation is not visualized. No aortic stenosis is present.  5. The inferior vena cava is normal in size with greater than 50% respiratory variability, suggesting right atrial pressure of 3 mmHg. FINDINGS  Left Ventricle: Left ventricular ejection fraction, by estimation, is 60 to 65%. The left ventricle has normal function. The left ventricle has no regional wall motion abnormalities. The left ventricular internal cavity size was normal in size. There is  no left ventricular hypertrophy. Left ventricular diastolic parameters were normal. Normal left ventricular filling pressure. Right Ventricle: The right ventricular size is normal. No increase in right ventricular wall thickness. Right ventricular systolic function is normal. Left Atrium: Left atrial size was normal in size. Right Atrium: Right atrial size was normal in size. Pericardium: There is no evidence of pericardial effusion. Mitral Valve: The mitral valve is normal in structure. No evidence of mitral valve regurgitation. No evidence of mitral valve stenosis. MV peak gradient, 3.3 mmHg. The mean mitral valve gradient is 1.0 mmHg. Tricuspid Valve: The tricuspid valve is normal in structure.  Tricuspid valve regurgitation is not demonstrated. No evidence of tricuspid stenosis. Aortic Valve: The aortic valve is tricuspid. Aortic valve regurgitation is not visualized. No aortic stenosis is present. Aortic valve mean gradient measures 2.0 mmHg. Aortic valve peak gradient measures 3.6 mmHg. Pulmonic Valve: The pulmonic valve was normal in structure. Pulmonic valve regurgitation is not visualized. No evidence of pulmonic stenosis. Aorta: The aortic root is normal in size and structure. Venous: The inferior vena cava is normal in size with greater than 50% respiratory variability, suggesting right atrial pressure of 3 mmHg. IAS/Shunts: No atrial level shunt detected by color flow Doppler.  LEFT VENTRICLE PLAX 2D LVIDd:  4.30 cm   Diastology LVIDs:         2.80 cm   LV e' medial:    7.40 cm/s LV PW:         1.30 cm   LV E/e' medial:  9.7 LV IVS:        1.50 cm   LV e' lateral:   11.60 cm/s LVOT diam:     2.30 cm   LV E/e' lateral: 6.2 LVOT Area:     4.15 cm  RIGHT VENTRICLE RV Basal diam:  3.70 cm RV Mid diam:    3.10 cm RV S prime:     10.90 cm/s TAPSE (M-mode): 2.6 cm LEFT ATRIUM             Index        RIGHT ATRIUM           Index LA diam:        3.30 cm 1.37 cm/m   RA Area:     18.30 cm LA Vol (A2C):   45.8 ml 19.01 ml/m  RA Volume:   46.30 ml  19.22 ml/m LA Vol (A4C):   35.5 ml 14.73 ml/m LA Biplane Vol: 44.1 ml 18.30 ml/m  AORTIC VALVE              PULMONIC VALVE AV Vmax:      94.50 cm/s  PV Vmax:       0.87 m/s AV Vmean:     65.900 cm/s PV Peak grad:  3.0 mmHg AV VTI:       0.216 m AV Peak Grad: 3.6 mmHg AV Mean Grad: 2.0 mmHg  AORTA Ao Root diam: 3.60 cm MITRAL VALVE MV Area (PHT): 4.60 cm    SHUNTS MV Peak grad:  3.3 mmHg    Systemic Diam: 2.30 cm MV Mean grad:  1.0 mmHg MV Vmax:       0.90 m/s MV Vmean:      43.3 cm/s MV Decel Time: 165 msec MV E velocity: 72.00 cm/s MV A velocity: 63.00 cm/s MV E/A ratio:  1.14 Skeet Latch MD Electronically signed by Skeet Latch MD Signature  Date/Time: 06/24/2022/11:58:07 AM    Final    CT ABDOMEN PELVIS W CONTRAST  Result Date: 06/23/2022 CLINICAL DATA:  Acute abdominal pain RIGHT flank pain ongoing since January. History of gastric bypass and dehydration. EXAM: CT ABDOMEN AND PELVIS WITH CONTRAST TECHNIQUE: Multidetector CT imaging of the abdomen and pelvis was performed using the standard protocol following bolus administration of intravenous contrast. RADIATION DOSE REDUCTION: This exam was performed according to the departmental dose-optimization program which includes automated exposure control, adjustment of the mA and/or kV according to patient size and/or use of iterative reconstruction technique. CONTRAST:  163m OMNIPAQUE IOHEXOL 350 MG/ML SOLN COMPARISON:  CT of the chest of June 23, 2022 FINDINGS: Lower chest: See dedicated chest CT for further detail regarding pulmonary emboli. Hepatobiliary: Severe hepatic steatosis with focal fat about the fissure for falciform ligament. Caudate hypertrophy and nodular contour with fissural widening. Post cholecystectomy. No signs of biliary duct dilation. No suspicious hepatic lesion. Portal vein is patent. Pancreas: Normal, without mass, inflammation or ductal dilatation. Spleen: Mild splenomegaly measuring nearly 13 cm greatest axial dimension. Adrenals/Urinary Tract: Adrenal glands are unremarkable. Symmetric renal enhancement. No sign of hydronephrosis. No suspicious renal lesion or perinephric stranding. Urinary bladder is grossly unremarkable. Urinary bladder with limited assessment due to under distension. Stomach/Bowel: Small hiatal hernia post gastric bypass procedure with Roux limb anterior  to the transverse colon. No stranding adjacent to the excluded stomach or gastric pouch. No signs of bowel obstruction. Normal appendix. Vascular/Lymphatic: Aorta with smooth contours. IVC with smooth contours. No aneurysmal dilation of the abdominal aorta. There is no gastrohepatic or hepatoduodenal  ligament lymphadenopathy. No retroperitoneal or mesenteric lymphadenopathy. No pelvic sidewall lymphadenopathy. Reproductive: IUD in-situ. Otherwise unremarkable appearance of reproductive structures. Other: No ascites. Area of stranding in the omentum deep to the umbilicus 4.3 x 1.6 cm suggested on previous imaging perhaps slightly more conspicuous though contrast is utilized on today's study and was not utilized on the prior exam. Musculoskeletal: No acute bone finding. No destructive bone process. Spinal degenerative changes. IMPRESSION: 1. Area of stranding in the omentum deep to the umbilicus likely a small area of omental infarct, benign side present previously but perhaps slightly more conspicuous. No fluid in the area or other concerning findings. 2. Hepatomegaly with mild splenomegaly and severe hepatic steatosis with fissural widening and caudate hypertrophy should be correlated with any clinical or laboratory evidence of liver disease. Hepatic morphology suggests the possibility of early cirrhosis. 3. Post gastric bypass. 4. Pulmonary embolism better seen on dedicated CT angiography of the chest. Previously communicated to emergency department staff as outlined in the CT angiogram report. Electronically Signed   By: Zetta Bills M.D.   On: 06/23/2022 14:22   CT Angio Chest PE W and/or Wo Contrast  Result Date: 06/23/2022 CLINICAL DATA:  Elevated D-dimer in a.m. patient with RIGHT flank pain since January. EXAM: CT ANGIOGRAPHY CHEST WITH CONTRAST TECHNIQUE: Multidetector CT imaging of the chest was performed using the standard protocol during bolus administration of intravenous contrast. Multiplanar CT image reconstructions and MIPs were obtained to evaluate the vascular anatomy. RADIATION DOSE REDUCTION: This exam was performed according to the departmental dose-optimization program which includes automated exposure control, adjustment of the mA and/or kV according to patient size and/or use of  iterative reconstruction technique. CONTRAST:  137m OMNIPAQUE IOHEXOL 350 MG/ML SOLN COMPARISON:  Only radiographs are available for comparison. FINDINGS: Cardiovascular: Normal caliber of the thoracic aorta. No acute aortic process to the extent evaluated on this study which is performed for evaluation of the pulmonary arterial system. Heart size is mildly enlarged. No pericardial effusion or sign of pericardial nodularity. Central pulmonary artery is opacified at 240 Hounsfield units. Segmental level emboli just beyond lobar level in the RIGHT lower lobe (image 58/4) also with segmental level and subsegmental emboli elsewhere in the RIGHT lower lobe. No central or lobar embolism. RV to LV ratio less than 1.0. Mediastinum/Nodes: No thoracic inlet, axillary, mediastinal or hilar adenopathy. Esophagus grossly normal. Lungs/Pleura: No dense consolidative changes. No sign of pleural effusion on the LEFT. Small pleural effusion on the RIGHT. Airways are patent. Upper Abdomen: See dedicated imaging of the abdomen and pelvis for further details regarding abdomen pelvis findings. There are changes of a Paddock steatosis with fissural widening and caudate hypertrophy with nodular hepatic contour. Post cholecystectomy. Splenomegaly. Musculoskeletal: No chest wall abnormality. No acute bone finding. No destructive bone process. Review of the MIP images confirms the above findings. IMPRESSION: 1. Central segmental RIGHT lower lobe emboli to medial and posterior segments just beyond the lobar level. 2. Straightening of the interventricular septum. Correlate with any signs of RIGHT heart strain by echocardiography as warranted. RV to LV ratio is less than 1.0. 3. Small pleural effusion on the RIGHT. 4. See dedicated imaging of the abdomen and pelvis for further details regarding abdomen pelvis findings. 5. Hepatic  steatosis and cirrhotic hepatic morphology with splenomegaly in this patient post gastric bypass. See dedicated  abdominal imaging for further detail. Critical Value/emergent results were called by telephone at the time of interpretation on 06/23/2022 at 2:13 pm to provider [facility] , who verbally acknowledged these results. Electronically Signed   By: Zetta Bills M.D.   On: 06/23/2022 14:13    Scheduled Meds:  gabapentin  300 mg Oral BID   levothyroxine  100 mcg Oral Q0600   modafinil  100 mg Oral Daily   multivitamin with minerals  1 tablet Oral Daily   pantoprazole  40 mg Oral Daily   tiZANidine  2 mg Oral BID   Continuous Infusions:  heparin 1,600 Units/hr (06/24/22 0349)     LOS: 0 days   Shelly Coss, MD [facility] P2/21/2024, 12:52 PM

## 2022-06-24 NOTE — Progress Notes (Signed)
BLE venous duplex has been completed.   Results can be found under chart review under CV PROC. 06/24/2022 3:25 PM Errin Whitelaw RVT, RDMS

## 2022-06-24 NOTE — Progress Notes (Signed)
ANTICOAGULATION CONSULT NOTE - follow up  Pharmacy Consult for IV heparin Indication: pulmonary embolus  Allergies  Allergen Reactions   Aspirin Nausea And Vomiting   Latex Other (See Comments)    SKIN IRRITATION   Other Rash    PERFUMES    Patient Measurements:   Heparin Dosing Weight: 95 kg (using recent weight 302 lb 05/26/22)   Vital Signs: Temp: 97.8 F (36.6 C) (02/21 0415) Temp Source: Oral (02/21 0415) BP: 141/83 (02/21 0415) Pulse Rate: 59 (02/21 0415)  Labs: Recent Labs    06/23/22 1150 06/23/22 2114 06/24/22 0532  HGB 16.2*  --  14.5  HCT 47.6*  --  45.9  PLT 311  --  236  HEPARINUNFRC  --  0.62 0.39  CREATININE 0.76  --  0.74     CrCl cannot be calculated (Unknown ideal weight.).   Medical History: Past Medical History:  Diagnosis Date   Abnormal thyroid blood test    no treatment yet, per pt.   Anxiety    Arthritis    Back pain    Complication of anesthesia    was hard to wake up after T & A 12/2015; had to stay overnight   Dental crowns present    Depression    Dizziness    Fatty liver    Gallbladder problem    GERD (gastroesophageal reflux disease)    Hypothyroidism    IBS (irritable bowel syndrome)    Migraines    migraines   Neuromuscular disorder ([place])    nerve damage in foot   Painful orthopaedic hardware ([place]) 09/2016   left foot   Pneumonia    PONV (postoperative nausea and vomiting)    Sleep apnea    uses cpap   Stuffy nose 09/03/2016   Vitamin D deficiency     Assessment: 49 YOF with PE. No oral anticoagulant PTA. Hgb 16.2, PLT 311. Pharmacy consulted to dose IV heparin.   Today, 06/24/22 HL is 0.39, therapeutic on heparin 1600 units/hr  Hgb 14.3, plt 236 SCr 0.74 mg/dl No line or bleeding issues noted   Goal of Therapy:  Heparin level 0.3-0.7 units/ml Monitor platelets by anticoagulation protocol: Yes   Plan:  Continue heparin drip at 1600 units/hr Confirmatory heparin with am labs Daily heparin level and  CBC while on heparin Monitor s/sx bleeding   Royetta Asal, PharmD, BCPS 06/24/2022 8:13 AM

## 2022-06-24 NOTE — TOC Initial Note (Signed)
Transition of Care [facility]) - Initial/Assessment Note    Patient Details  Name: Janice Wolf MRN: [id number] Date of Birth: 1980-08-28  [facility]) CM/SW Contact:    Ninfa Meeker, RN Phone Number: 06/24/2022, 10:33 AM  Clinical Narrative:                 Transition of care Screening Note:  Transition of Care [facility]) Department has reviewed patient and no TOC needs have been identified at this time. We will continue to monitor patient advancement through Interdisciplinary progressions and if new patient needs arise, please place a consult.       Patient Goals and CMS Choice            Expected Discharge Plan and Services                                              Prior Living Arrangements/Services                       Activities of Daily Living Home Assistive Devices/Equipment: None ADL Screening (condition at time of admission) Patient's cognitive ability adequate to safely complete daily activities?: Yes Is the patient deaf or have difficulty hearing?: No Does the patient have difficulty seeing, even when wearing glasses/contacts?: No Does the patient have difficulty concentrating, remembering, or making decisions?: No Patient able to express need for assistance with ADLs?: No Does the patient have difficulty dressing or bathing?: No Independently performs ADLs?: Yes (appropriate for developmental age) Does the patient have difficulty walking or climbing stairs?: No Weakness of Legs: None Weakness of Arms/Hands: None  Permission Sought/Granted                  Emotional Assessment              Admission diagnosis:  Pulmonary embolism ([place]) [I26.99] Right flank pain [R10.9] Single subsegmental pulmonary embolism without acute cor pulmonale (HCC) [I26.93] Patient Active Problem List   Diagnosis Date Noted   Pulmonary embolism ([place]) 06/23/2022   Hypothyroidism 06/23/2022   GERD without esophagitis 06/23/2022    Peripheral neuropathy 06/23/2022   Dehydration 06/12/2022   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   Lisfranc dislocation, left, subsequent encounter 02/20/2016   PCP:  Jonathon Jordan, MD Pharmacy:   [facility] [id number] - [address] [place] [address] Phone: [phone] Fax: [phone]     Social Determinants of Health (SDOH) Social History: SDOH Screenings   Food Insecurity: No Food Insecurity (06/24/2022)  Housing: Low Risk  (06/24/2022)  Transportation Needs: No Transportation Needs (06/24/2022)  Utilities: Not At Risk (06/24/2022)  Depression (PHQ2-9): Low Risk  (01/31/2022)  Tobacco Use: Low Risk  (06/23/2022)   SDOH Interventions:     Readmission Risk Interventions     No data to display

## 2022-06-24 NOTE — Progress Notes (Signed)
*  PRELIMINARY RESULTS* Echocardiogram 2D Echocardiogram has been performed.  Janice Wolf 06/24/2022, 10:33 AM

## 2022-06-24 NOTE — Progress Notes (Signed)
ANTICOAGULATION CONSULT NOTE - follow up  Pharmacy Consult for IV heparin >> apixaban Indication: pulmonary embolus  Allergies  Allergen Reactions   Aspirin Nausea And Vomiting   Latex Other (See Comments)    SKIN IRRITATION   Other Rash    PERFUMES    Patient Measurements:   Heparin Dosing Weight: 95 kg (using recent weight 302 lb 05/26/22)   Vital Signs: Temp: 98.4 F (36.9 C) (02/21 1345) Temp Source: Oral (02/21 1345) BP: 122/79 (02/21 1345) Pulse Rate: 61 (02/21 1345)  Labs: Recent Labs    06/23/22 1150 06/23/22 2114 06/24/22 0532  HGB 16.2*  --  14.5  HCT 47.6*  --  45.9  PLT 311  --  236  HEPARINUNFRC  --  0.62 0.39  CREATININE 0.76  --  0.74     CrCl cannot be calculated (Unknown ideal weight.).   Medical History: Past Medical History:  Diagnosis Date   Abnormal thyroid blood test    no treatment yet, per pt.   Anxiety    Arthritis    Back pain    Complication of anesthesia    was hard to wake up after T & A 12/2015; had to stay overnight   Dental crowns present    Depression    Dizziness    Fatty liver    Gallbladder problem    GERD (gastroesophageal reflux disease)    Hypothyroidism    IBS (irritable bowel syndrome)    Migraines    migraines   Neuromuscular disorder ([place])    nerve damage in foot   Painful orthopaedic hardware ([place]) 09/2016   left foot   Pneumonia    PONV (postoperative nausea and vomiting)    Sleep apnea    uses cpap   Stuffy nose 09/03/2016   Vitamin D deficiency     Assessment: 69 YOF with PE. No oral anticoagulant PTA. Hgb 16.2, PLT 311. Pharmacy consulted to dose IV heparin.   Today, 06/24/22 Hgb 14.3, plt 236 SCr 0.74 mg/dl No line or bleeding issues noted   Goal of Therapy:  Heparin level 0.3-0.7 units/ml Monitor platelets by anticoagulation protocol: Yes   Plan:  Stop heparin drip and start apixaban 10 mg BID x 7 day sthen apixaban 5 mg PO BID  Monitor SCr and  CBC   Monitor s/sx  bleeding   Royetta Asal, PharmD, BCPS 06/24/2022 2:00 PM

## 2022-06-25 ENCOUNTER — Other Ambulatory Visit (HOSPITAL_COMMUNITY): Payer: Self-pay

## 2022-06-25 ENCOUNTER — Observation Stay (HOSPITAL_COMMUNITY): Payer: 59

## 2022-06-25 DIAGNOSIS — I2693 Single subsegmental pulmonary embolism without acute cor pulmonale: Secondary | ICD-10-CM | POA: Diagnosis not present

## 2022-06-25 DIAGNOSIS — I2694 Multiple subsegmental pulmonary emboli without acute cor pulmonale: Secondary | ICD-10-CM | POA: Diagnosis not present

## 2022-06-25 LAB — HEPARIN LEVEL (UNFRACTIONATED): Heparin Unfractionated: >1.1 IU/mL — ABNORMAL HIGH (ref 0.30–0.70)

## 2022-06-25 MED ORDER — APIXABAN 5 MG PO TABS
10.0000 mg | ORAL_TABLET | Freq: Two times a day (BID) | ORAL | 0 refills | Status: AC
  Filled 2022-06-25: qty 22, 6d supply, fill #0

## 2022-06-25 MED ORDER — BUTALBITAL-APAP-CAFFEINE 50-325-40 MG PO TABS
1.0000 | ORAL_TABLET | Freq: Four times a day (QID) | ORAL | 0 refills | Status: AC | PRN
  Filled 2022-06-25: qty 10, 3d supply, fill #0

## 2022-06-25 MED ORDER — APIXABAN 5 MG PO TABS
5.0000 mg | ORAL_TABLET | Freq: Two times a day (BID) | ORAL | 1 refills | Status: AC
  Filled 2022-06-25: qty 60, 30d supply, fill #0
  Filled 2022-08-03: qty 60, 30d supply, fill #1

## 2022-06-25 MED ORDER — OXYCODONE HCL 5 MG PO TABS
5.0000 mg | ORAL_TABLET | Freq: Four times a day (QID) | ORAL | 0 refills | Status: DC | PRN
  Filled 2022-06-25: qty 15, 4d supply, fill #0

## 2022-06-25 MED ORDER — CYCLOBENZAPRINE HCL 7.5 MG PO TABS
7.5000 mg | ORAL_TABLET | Freq: Three times a day (TID) | ORAL | 0 refills | Status: DC | PRN
  Filled 2022-06-25: qty 15, 5d supply, fill #0

## 2022-06-25 MED ORDER — LIDOCAINE 4 % EX PTCH
1.0000 | MEDICATED_PATCH | CUTANEOUS | 0 refills | Status: DC
  Filled 2022-06-25: qty 10, 10d supply, fill #0

## 2022-06-25 MED ORDER — BUTALBITAL-APAP-CAFFEINE 50-325-40 MG PO TABS
1.0000 | ORAL_TABLET | Freq: Four times a day (QID) | ORAL | Status: DC | PRN
  Administered 2022-06-25: 1 via ORAL
  Filled 2022-06-25: qty 1

## 2022-06-25 MED ORDER — TRAMADOL HCL 50 MG PO TABS: 50.0000 mg | ORAL_TABLET | ORAL | Status: DC | PRN

## 2022-06-25 NOTE — Progress Notes (Signed)
PT Cancellation Note  Patient Details Name: Janice Wolf MRN: [id number] DOB: 10/08/1980   Cancelled Treatment:    Reason Eval/Treat Not Completed: PT screened, no needs identified, will sign off. Pt reports she has been ambulating around the room, no issues. Pt politely declines PT eval.     Tori Zoriana Oats PT, DPT 06/25/22, 1:35 PM

## 2022-06-25 NOTE — TOC Progression Note (Signed)
Transition of Care [facility]) - Progression Note    Patient Details  Name: Janice Wolf MRN: [id number] Date of Birth: 1980-05-29  Transition of Care [facility] CM/SW [place], LCSW Phone Number: 06/25/2022, 8:49 AM  Clinical Narrative:    TOC consulted for "Eliquis coupons." TOC has reached out to pharmacy who provides coupons and pt education for eliquis. Pharmacy confirmed they will provide eliquis coupon to patient.         Expected Discharge Plan and Services                                               Social Determinants of Health (SDOH) Interventions SDOH Screenings   Food Insecurity: No Food Insecurity (06/24/2022)  Housing: Low Risk  (06/24/2022)  Transportation Needs: No Transportation Needs (06/24/2022)  Utilities: Not At Risk (06/24/2022)  Depression (PHQ2-9): Low Risk  (01/31/2022)  Tobacco Use: Low Risk  (06/23/2022)    Readmission Risk Interventions     No data to display

## 2022-06-25 NOTE — Discharge Summary (Signed)
Physician Discharge Summary  CHARQUITA Wolf [id number] DOB: 06-13-1980 DOA: 06/23/2022  PCP: Jonathon Jordan, MD  Admit date: 06/23/2022 Discharge date: 06/25/2022  Admitted From: Home Disposition:  Home  Discharge Condition:Stable CODE STATUS:FULL Diet recommendation:  Regular  Brief/Interim Summary: Patient is a 42 year old female with history of GERD, depression, osteoarthritis, anxiety, hypothyroidism, IBS, migraine, fatty liver who presented with acute onset of right upper back  pain mainly associated  with deep breathing.  Patient had a gastric bypass surgery in November with loss of 60 pounds so far.  On presentation , her vital signs were stable.  Lab work showed mild leukocytosis.  Abdominal/pelvic CT showed area for stranding in the omentum likely representing area of omental infarct, hepatomegaly with mild splenomegaly and segmental right lower lobe emboli to the medial and posterior segments.  CT angiogram showed central segmental RIGHT lower lobe emboli to medial and posterior segments just beyond the lobar level.Patient was started on heparin drip.  She remained hemodynamically stable.  Transitioned to Eliquis.  Medically stable for discharge.  Following problems were addressed during the hospitalization:  Acute PE: Presented with right upper back pain, shortness of breath.  CT finding as above.  Started on heparin drip.  2D echo showed EF of 60 to 65%, no wall motion, normal left ventricular diastolic parameters, normal right ventricular systolic function and size. Changed IV heparin to Eliquis.  Most likely Eliquis to be continued for 3 to 6 months.  She needs to follow-up with her PCP   Persistent right lower back pain: Complains of pain on the back, on the right lower rib side.  There was some tenderness also.  Chest x-ray 2 views showed infiltrate on the periphery of the right base of the lung likely representing pulmonary infarct secondary to PE.  Continue symptomatic  management with pain management , muscle relaxants,lidocaine patch  Hypothyroidism: Continue Synthyroid   History of GERD with esophagitis: On PPI   History of peripheral neuropathy: On Neurontin   Discharge Diagnoses:  Principal Problem:   Pulmonary embolism ([place]) Active Problems:   Hypothyroidism   GERD without esophagitis   Peripheral neuropathy    Discharge Instructions  Discharge Instructions     Diet general   Complete by: As directed    Discharge instructions   Complete by: As directed    1)Please take prescribed medications as instructed 2)Follow up with your PCP in a week   Increase activity slowly   Complete by: As directed       Allergies as of 06/25/2022       Reactions   Aspirin Nausea And Vomiting   Latex Other (See Comments)   SKIN IRRITATION   Other Rash   PERFUMES        Medication List     STOP taking these medications    cephALEXin 500 MG capsule Commonly known as: KEFLEX   methocarbamol 500 MG tablet Commonly known as: ROBAXIN   methylPREDNISolone 4 MG Tbpk tablet Commonly known as: MEDROL DOSEPAK   oxyCODONE-acetaminophen 5-325 MG tablet Commonly known as: PERCOCET/ROXICET   tiZANidine 2 MG tablet Commonly known as: ZANAFLEX       TAKE these medications    apixaban 5 MG Tabs tablet Commonly known as: ELIQUIS Take 2 tablets (10 mg total) by mouth 2 (two) times daily for 6 days.   apixaban 5 MG Tabs tablet Commonly known as: ELIQUIS Take 1 tablet (5 mg total) by mouth 2 (two) times daily. Start taking on: July 01, 2022  Armodafinil 50 MG tablet Take 50 mg by mouth daily.   butalbital-acetaminophen-caffeine 50-325-40 MG tablet Commonly known as: FIORICET Take 1 tablet by mouth every 6 (six) hours as needed for headache.   CALCIUM PO Take 3 tablets by mouth daily.   clindamycin 1 % lotion Commonly known as: CLEOCIN T Apply 1 Application topically daily as needed (bumps in vaginal area).   cyclobenzaprine  7.5 MG tablet Commonly known as: FEXMID Take 1 tablet (7.5 mg total) by mouth 3 (three) times daily as needed for muscle spasms (right flank pain).   esomeprazole 20 MG capsule Commonly known as: NEXIUM Take 20 mg by mouth daily.   fluticasone 50 MCG/ACT nasal spray Commonly known as: FLONASE Place 1 spray into both nostrils daily as needed for allergies.   gabapentin 300 MG capsule Commonly known as: NEURONTIN Take 300 mg by mouth 2 (two) times daily.   levonorgestrel 20 MCG/24HR IUD Commonly known as: MIRENA 1 each by Intrauterine route once.   levothyroxine 100 MCG tablet Commonly known as: SYNTHROID Take 100 mcg by mouth daily before breakfast.   lidocaine 5 % ointment Commonly known as: XYLOCAINE 1 Application daily as needed (Foot pain).   multivitamin tablet Take 2 tablets by mouth daily.   nystatin cream Commonly known as: MYCOSTATIN Apply 1 Application topically daily as needed (fever blisters).   ondansetron 4 MG disintegrating tablet Commonly known as: ZOFRAN-ODT Take 1 tablet (4 mg total) by mouth every 6 (six) hours as needed for nausea or vomiting.   oxyCODONE 5 MG immediate release tablet Commonly known as: Oxy IR/ROXICODONE Take 1 tablet (5 mg total) by mouth every 6 (six) hours as needed for severe pain.        Allergies  Allergen Reactions   Aspirin Nausea And Vomiting   Latex Other (See Comments)    SKIN IRRITATION   Other Rash    PERFUMES    Consultations: None   Procedures/Studies: DG Chest 2 View  Result Date: 06/25/2022 CLINICAL DATA:  Chest pain.  P EXAM: CHEST - 2 VIEW COMPARISON:  01/13/2022, 06/23/2022 FINDINGS: The heart size and mediastinal contours are within normal limits. New streaky opacity at the periphery of the right lung base. Left lung is clear. No pleural effusion or pneumothorax. The visualized skeletal structures are unremarkable. IMPRESSION: New streaky opacity at the periphery of the right lung base, which may  reflect atelectasis, infiltrate, versus pulmonary infarction given recent PE. Electronically Signed   By: Davina Poke D.O.   On: 06/25/2022 12:38   VAS [place] LOWER EXTREMITY VENOUS (DVT)  Result Date: 06/24/2022  Lower Venous DVT Study Patient Name:  Janice Wolf  Date of Exam:   06/24/2022 Medical Rec #: [id number]          Accession #:    [id number] Date of Birth: Sep 07, 1980          Patient Gender: F Patient Age:   29 years Exam Location:  [facility] Procedure:      VAS [place] LOWER EXTREMITY VENOUS (DVT) Referring Phys: PRANAV PATEL --------------------------------------------------------------------------------  Indications: Pulmonary embolism.  Comparison Study: No previous exams Performing Technologist: Jody Hill RVT, RDMS  Examination Guidelines: A complete evaluation includes B-mode imaging, spectral Doppler, color Doppler, and power Doppler as needed of all accessible portions of each vessel. Bilateral testing is considered an integral part of a complete examination. Limited examinations for reoccurring indications may be performed as noted. The reflux portion of the exam is performed with the patient  in reverse Trendelenburg.  +---------+---------------+---------+-----------+----------+--------------+ RIGHT    CompressibilityPhasicitySpontaneityPropertiesThrombus Aging +---------+---------------+---------+-----------+----------+--------------+ CFV      Full           Yes      Yes                                 +---------+---------------+---------+-----------+----------+--------------+ SFJ      Full                                                        +---------+---------------+---------+-----------+----------+--------------+ FV Prox  Full           Yes      Yes                                 +---------+---------------+---------+-----------+----------+--------------+ FV Mid   Full           Yes      Yes                                  +---------+---------------+---------+-----------+----------+--------------+ FV DistalFull           Yes      Yes                                 +---------+---------------+---------+-----------+----------+--------------+ PFV      Full                                                        +---------+---------------+---------+-----------+----------+--------------+ POP      Full           Yes      Yes                                 +---------+---------------+---------+-----------+----------+--------------+ PTV      Full                                                        +---------+---------------+---------+-----------+----------+--------------+ PERO     Full                                                        +---------+---------------+---------+-----------+----------+--------------+   +---------+---------------+---------+-----------+----------+--------------+ LEFT     CompressibilityPhasicitySpontaneityPropertiesThrombus Aging +---------+---------------+---------+-----------+----------+--------------+ CFV      Full           Yes      Yes                                 +---------+---------------+---------+-----------+----------+--------------+  SFJ      Full                                                        +---------+---------------+---------+-----------+----------+--------------+ FV Prox  Full           Yes                                          +---------+---------------+---------+-----------+----------+--------------+ FV Mid   Full           Yes                                          +---------+---------------+---------+-----------+----------+--------------+ FV DistalFull           Yes      Yes                                 +---------+---------------+---------+-----------+----------+--------------+ PFV      Full                                                         +---------+---------------+---------+-----------+----------+--------------+ POP      Full           Yes      Yes                                 +---------+---------------+---------+-----------+----------+--------------+ PTV      Full                                                        +---------+---------------+---------+-----------+----------+--------------+ PERO     Full                                                        +---------+---------------+---------+-----------+----------+--------------+     Summary: BILATERAL: - No evidence of deep vein thrombosis seen in the lower extremities, bilaterally. -No evidence of popliteal cyst, bilaterally.   *See table(s) above for measurements and observations. Electronically signed by Jamelle Haring on 06/24/2022 at 10:05:46 PM.    Final    ECHOCARDIOGRAM COMPLETE  Result Date: 06/24/2022    ECHOCARDIOGRAM REPORT   Patient Name:   MILENKA SUNADA Date of Exam: 06/24/2022 Medical Rec #:  [id number]         Height:       67.0 in Accession #:    [id number]        Weight:       302.0 lb Date of Birth:  Feb 16, 1981  BSA:          2.409 m Patient Age:    41 years          BP:           141/83 mmHg Patient Gender: F                 HR:           67 bpm. Exam Location:  Inpatient Procedure: 2D Echo, Cardiac Doppler and Color Doppler Indications:    Pulmonary embolus  History:        Patient has no prior history of Echocardiogram examinations.  Sonographer:    Wenda Low Referring Phys: [id number] [facility] M PATEL  Sonographer Comments: Technically difficult study due to poor echo windows and patient is obese. IMPRESSIONS  1. Left ventricular ejection fraction, by estimation, is 60 to 65%. The left ventricle has normal function. The left ventricle has no regional wall motion abnormalities. Left ventricular diastolic parameters were normal.  2. Right ventricular systolic function is normal. The right ventricular size is normal.  3. The mitral  valve is normal in structure. No evidence of mitral valve regurgitation. No evidence of mitral stenosis.  4. The aortic valve is tricuspid. Aortic valve regurgitation is not visualized. No aortic stenosis is present.  5. The inferior vena cava is normal in size with greater than 50% respiratory variability, suggesting right atrial pressure of 3 mmHg. FINDINGS  Left Ventricle: Left ventricular ejection fraction, by estimation, is 60 to 65%. The left ventricle has normal function. The left ventricle has no regional wall motion abnormalities. The left ventricular internal cavity size was normal in size. There is  no left ventricular hypertrophy. Left ventricular diastolic parameters were normal. Normal left ventricular filling pressure. Right Ventricle: The right ventricular size is normal. No increase in right ventricular wall thickness. Right ventricular systolic function is normal. Left Atrium: Left atrial size was normal in size. Right Atrium: Right atrial size was normal in size. Pericardium: There is no evidence of pericardial effusion. Mitral Valve: The mitral valve is normal in structure. No evidence of mitral valve regurgitation. No evidence of mitral valve stenosis. MV peak gradient, 3.3 mmHg. The mean mitral valve gradient is 1.0 mmHg. Tricuspid Valve: The tricuspid valve is normal in structure. Tricuspid valve regurgitation is not demonstrated. No evidence of tricuspid stenosis. Aortic Valve: The aortic valve is tricuspid. Aortic valve regurgitation is not visualized. No aortic stenosis is present. Aortic valve mean gradient measures 2.0 mmHg. Aortic valve peak gradient measures 3.6 mmHg. Pulmonic Valve: The pulmonic valve was normal in structure. Pulmonic valve regurgitation is not visualized. No evidence of pulmonic stenosis. Aorta: The aortic root is normal in size and structure. Venous: The inferior vena cava is normal in size with greater than 50% respiratory variability, suggesting right atrial  pressure of 3 mmHg. IAS/Shunts: No atrial level shunt detected by color flow Doppler.  LEFT VENTRICLE PLAX 2D LVIDd:         4.30 cm   Diastology LVIDs:         2.80 cm   LV e' medial:    7.40 cm/s LV PW:         1.30 cm   LV E/e' medial:  9.7 LV IVS:        1.50 cm   LV e' lateral:   11.60 cm/s LVOT diam:     2.30 cm   LV E/e' lateral: 6.2 LVOT Area:     4.15 cm  RIGHT VENTRICLE RV Basal diam:  3.70 cm RV Mid diam:    3.10 cm RV S prime:     10.90 cm/s TAPSE (M-mode): 2.6 cm LEFT ATRIUM             Index        RIGHT ATRIUM           Index LA diam:        3.30 cm 1.37 cm/m   RA Area:     18.30 cm LA Vol (A2C):   45.8 ml 19.01 ml/m  RA Volume:   46.30 ml  19.22 ml/m LA Vol (A4C):   35.5 ml 14.73 ml/m LA Biplane Vol: 44.1 ml 18.30 ml/m  AORTIC VALVE              PULMONIC VALVE AV Vmax:      94.50 cm/s  PV Vmax:       0.87 m/s AV Vmean:     65.900 cm/s PV Peak grad:  3.0 mmHg AV VTI:       0.216 m AV Peak Grad: 3.6 mmHg AV Mean Grad: 2.0 mmHg  AORTA Ao Root diam: 3.60 cm MITRAL VALVE MV Area (PHT): 4.60 cm    SHUNTS MV Peak grad:  3.3 mmHg    Systemic Diam: 2.30 cm MV Mean grad:  1.0 mmHg MV Vmax:       0.90 m/s MV Vmean:      43.3 cm/s MV Decel Time: 165 msec MV E velocity: 72.00 cm/s MV A velocity: 63.00 cm/s MV E/A ratio:  1.14 Skeet Latch MD Electronically signed by Skeet Latch MD Signature Date/Time: 06/24/2022/11:58:07 AM    Final    CT ABDOMEN PELVIS W CONTRAST  Result Date: 06/23/2022 CLINICAL DATA:  Acute abdominal pain RIGHT flank pain ongoing since January. History of gastric bypass and dehydration. EXAM: CT ABDOMEN AND PELVIS WITH CONTRAST TECHNIQUE: Multidetector CT imaging of the abdomen and pelvis was performed using the standard protocol following bolus administration of intravenous contrast. RADIATION DOSE REDUCTION: This exam was performed according to the departmental dose-optimization program which includes automated exposure control, adjustment of the mA and/or kV according to  patient size and/or use of iterative reconstruction technique. CONTRAST:  181m OMNIPAQUE IOHEXOL 350 MG/ML SOLN COMPARISON:  CT of the chest of June 23, 2022 FINDINGS: Lower chest: See dedicated chest CT for further detail regarding pulmonary emboli. Hepatobiliary: Severe hepatic steatosis with focal fat about the fissure for falciform ligament. Caudate hypertrophy and nodular contour with fissural widening. Post cholecystectomy. No signs of biliary duct dilation. No suspicious hepatic lesion. Portal vein is patent. Pancreas: Normal, without mass, inflammation or ductal dilatation. Spleen: Mild splenomegaly measuring nearly 13 cm greatest axial dimension. Adrenals/Urinary Tract: Adrenal glands are unremarkable. Symmetric renal enhancement. No sign of hydronephrosis. No suspicious renal lesion or perinephric stranding. Urinary bladder is grossly unremarkable. Urinary bladder with limited assessment due to under distension. Stomach/Bowel: Small hiatal hernia post gastric bypass procedure with Roux limb anterior to the transverse colon. No stranding adjacent to the excluded stomach or gastric pouch. No signs of bowel obstruction. Normal appendix. Vascular/Lymphatic: Aorta with smooth contours. IVC with smooth contours. No aneurysmal dilation of the abdominal aorta. There is no gastrohepatic or hepatoduodenal ligament lymphadenopathy. No retroperitoneal or mesenteric lymphadenopathy. No pelvic sidewall lymphadenopathy. Reproductive: IUD in-situ. Otherwise unremarkable appearance of reproductive structures. Other: No ascites. Area of stranding in the omentum deep to the umbilicus 4.3 x 1.6 cm suggested on previous imaging perhaps slightly more conspicuous  though contrast is utilized on today's study and was not utilized on the prior exam. Musculoskeletal: No acute bone finding. No destructive bone process. Spinal degenerative changes. IMPRESSION: 1. Area of stranding in the omentum deep to the umbilicus likely a  small area of omental infarct, benign side present previously but perhaps slightly more conspicuous. No fluid in the area or other concerning findings. 2. Hepatomegaly with mild splenomegaly and severe hepatic steatosis with fissural widening and caudate hypertrophy should be correlated with any clinical or laboratory evidence of liver disease. Hepatic morphology suggests the possibility of early cirrhosis. 3. Post gastric bypass. 4. Pulmonary embolism better seen on dedicated CT angiography of the chest. Previously communicated to emergency department staff as outlined in the CT angiogram report. Electronically Signed   By: Zetta Bills M.D.   On: 06/23/2022 14:22   CT Angio Chest PE W and/or Wo Contrast  Result Date: 06/23/2022 CLINICAL DATA:  Elevated D-dimer in a.m. patient with RIGHT flank pain since January. EXAM: CT ANGIOGRAPHY CHEST WITH CONTRAST TECHNIQUE: Multidetector CT imaging of the chest was performed using the standard protocol during bolus administration of intravenous contrast. Multiplanar CT image reconstructions and MIPs were obtained to evaluate the vascular anatomy. RADIATION DOSE REDUCTION: This exam was performed according to the departmental dose-optimization program which includes automated exposure control, adjustment of the mA and/or kV according to patient size and/or use of iterative reconstruction technique. CONTRAST:  167m OMNIPAQUE IOHEXOL 350 MG/ML SOLN COMPARISON:  Only radiographs are available for comparison. FINDINGS: Cardiovascular: Normal caliber of the thoracic aorta. No acute aortic process to the extent evaluated on this study which is performed for evaluation of the pulmonary arterial system. Heart size is mildly enlarged. No pericardial effusion or sign of pericardial nodularity. Central pulmonary artery is opacified at 240 Hounsfield units. Segmental level emboli just beyond lobar level in the RIGHT lower lobe (image 58/4) also with segmental level and  subsegmental emboli elsewhere in the RIGHT lower lobe. No central or lobar embolism. RV to LV ratio less than 1.0. Mediastinum/Nodes: No thoracic inlet, axillary, mediastinal or hilar adenopathy. Esophagus grossly normal. Lungs/Pleura: No dense consolidative changes. No sign of pleural effusion on the LEFT. Small pleural effusion on the RIGHT. Airways are patent. Upper Abdomen: See dedicated imaging of the abdomen and pelvis for further details regarding abdomen pelvis findings. There are changes of a [place]ock steatosis with fissural widening and caudate hypertrophy with nodular hepatic contour. Post cholecystectomy. Splenomegaly. Musculoskeletal: No chest wall abnormality. No acute bone finding. No destructive bone process. Review of the MIP images confirms the above findings. IMPRESSION: 1. Central segmental RIGHT lower lobe emboli to medial and posterior segments just beyond the lobar level. 2. Straightening of the interventricular septum. Correlate with any signs of RIGHT heart strain by echocardiography as warranted. RV to LV ratio is less than 1.0. 3. Small pleural effusion on the RIGHT. 4. See dedicated imaging of the abdomen and pelvis for further details regarding abdomen pelvis findings. 5. Hepatic steatosis and cirrhotic hepatic morphology with splenomegaly in this patient post gastric bypass. See dedicated abdominal imaging for further detail. Critical Value/emergent results were called by telephone at the time of interpretation on 06/23/2022 at 2:13 pm to [facility], who verbally acknowledged these results. Electronically Signed   By: GZetta BillsM.D.   On: 06/23/2022 14:13      Subjective: Patient seen and examined at bedside today.  Hemodynamically stable.  On room air.  Denies any shortness of breath or  cough.  She still has some discomfort on the right upper back near the area of right lower ribs.  Discharge Exam: Vitals:   06/24/22 1949 06/25/22 0443  BP: (!) 95/58 105/68   Pulse: 64 (!) 56  Resp: 20 16  Temp: 97.8 F (36.6 C) (!) 97.3 F (36.3 C)  SpO2: 96% 99%   Vitals:   06/24/22 0804 06/24/22 1345 06/24/22 1949 06/25/22 0443  BP: 128/80 122/79 (!) 95/58 105/68  Pulse: (!) 55 61 64 (!) 56  Resp: 16 18 20 16  $ Temp: 97.9 F (36.6 C) 98.4 F (36.9 C) 97.8 F (36.6 C) (!) 97.3 F (36.3 C)  TempSrc: Oral Oral Oral Oral  SpO2: 99% 96% 96% 99%    General: Pt is alert, awake, not in acute distress,morbidly obese Cardiovascular: RRR, S1/S2 +, no rubs, no gallops Respiratory: CTA bilaterally, no wheezing, no rhonchi Abdominal: Soft, NT, ND, bowel sounds + Extremities: no edema, no cyanosis    The results of significant diagnostics from this hospitalization (including imaging, microbiology, ancillary and laboratory) are listed below for reference.     Microbiology: No results found for this or any previous visit (from the past 240 hour(s)).   Labs: BNP (last 3 results) No results for input(s): "BNP" in the last 8760 hours. Basic Metabolic Panel: Recent Labs  Lab 06/23/22 1150 06/24/22 0532  NA 139 137  K 4.0 3.6  CL 106 106  CO2 23 20*  GLUCOSE 96 91  BUN 8 7  CREATININE 0.76 0.74  CALCIUM 9.5 8.6*   Liver Function Tests: Recent Labs  Lab 06/23/22 1150  AST 43*  ALT 51*  ALKPHOS 85  BILITOT 0.7  PROT 7.2  ALBUMIN 4.3   Recent Labs  Lab 06/23/22 1150  LIPASE 30   No results for input(s): "AMMONIA" in the last 168 hours. CBC: Recent Labs  Lab 06/23/22 1150 06/24/22 0532  WBC 11.6* 8.2  NEUTROABS 5.5  --   HGB 16.2* 14.5  HCT 47.6* 45.9  MCV 91.5 96.4  PLT 311 236   Cardiac Enzymes: No results for input(s): "CKTOTAL", "CKMB", "CKMBINDEX", "TROPONINI" in the last 168 hours. BNP: Invalid input(s): "POCBNP" CBG: No results for input(s): "GLUCAP" in the last 168 hours. D-Dimer No results for input(s): "DDIMER" in the last 72 hours. Hgb A1c No results for input(s): "HGBA1C" in the last 72 hours. Lipid  Profile No results for input(s): "CHOL", "HDL", "LDLCALC", "TRIG", "CHOLHDL", "LDLDIRECT" in the last 72 hours. Thyroid function studies No results for input(s): "TSH", "T4TOTAL", "T3FREE", "THYROIDAB" in the last 72 hours.  Invalid input(s): "FREET3" Anemia work up No results for input(s): "VITAMINB12", "FOLATE", "FERRITIN", "TIBC", "IRON", "RETICCTPCT" in the last 72 hours. Urinalysis    Component Value Date/Time   COLORURINE YELLOW 06/23/2022 1150   APPEARANCEUR CLEAR 06/23/2022 1150   LABSPEC 1.020 06/23/2022 1150   PHURINE 5.0 06/23/2022 1150   GLUCOSEU NEGATIVE 06/23/2022 1150   HGBUR MODERATE (A) 06/23/2022 1150   BILIRUBINUR NEGATIVE 06/23/2022 1150   KETONESUR NEGATIVE 06/23/2022 1150   PROTEINUR NEGATIVE 06/23/2022 1150   NITRITE NEGATIVE 06/23/2022 1150   LEUKOCYTESUR TRACE (A) 06/23/2022 1150   Sepsis Labs Recent Labs  Lab 06/23/22 1150 06/24/22 0532  WBC 11.6* 8.2   Microbiology No results found for this or any previous visit (from the past 240 hour(s)).  Please note: You were cared for by a hospitalist during your hospital stay. Once you are discharged, your primary care physician will handle any further medical issues. Please  note that NO REFILLS for any discharge medications will be authorized once you are discharged, as it is imperative that you return to your primary care physician (or establish a relationship with a primary care physician if you do not have one) for your post hospital discharge needs so that they can reassess your need for medications and monitor your lab values.    Time coordinating discharge: 40 minutes  SIGNED:   Shelly Coss, MD  [facility] 06/25/2022, 12:54 PM Pager [phone]  If 7PM-7AM, please contact night-coverage [email or web address] Password [email or web address]

## 2022-06-26 ENCOUNTER — Other Ambulatory Visit (HOSPITAL_COMMUNITY): Payer: Self-pay

## 2022-07-02 ENCOUNTER — Other Ambulatory Visit (HOSPITAL_BASED_OUTPATIENT_CLINIC_OR_DEPARTMENT_OTHER): Payer: Self-pay | Admitting: Family Medicine

## 2022-07-02 DIAGNOSIS — R7401 Elevation of levels of liver transaminase levels: Secondary | ICD-10-CM

## 2022-07-06 ENCOUNTER — Other Ambulatory Visit (HOSPITAL_COMMUNITY): Payer: Self-pay

## 2022-07-10 ENCOUNTER — Ambulatory Visit (HOSPITAL_BASED_OUTPATIENT_CLINIC_OR_DEPARTMENT_OTHER): Admission: RE | Admit: 2022-07-10 | Payer: 59 | Source: Ambulatory Visit

## 2022-07-11 ENCOUNTER — Ambulatory Visit (HOSPITAL_COMMUNITY): Payer: 59

## 2022-07-11 ENCOUNTER — Encounter (HOSPITAL_COMMUNITY): Payer: Self-pay

## 2022-07-22 ENCOUNTER — Ambulatory Visit (HOSPITAL_COMMUNITY): Payer: 59

## 2022-07-23 ENCOUNTER — Emergency Department (HOSPITAL_BASED_OUTPATIENT_CLINIC_OR_DEPARTMENT_OTHER)
Admission: EM | Admit: 2022-07-23 | Discharge: 2022-07-23 | Disposition: A | Discharge status: Home / Self Care | Payer: 59 | Attending: Emergency Medicine | Admitting: Emergency Medicine

## 2022-07-23 ENCOUNTER — Other Ambulatory Visit: Payer: Self-pay

## 2022-07-23 ENCOUNTER — Encounter (HOSPITAL_BASED_OUTPATIENT_CLINIC_OR_DEPARTMENT_OTHER): Payer: Self-pay

## 2022-07-23 ENCOUNTER — Emergency Department (HOSPITAL_BASED_OUTPATIENT_CLINIC_OR_DEPARTMENT_OTHER): Payer: 59

## 2022-07-23 DIAGNOSIS — R1084 Generalized abdominal pain: Secondary | ICD-10-CM | POA: Diagnosis not present

## 2022-07-23 DIAGNOSIS — Z7901 Long term (current) use of anticoagulants: Secondary | ICD-10-CM | POA: Insufficient documentation

## 2022-07-23 DIAGNOSIS — M5441 Lumbago with sciatica, right side: Secondary | ICD-10-CM | POA: Diagnosis not present

## 2022-07-23 DIAGNOSIS — Z91040 Latex allergy status: Secondary | ICD-10-CM | POA: Diagnosis not present

## 2022-07-23 DIAGNOSIS — D72829 Elevated white blood cell count, unspecified: Secondary | ICD-10-CM | POA: Insufficient documentation

## 2022-07-23 DIAGNOSIS — R109 Unspecified abdominal pain: Secondary | ICD-10-CM | POA: Diagnosis present

## 2022-07-23 LAB — CBC WITH DIFFERENTIAL/PLATELET
Abs Immature Granulocytes: 0.04 10*3/uL (ref 0.00–0.07)
Basophils Absolute: 0.1 10*3/uL (ref 0.0–0.1)
Basophils Relative: 1 %
Eosinophils Absolute: 0.3 10*3/uL (ref 0.0–0.5)
Eosinophils Relative: 2 %
HCT: 50.4 % — ABNORMAL HIGH (ref 36.0–46.0)
Hemoglobin: 17.1 g/dL — ABNORMAL HIGH (ref 12.0–15.0)
Immature Granulocytes: 0 %
Lymphocytes Relative: 33 %
Lymphs Abs: 3.9 10*3/uL (ref 0.7–4.0)
MCH: 31 pg (ref 26.0–34.0)
MCHC: 33.9 g/dL (ref 30.0–36.0)
MCV: 91.3 fL (ref 80.0–100.0)
Monocytes Absolute: 0.8 10*3/uL (ref 0.1–1.0)
Monocytes Relative: 7 %
Neutro Abs: 6.6 10*3/uL (ref 1.7–7.7)
Neutrophils Relative %: 57 %
Platelets: 308 10*3/uL (ref 150–400)
RBC: 5.52 MIL/uL — ABNORMAL HIGH (ref 3.87–5.11)
RDW: 14 % (ref 11.5–15.5)
WBC: 11.6 10*3/uL — ABNORMAL HIGH (ref 4.0–10.5)
nRBC: 0 % (ref 0.0–0.2)

## 2022-07-23 LAB — BASIC METABOLIC PANEL
Anion gap: 12 (ref 5–15)
BUN: 9 mg/dL (ref 6–20)
CO2: 27 mmol/L (ref 22–32)
Calcium: 10.2 mg/dL (ref 8.9–10.3)
Chloride: 101 mmol/L (ref 98–111)
Creatinine, Ser: 0.92 mg/dL (ref 0.44–1.00)
GFR, Estimated: >60 mL/min (ref >60)
Glucose, Bld: 102 mg/dL — ABNORMAL HIGH (ref 70–99)
Potassium: 3.5 mmol/L (ref 3.5–5.1)
Sodium: 140 mmol/L (ref 135–145)

## 2022-07-23 LAB — HCG, SERUM, QUALITATIVE: Preg, Serum: NEGATIVE

## 2022-07-23 MED ORDER — LIDOCAINE 5 % EX PTCH
1.0000 | MEDICATED_PATCH | Freq: Once | CUTANEOUS | Status: DC
  Administered 2022-07-23: 1 via TRANSDERMAL
  Filled 2022-07-23: qty 1

## 2022-07-23 MED ORDER — MORPHINE SULFATE (PF) 2 MG/ML IV SOLN
2.0000 mg | Freq: Once | INTRAVENOUS | Status: AC
  Administered 2022-07-23: 2 mg via INTRAVENOUS
  Filled 2022-07-23: qty 1

## 2022-07-23 MED ORDER — IOHEXOL 300 MG/ML  SOLN
100.0000 mL | Freq: Once | INTRAMUSCULAR | Status: AC | PRN
  Administered 2022-07-23: 100 mL via INTRAVENOUS

## 2022-07-23 MED ORDER — CYCLOBENZAPRINE HCL 7.5 MG PO TABS: 7.5000 mg | ORAL_TABLET | Freq: Three times a day (TID) | ORAL | 0 refills | Status: DC | PRN

## 2022-07-23 MED ORDER — OXYCODONE-ACETAMINOPHEN 5-325 MG PO TABS
1.0000 | ORAL_TABLET | Freq: Once | ORAL | Status: AC
  Administered 2022-07-23: 1 via ORAL
  Filled 2022-07-23: qty 1

## 2022-07-23 MED ORDER — LIDOCAINE 4 % EX PTCH: 1.0000 | MEDICATED_PATCH | CUTANEOUS | 0 refills | Status: DC

## 2022-07-23 MED ORDER — OXYCODONE HCL 5 MG PO TABS: 5.0000 mg | ORAL_TABLET | Freq: Four times a day (QID) | ORAL | 0 refills | Status: AC | PRN

## 2022-07-23 MED ORDER — PREDNISONE 20 MG PO TABS: 40.0000 mg | ORAL_TABLET | Freq: Every day | ORAL | 0 refills | Status: AC

## 2022-07-23 MED ORDER — DEXAMETHASONE SODIUM PHOSPHATE 10 MG/ML IJ SOLN
10.0000 mg | Freq: Once | INTRAMUSCULAR | Status: AC
  Administered 2022-07-23: 10 mg via INTRAMUSCULAR
  Filled 2022-07-23: qty 1

## 2022-07-23 NOTE — ED Provider Notes (Signed)
[place] Provider Note   CSN: [id number] Arrival date & time: 07/23/22  1106     History  Chief Complaint  Patient presents with   Flank Pain    Janice Wolf is a 42 y.o. female who presents emergency department with concern for right flank pain onset yesterday.  Denies recent fall, injury, trauma, heavy lifting.  She has been evaluated by orthopedics before for her back pain and was diagnosed with degenerative disc disease.  She has been treated with prescription Flexeril, oxycodone, methocarbamol.  Denies past medical history of sciatica.  Notes that she was last evaluated by her orthopedist 2 weeks ago with a steroid shot that alleviated her symptoms.  Was informed to go to physical therapy prior to repeat appoint with orthopedist.  Notes that she has been compliant with her medications however has missed some doses of her Eliquis.  Denies urinary symptoms, chest pain, shortness of breath, vomiting.     The history is provided by the patient. No language interpreter was used.       Home Medications Prior to Admission medications   Medication Sig Start Date End Date Taking? Authorizing Provider  predniSONE (DELTASONE) 20 MG tablet Take 2 tablets (40 mg total) by mouth daily for 5 days. 07/23/22 07/28/22 Yes Carletha Dawn A, PA-C  apixaban (ELIQUIS) 5 MG TABS tablet Take 2 tablets (10 mg total) by mouth 2 (two) times daily for 6 days. 06/25/22 07/01/22  Shelly Coss, MD  apixaban (ELIQUIS) 5 MG TABS tablet Take 1 tablet (5 mg total) by mouth 2 (two) times daily. 07/01/22   Shelly Coss, MD  Armodafinil 50 MG tablet Take 50 mg by mouth daily. 03/11/22   [provider]  butalbital-acetaminophen-caffeine (FIORICET) 50-325-40 MG tablet Take 1 tablet by mouth every 6 (six) hours as needed for headache. 06/25/22   Shelly Coss, MD  CALCIUM PO Take 3 tablets by mouth daily.    [provider]  clindamycin (CLEOCIN T)  1 % lotion Apply 1 Application topically daily as needed (bumps in vaginal area). 01/27/22   [provider]  cyclobenzaprine (FEXMID) 7.5 MG tablet Take 1 tablet (7.5 mg total) by mouth 3 (three) times daily as needed for muscle spasms (right flank pain). 07/23/22   Nafisa Olds A, PA-C  esomeprazole (NEXIUM) 20 MG capsule Take 20 mg by mouth daily.    [provider]  fluticasone (FLONASE) 50 MCG/ACT nasal spray Place 1 spray into both nostrils daily as needed for allergies. 01/05/22   [provider]  gabapentin (NEURONTIN) 300 MG capsule Take 300 mg by mouth 2 (two) times daily. 04/24/19   [provider]  levonorgestrel (MIRENA) 20 MCG/24HR IUD 1 each by Intrauterine route once.    [provider]  levothyroxine (SYNTHROID) 100 MCG tablet Take 100 mcg by mouth daily before breakfast.    [provider]  lidocaine (HM LIDOCAINE PATCH) 4 % Place 1 patch onto the skin daily. Apply on the painful area of right upper back 07/23/22   Brazil Voytko A, PA-C  lidocaine (XYLOCAINE) 5 % ointment 1 Application daily as needed (Foot pain). 03/17/19   [provider]  Multiple Vitamin (MULTIVITAMIN) tablet Take 2 tablets by mouth daily.    [provider]  nystatin cream (MYCOSTATIN) Apply 1 Application topically daily as needed (fever blisters). 01/09/22   [provider]  ondansetron (ZOFRAN-ODT) 4 MG disintegrating tablet Take 1 tablet (4 mg total) by mouth every  6 (six) hours as needed for nausea or vomiting. 04/02/22   Greer Pickerel, MD  oxyCODONE (OXY IR/ROXICODONE) 5 MG immediate release tablet Take 1 tablet (5 mg total) by mouth every 6 (six) hours as needed for severe pain. 07/23/22   Lakayla Barrington A, PA-C      Allergies    Aspirin, Latex, and Other    Review of Systems   Review of Systems  Respiratory:  Negative for shortness of breath.   Cardiovascular:  Negative for chest pain.  Gastrointestinal:  Positive for nausea  (resolved yesterday). Negative for vomiting.  Genitourinary:  Positive for flank pain. Negative for dysuria and hematuria.  All other systems reviewed and are negative.   Physical Exam Updated Vital Signs BP (!) 104/93 (BP Location: Left Arm)   Pulse (!) 110   Temp 97.8 F (36.6 C)   Resp 16   Ht 5\' 7"  (1.702 m)   Wt (!) 137 kg   SpO2 96%   BMI 47.30 kg/m  Physical Exam Vitals and nursing note reviewed.  Constitutional:      General: She is not in acute distress.    Appearance: Normal appearance.  Eyes:     General: No scleral icterus.    Extraocular Movements: Extraocular movements intact.  Cardiovascular:     Rate and Rhythm: Normal rate and regular rhythm.     Pulses: Normal pulses.     Heart sounds: Normal heart sounds.  Pulmonary:     Effort: Pulmonary effort is normal. No respiratory distress.     Breath sounds: Normal breath sounds.  Abdominal:     General: Bowel sounds are normal.     Palpations: Abdomen is soft.     Tenderness: There is abdominal tenderness.     Comments: Mild diffuse abdominal tenderness to palpation  Musculoskeletal:     Cervical back: Neck supple.     Comments: Tenderness to palpation to lumbar spine. No obvious deformity, effusion, erythema, or swelling. No tenderness to palpation to C, T spine. Strength and sensation intact to bilateral lower extremities. + SLR bilaterally, more so to right side. Mild TTP noted to right anterior hip without overlying skin changes. Normal active flexion and extension of RLE without difficulty.   Skin:    General: Skin is warm and dry.     Findings: No bruising, erythema or rash.  Neurological:     Mental Status: She is alert.  Psychiatric:        Behavior: Behavior normal.     ED Results / Procedures / Treatments   Labs (all labs ordered are listed, but only abnormal results are displayed) Labs Reviewed  BASIC METABOLIC PANEL - Abnormal; Notable for the following components:      Result Value    Glucose, Bld 102 (*)    All other components within normal limits  CBC WITH DIFFERENTIAL/PLATELET - Abnormal; Notable for the following components:   WBC 11.6 (*)    RBC 5.52 (*)    Hemoglobin 17.1 (*)    HCT 50.4 (*)    All other components within normal limits  HCG, SERUM, QUALITATIVE  URINALYSIS, ROUTINE W REFLEX MICROSCOPIC    EKG None  Radiology CT ABDOMEN PELVIS W CONTRAST  Result Date: 07/23/2022 CLINICAL DATA:  Right flank pain EXAM: CT ABDOMEN AND PELVIS WITH CONTRAST TECHNIQUE: Multidetector CT imaging of the abdomen and pelvis was performed using the standard protocol following bolus administration of intravenous contrast. RADIATION DOSE REDUCTION: This exam was performed according to the  departmental dose-optimization program which includes automated exposure control, adjustment of the mA and/or kV according to patient size and/or use of iterative reconstruction technique. CONTRAST:  196mL OMNIPAQUE IOHEXOL 300 MG/ML  SOLN COMPARISON:  CT 06/23/2022 FINDINGS: Lower chest: No acute abnormality. Hepatobiliary: Liver is enlarged measuring 23 cm. Heterogenous hypodensity consistent with hepatic steatosis. Status post cholecystectomy. No biliary dilatation Pancreas: Unremarkable. No pancreatic ductal dilatation or surrounding inflammatory changes. Spleen: Similar borderline to slight enlargement at 13 cm. Adrenals/Urinary Tract: Adrenal glands are unremarkable. Kidneys are normal, without renal calculi, focal lesion, or hydronephrosis. Bladder is unremarkable. Stomach/Bowel: Status post gastric bypass. No evidence for bowel obstruction or bowel wall thickening. Vascular/Lymphatic: No significant vascular findings are present. No enlarged abdominal or pelvic lymph nodes. Reproductive: IUD in the uterus.  No adnexal mass Other: Negative for pelvic effusion or free air. Ovoid fat density lesion within the anterior omentum slightly below the level of umbilicus, this measures 3.2 cm compared with  4.3 cm previously and could reflect evolving omental infarct. Musculoskeletal: Chronic bilateral pars defect at L5. IMPRESSION: 1. No CT evidence for acute intra-abdominal or pelvic abnormality. 2. Hepatomegaly with hepatic steatosis. Similar borderline to slight splenomegaly. 3. Slight interval decrease in size of heterogenous area of focal fat density with mild stranding at the omentum potentially representing evolving omental infarct. Electronically Signed   By: Donavan Foil M.D.   On: 07/23/2022 17:03    Procedures Procedures    Medications Ordered in ED Medications  lidocaine (LIDODERM) 5 % 1 patch (1 patch Transdermal Patch Applied 07/23/22 1254)  dexamethasone (DECADRON) injection 10 mg (10 mg Intramuscular Given 07/23/22 1255)  oxyCODONE-acetaminophen (PERCOCET/ROXICET) 5-325 MG per tablet 1 tablet (1 tablet Oral Given 07/23/22 1254)  morphine (PF) 2 MG/ML injection 2 mg (2 mg Intravenous Given 07/23/22 1543)  iohexol (OMNIPAQUE) 300 MG/ML solution 100 mL (100 mLs Intravenous Contrast Given 07/23/22 1627)    ED Course/ Medical Decision Making/ A&P Clinical Course as of 07/23/22 1859  Thu Jul 23, 2022  1537 Re-evaluated and noted pain with movement.  [SB]  1643 Re-evaluated and noted improvement of symptoms with treatment regimen in the ED.  Patient notes that her pain has improved from a 10 to a 8.  She also notes that her pain is at a 3 when she is not moving. [SB]  1718 Re-evaluated and asleep on stretcher. Pt noted pain has improved at this time.  [SB]  1727 Discussed with patient discharge treatment plan.   [SB]  1727 Answered all available questions.  Patient appears safe for discharge at this time. [SB]  1747 In depth conversation with patient regarding lab and imaging findings. Discussed with patient in detail regarding recommendations to continue with her eliquis as prescribed as well as follow up with primary care provider regarding todays ED visit. Also recommended patient  follow up with her orthopedist at [facility] regarding todays ED visit. Answered all available questions. [SB]  1820 Per RN pt ambulatory at discharge.  [SB]    Clinical Course User Index [SB] Lakeena Downie A, PA-C                             Medical Decision Making Amount and/or Complexity of Data Reviewed Labs: ordered. Radiology: ordered.  Risk OTC drugs. Prescription drug management.   Patient with right lumbar back pain with radiation to right lower extremity. Pt without history of sciatica. No loss of bowel or bladder control.  No  concern for cauda equina.  No fever, night sweats, weight loss, h/o cancer, IVDA, no recent procedure to back. No urinary symptoms suggestive of UTI.  Vital signs, pt afebrile. On exam, patient with Tenderness to palpation to lumbar spine. No obvious deformity, effusion, erythema, or swelling. No tenderness to palpation to C, T spine. Strength and sensation intact to bilateral lower extremities. + SLR bilaterally, more so to right side. Mild TTP noted to right anterior hip without overlying skin changes. Normal active flexion and extension of RLE without difficulty. Differential diagnosis includes but is not limited to fracture, herniation, muscle strain, cauda equina, pancreatitis, appendicitis, pyelonephritis, nephrolithiasis, diverticulitis.    Labs:  I ordered, and personally interpreted labs.  The pertinent results include:   CBC with slightly elevated leukocytosis at 11.6, stable from previous value BMP unremarkable. Negative hCG  Imaging: I ordered imaging studies including CT abdomen pelvis I independently visualized and interpreted imaging which showed:  1. No CT evidence for acute intra-abdominal or pelvic abnormality.  2. Hepatomegaly with hepatic steatosis. Similar borderline to slight  splenomegaly.  3. Slight interval decrease in size of heterogenous area of focal  fat density with mild stranding at the omentum potentially   representing evolving omental infarct.   I agree with the radiologist interpretation  Medications:  I ordered medication including morphine, Decadron, Percocet, Lidoderm patch, warm compress for pain management Reevaluation of the patient after these medicines and interventions, I reevaluated the patient and found that they have improved I have reviewed the patients home medicines and have made adjustments as needed    Disposition: Presentation suspicious for sciatica.  Doubt concerns at this time for fracture, herniation.  Also suspicious for muscle strain.  Doubt concerns for cauda equina, pancreatitis, appendicitis, pyelonephritis, nephrolithiasis, diverticulitis.  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 After consideration of the diagnostic results and the patients response to treatment, I feel that the patient would benefit from Discharge home. Work note provided.  Patient sent a prescription for lidocaine, Fexmid, prednisone.  PDMP reviewed, short course of oxycodone sent to patient's pharmacy.  Instructed patient to follow-up with [facility] regarding today's ED visit.  Also voiced importance of patient continuation of her Eliquis as prescribed.  Discussed importance of follow-up with primary care provider for management.  Supportive care measures and strict return precautions discussed with patient at bedside. Pt acknowledges and verbalizes understanding. Pt appears safe for discharge. Follow up as indicated in discharge paperwork.    This chart was dictated using voice recognition software, Dragon. Despite the best efforts of this provider to proofread and correct errors, errors may still occur which can change documentation meaning.   Final Clinical Impression(s) / ED Diagnoses Final diagnoses:  Acute right-sided low back pain with right-sided sciatica    Rx / DC Orders ED Discharge Orders           Ordered    oxyCODONE (OXY IR/ROXICODONE) 5 MG immediate release tablet  Every 6 hours PRN        07/23/22 1827    lidocaine (HM LIDOCAINE PATCH) 4 %  Every 24 hours        07/23/22 1827    cyclobenzaprine (FEXMID) 7.5 MG tablet  3 times daily PRN        07/23/22 1827    predniSONE (DELTASONE) 20 MG tablet  Daily        07/23/22 1827  Wilbert Hayashi A, PA-C 07/23/22 1903    Jeanell Sparrow, DO 07/28/22 [phone]

## 2022-07-23 NOTE — ED Notes (Addendum)
Patient ambulatory in hallway without difficulty.  [place] to get up or sit down.

## 2022-07-23 NOTE — ED Notes (Signed)
BSC in room pt attempting to get [place] a urine

## 2022-07-23 NOTE — ED Triage Notes (Signed)
Patient here POV from Home.  Endorses Right Lower Flank/Hip Pain that began 2-3 Days ago. No Discernable Trauma or Injury. No Dysuria. No Hematuria. Radiates to Right Leg.   Currently taking Eliquis for PE.  NAD Noted during Triage. A&Ox4. GCS 15. BIB Wheelchair.

## 2022-07-23 NOTE — ED Notes (Signed)
Placed on O2 via [place] for sats dropping to 88% after pain meds

## 2022-07-23 NOTE — Discharge Instructions (Addendum)
It was a pleasure taking care of you today!   Your labs showed slightly elevated white blood cells, however, the same as your last visit. Your CT scan showed similar concerns that were noted on the previous CT in February of the omental infacrt (the belly apron), this is looking improved from previous CT scan. This is treated with the Eliquis that you are prescribed, it is important that you do not miss any doses of your prescribed Eliquis.  Call your orthopedist at [facility] tomorrow to set up a follow-up appointment regarding today's ED visit.  You will be sent a prescription for Oxycodone, take as directed for breakthrough pain.  You will also be sent a refill of your Fexmid.  Do not drive or operate heavy machinery while taking this medication as it can make you sleepy/drowsy.  Do not take the Fexmid and the Oxycodone at the same time as it can have increased drowsiness.  You will be sent a prescription for Lidoderm patch and prednisone use as directed.  You may place a warm compress to the affected area up to 15 minutes at a time, sure to place a barrier between your skin and the warm compress.  Follow-up with your primary care provider regarding today's ED visit.  Return to the emergency department if you experience increasing/worsening symptoms.

## 2022-07-28 ENCOUNTER — Ambulatory Visit (HOSPITAL_COMMUNITY)
Admission: RE | Admit: 2022-07-28 | Discharge: 2022-07-28 | Disposition: A | Discharge status: Home / Self Care | Payer: 59 | Source: Ambulatory Visit | Attending: Family Medicine | Admitting: Family Medicine

## 2022-07-28 DIAGNOSIS — R7401 Elevation of levels of liver transaminase levels: Secondary | ICD-10-CM | POA: Diagnosis not present

## 2022-08-03 ENCOUNTER — Other Ambulatory Visit (HOSPITAL_COMMUNITY): Payer: Self-pay

## 2022-09-24 ENCOUNTER — Encounter: Payer: 59 | Attending: General Surgery | Admitting: Dietician

## 2022-09-24 ENCOUNTER — Encounter: Payer: Self-pay | Admitting: Dietician

## 2022-09-24 VITALS — Ht 67.5 in | Wt 266.2 lb

## 2022-09-24 DIAGNOSIS — E669 Obesity, unspecified: Secondary | ICD-10-CM | POA: Insufficient documentation

## 2022-09-24 NOTE — Progress Notes (Signed)
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 No Bowel Habits: Every day to every other day no complaints   Body Composition Scale 04/14/2022 05/26/2022 09/24/2022  Current Body Weight 326.1 302.7 266.2  Total Body Fat % 49.1 47.5 44.6  Visceral Fat 19 17 14   Fat-Free Mass % 50.8 52.4 55.3   Total Body Water % 39.9 40.7 42.1  Muscle-Mass lbs 35.4 35.2 34.8  BMI 50.8 47.1 41.4  Body Fat Displacement            Torso  lbs 99.4 89.4 73.7         Left Leg  lbs 19.8 17.8 14.7         Right Leg  lbs 19.8 17.8 14.7         Left Arm  lbs 9.9 8.9 7.3         Right Arm   lbs 9.9 8.9 7.3     Lifestyle & Dietary Hx  Pt states she ended up with blood clots and muscle spasms in January and February. Pt states she is taking Eliquis now. Pt states she gets plenty of protein, stating she does not know how many grams of protein she gets in a day. Pt states she works 12 hours a day, six days a week. Pt states she can not tolerate milk products and alfredo sauces, stating she can drink skim/fat-free Fairlife milk.  Estimated daily fluid intake: 64 oz Estimated daily protein intake: pt states not sure how much per day Supplements: multivitamin w/ iron and calcium; vit B and Iron additionally Current average weekly physical activity: back full time at work, stating she completes all her  circles on her watch when she is at work. Pt states she has not been back at the gym since surgery.  24-Hr Dietary Recall First Meal: skip Snack:  Second Meal: small lunch of grilled chicken and vegetable Snack:  Third Meal: steak or chicken or ground chicken Snack:  Beverages: zero sugar tea, water with flavorings, water, ice, fat free Fairlife milk  Post-Op Goals/ Signs/ Symptoms Using straws: no Drinking while eating: no Chewing/swallowing difficulties: no Changes in vision: no Changes to mood/headaches: no Hair loss/changes to skin/nails: no Difficulty focusing/concentrating: no Sweating: no Limb weakness: no Dizziness/lightheadedness: no Palpitations: no  Carbonated/caffeinated beverages: no N/V/D/C/Gas: no Abdominal pain: no Dumping syndrome: no    NUTRITION DIAGNOSIS  Overweight/obesity ([place]-3.3) related to past poor dietary habits and physical inactivity as evidenced by completed bariatric surgery and following dietary guidelines for continued weight loss and healthy nutrition status.     NUTRITION INTERVENTION Nutrition counseling (C-1) and education (E-2) to facilitate bariatric surgery goals, including: Diet advancement to standard prep plan and maintenance. The importance of consuming adequate calories as well as certain nutrients daily due to the body's need for essential vitamins, minerals, and fats The importance of daily physical activity and to reach a goal of at least 150 minutes of moderate to vigorous  physical activity weekly (or as directed by their physician) due to benefits such as increased musculature and improved lab values The importance of intuitive eating specifically learning hunger-satiety cues and understanding the importance of learning a new body: The importance of mindful eating to avoid grazing behaviors  Encouraged patient to honor their body's internal hunger and fullness cues.  Throughout the day, check in mentally and rate hunger. Stop  eating when satisfied not full regardless of how much food is left on the plate.  Get more if still hungry 20-30 minutes later.  The key is to honor satisfaction so throughout the meal, rate fullness factor and stop when comfortably satisfied not physically full. The key is to honor hunger and fullness without any feelings of guilt or shame.  Pay attention to what the internal cues are, rather than any external factors. This will enhance the confidence you have in listening to your own body and following those internal cues enabling you to increase how often you eat when you are hungry not out of appetite and stop when you are satisfied not full.  Encouraged pt to continue to eat balanced meals inclusive of non starchy vegetables 2 times a day 7 days a week Encouraged pt to choose lean protein sources: limiting beef, pork, sausage, hotdogs, and lunch meat Encourage pt to choose healthy fats such as plant based limiting animal fats Encouraged pt to continue to drink a minium 64 fluid ounces with half being plain water to satisfy proper hydration   Why you need complex carbohydrates: Complex carbohydrates are required to have a healthy diet. They provide fiber which can help with blood glucose levels and help keep you satiated. Fruits and starchy vegetables provide essential vitamins and minerals required for immune function, eyesight support, brain support, bone density, wound healing and many other functions within the body. According to the current evidenced based 2020-2025 Dietary Guidelines for Americans, complex carbohydrates are part of a healthy eating pattern which is associated with a decreased risk for type 2 diabetes, cancers, and cardiovascular disease.  Purpose of protein: Every cell in your body has protein. Protein is essential for the structure, function and regulation of tissues and organs within the body. Without protein enzymes and antibodies would not exist, and cells would lack storage,  transportation, and messenger systems. According to [facility]. [employer], the body is made up of at least 10000 different proteins. Lack of protein can lead to growth failure in children, loss of muscle mass, decreased immune system function, and overall weakening of various organs in the body.  SearchEngineCritic.nl, DoubleProperty.com.cy, PokerProtocol.pl Physical Activity: Aim for 150 minutes of moderate physical activity weekly. Regular physical activity promotes overall health-including helping to reduce risk for heart disease and diabetes, promoting mental health, and helping [place] sleep better. Helps [place] to maintain muscle mass; avoiding muscle loss while we lose weight.  Goals -Continue: getting 64 oz of fluids, mostly plain water -New: Track protein; have a protein source with each meal and snack -New: Get some breakfast, avoid skipping meals; include an afternoon snack to prevent hunger at dinner.  Handouts Provided Include  6 Months to Maintenance handout Protein foods list with protein values  Learning Style & Readiness for Change Teaching method utilized: Visual & Auditory  Demonstrated degree of understanding via: Teach Back  Readiness Level: preparation Barriers to learning/adherence to lifestyle change: back injury/spasms  RD's Notes for Next Visit Assess adherence to pt chosen goals  MONITORING & EVALUATION Dietary  intake, weekly physical activity, body weight.  Next Steps Patient requested to follow-up in 6 months for one year post-op follow-up.

## 2023-03-11 ENCOUNTER — Ambulatory Visit: Payer: 59 | Admitting: Dietician

## 2023-09-10 ENCOUNTER — Encounter (HOSPITAL_BASED_OUTPATIENT_CLINIC_OR_DEPARTMENT_OTHER): Payer: Self-pay | Admitting: Emergency Medicine

## 2023-09-10 ENCOUNTER — Emergency Department (HOSPITAL_BASED_OUTPATIENT_CLINIC_OR_DEPARTMENT_OTHER): Admitting: Radiology

## 2023-09-10 ENCOUNTER — Emergency Department (HOSPITAL_BASED_OUTPATIENT_CLINIC_OR_DEPARTMENT_OTHER)

## 2023-09-10 ENCOUNTER — Other Ambulatory Visit: Payer: Self-pay

## 2023-09-10 ENCOUNTER — Emergency Department (HOSPITAL_BASED_OUTPATIENT_CLINIC_OR_DEPARTMENT_OTHER)
Admission: EM | Admit: 2023-09-10 | Discharge: 2023-09-10 | Disposition: A | Discharge status: Home / Self Care | Attending: Emergency Medicine | Admitting: Emergency Medicine

## 2023-09-10 DIAGNOSIS — Z7901 Long term (current) use of anticoagulants: Secondary | ICD-10-CM | POA: Diagnosis not present

## 2023-09-10 DIAGNOSIS — R079 Chest pain, unspecified: Secondary | ICD-10-CM | POA: Insufficient documentation

## 2023-09-10 DIAGNOSIS — E039 Hypothyroidism, unspecified: Secondary | ICD-10-CM | POA: Diagnosis not present

## 2023-09-10 DIAGNOSIS — Z79899 Other long term (current) drug therapy: Secondary | ICD-10-CM | POA: Insufficient documentation

## 2023-09-10 DIAGNOSIS — R1013 Epigastric pain: Secondary | ICD-10-CM | POA: Diagnosis present

## 2023-09-10 DIAGNOSIS — Z91040 Latex allergy status: Secondary | ICD-10-CM | POA: Insufficient documentation

## 2023-09-10 HISTORY — DX: Activated protein C resistance: D68.51

## 2023-09-10 LAB — CBC
HCT: 45.6 % (ref 36.0–46.0)
Hemoglobin: 15.6 g/dL — ABNORMAL HIGH (ref 12.0–15.0)
MCH: 31.5 pg (ref 26.0–34.0)
MCHC: 34.2 g/dL (ref 30.0–36.0)
MCV: 91.9 fL (ref 80.0–100.0)
Platelets: 272 10*3/uL (ref 150–400)
RBC: 4.96 MIL/uL (ref 3.87–5.11)
RDW: 12.7 % (ref 11.5–15.5)
WBC: 9.3 10*3/uL (ref 4.0–10.5)
nRBC: 0 % (ref 0.0–0.2)

## 2023-09-10 LAB — BASIC METABOLIC PANEL WITH GFR
Anion gap: 13 (ref 5–15)
BUN: 12 mg/dL (ref 6–20)
CO2: 25 mmol/L (ref 22–32)
Calcium: 10 mg/dL (ref 8.9–10.3)
Chloride: 102 mmol/L (ref 98–111)
Creatinine, Ser: 0.73 mg/dL (ref 0.44–1.00)
GFR, Estimated: >60 mL/min (ref >60)
Glucose, Bld: 81 mg/dL (ref 70–99)
Potassium: 3.8 mmol/L (ref 3.5–5.1)
Sodium: 140 mmol/L (ref 135–145)

## 2023-09-10 LAB — TROPONIN T, HIGH SENSITIVITY
Troponin T High Sensitivity: <15 ng/L (ref <19)
Troponin T High Sensitivity: <15 ng/L (ref <19)

## 2023-09-10 LAB — D-DIMER, QUANTITATIVE: D-Dimer, Quant: 0.6 ug{FEU}/mL — ABNORMAL HIGH (ref 0.00–0.50)

## 2023-09-10 LAB — PREGNANCY, URINE: Preg Test, Ur: NEGATIVE

## 2023-09-10 MED ORDER — ACETAMINOPHEN 500 MG PO TABS: 1000.0000 mg | ORAL_TABLET | Freq: Once | ORAL | Status: DC

## 2023-09-10 MED ORDER — PANTOPRAZOLE SODIUM 40 MG IV SOLR
40.0000 mg | Freq: Once | INTRAVENOUS | Status: AC
  Administered 2023-09-10: 40 mg via INTRAVENOUS
  Filled 2023-09-10: qty 10

## 2023-09-10 MED ORDER — IOHEXOL 350 MG/ML SOLN
75.0000 mL | Freq: Once | INTRAVENOUS | Status: AC | PRN
  Administered 2023-09-10: 75 mL via INTRAVENOUS

## 2023-09-10 NOTE — Discharge Instructions (Signed)
 While you were in the emergency room, you had blood work and imaging to look for signs of injury to your heart or lungs.  These tests were all normal.  At this time I do not have a clear cause for this causing your symptoms.  I do think taking a medication like famotidine each day could be helpful in case this is some stomach irritation.  This medication can be purchased over-the-counter or prescribed by your primary care doctor.  Please follow-up with your primary care doctor within 1 week.

## 2023-09-10 NOTE — ED Provider Notes (Signed)
 [facility] AT [facility] Provider Note  CSN: [id number] Arrival date & time: 09/10/23 1755  Chief Complaint(s) Chest Pain  HPI Janice Wolf is a 43 y.o. female who is here today with chest pain in the middle of the abdomen that also radiates around the back.  Patient has a history of factor V, was previously on blood thinners but stopped in September.  She says that this sensation is similar to what she felt when she had blood clots.  She has not any shortness of breath or recent travel.  Patient has a history of gastric bypass surgery.   Past Medical History Past Medical History:  Diagnosis Date   Abnormal thyroid  blood test    no treatment yet, per pt.   Anxiety    Arthritis    Back pain    Complication of anesthesia    was hard to wake up after T & A 12/2015; had to stay overnight   Dental crowns present    Depression    Dizziness    Factor 5 Leiden mutation, heterozygous (HCC)    Fatty liver    Gallbladder problem    GERD (gastroesophageal reflux disease)    Hypothyroidism    IBS (irritable bowel syndrome)    Migraines    migraines   Neuromuscular disorder (HCC)    nerve damage in foot   Painful orthopaedic hardware (HCC) 09/2016   left foot   Pneumonia    PONV (postoperative nausea and vomiting)    Sleep apnea    uses cpap   Stuffy nose 09/03/2016   Vitamin D  deficiency    Patient Active Problem List   Diagnosis Date Noted   Pulmonary embolism (HCC) 06/23/2022   Hypothyroidism 06/23/2022   GERD without esophagitis 06/23/2022   Peripheral neuropathy 06/23/2022   Dehydration 06/12/2022   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 Authorizing Provider  apixaban  (ELIQUIS ) 5 MG TABS tablet Take 2 tablets (10 mg total) by mouth 2 (two) times daily for 6 days. 06/25/22 07/01/22   Leona Rake, MD  apixaban  (ELIQUIS ) 5 MG TABS tablet Take 1 tablet (5 mg total) by mouth 2 (two) times daily. 07/01/22   Leona Rake, MD  Armodafinil  50 MG tablet Take 50 mg by mouth daily. 03/11/22   [provider]  butalbital -acetaminophen -caffeine  (FIORICET ) 50-325-40 MG tablet Take 1 tablet by mouth every 6 (six) hours as needed for headache. 06/25/22   Leona Rake, MD  CALCIUM PO Take 3 tablets by mouth daily.    [provider]  clindamycin (CLEOCIN T) 1 % lotion Apply 1 Application topically daily as needed (bumps in vaginal area). 01/27/22   [provider]  cyclobenzaprine  (FEXMID ) 7.5 MG tablet Take 1 tablet (7.5 mg total) by mouth 3 (three) times daily as needed for muscle spasms (right flank pain). 07/23/22   Blue, Soijett A, PA-C  esomeprazole (NEXIUM) 20 MG capsule Take 20 mg by mouth daily.    [provider]  fluticasone  (FLONASE ) 50 MCG/ACT nasal spray Place 1 spray into both nostrils daily as needed for allergies. 01/05/22   [provider]  gabapentin  (NEURONTIN ) 300 MG capsule Take 300 mg by mouth 2 (two) times daily. 04/24/19   [provider]  levonorgestrel  (MIRENA ) 20 MCG/24HR IUD 1 each by Intrauterine route once.    [provider]  levothyroxine  (SYNTHROID )  100 MCG tablet Take 100 mcg by mouth daily before breakfast.    [provider]  lidocaine  (HM LIDOCAINE  PATCH) 4 % Place 1 patch onto the skin daily. Apply on the painful area of right upper back 07/23/22   Blue, Soijett A, PA-C  lidocaine  (XYLOCAINE ) 5 % ointment 1 Application daily as needed (Foot pain). 03/17/19   [provider]  Multiple Vitamin (MULTIVITAMIN) tablet Take 2 tablets by mouth daily.    [provider]  nystatin cream (MYCOSTATIN) Apply 1 Application topically daily as needed (fever blisters). 01/09/22   [provider]  ondansetron  (ZOFRAN -ODT) 4 MG disintegrating tablet Take 1 tablet (4 mg total) by  mouth every 6 (six) hours as needed for nausea or vomiting. 04/02/22   Aldean Hummingbird, MD  oxyCODONE  (OXY IR/ROXICODONE ) 5 MG immediate release tablet Take 1 tablet (5 mg total) by mouth every 6 (six) hours as needed for severe pain. 07/23/22   Blue, Soijett A, PA-C                                                                                                                                    Past Surgical History Past Surgical History:  Procedure Laterality Date   BIOPSY  02/03/2022   Procedure: BIOPSY;  Surgeon: Genell Ken, MD;  Location: [facility];  Service: Gastroenterology;;   CHOLECYSTECTOMY     age 72   COLONOSCOPY WITH PROPOFOL  N/A 02/03/2022   Procedure: COLONOSCOPY WITH PROPOFOL ;  Surgeon: Genell Ken, MD;  Location: [facility];  Service: Gastroenterology;  Laterality: N/A;   ESOPHAGOGASTRODUODENOSCOPY N/A 02/03/2022   Procedure: ESOPHAGOGASTRODUODENOSCOPY (EGD);  Surgeon: Genell Ken, MD;  Location: [facility];  Service: Gastroenterology;  Laterality: N/A;   GASTRIC ROUX-EN-Y N/A 03/31/2022   Procedure: LAPAROSCOPIC ROUX-EN-Y GASTRIC BYPASS WITH UPPER GASTROINTESTINAL ENDOSCOPY;  Surgeon: Aldean Hummingbird, MD;  Location: [facility];  Service: General;  Laterality: N/A;   HARDWARE REMOVAL Left 10/01/2016   Procedure: HARDWARE REMOVAL left foot;  Surgeon: Amada Backer, MD;  Location: [facility];  Service: Orthopedics;  Laterality: Left;   TARSAL METATARSAL ARTHRODESIS Left 02/20/2016   Procedure: FIRST AND SECOND TARSAL METATARSAL ARTHRODESIS;  Surgeon: Amada Backer, MD;  Location: [facility];  Service: Orthopedics;  Laterality: Left;   TONSILLECTOMY AND ADENOIDECTOMY  12/2015   Family History Family History  Problem Relation Age of Onset   Diabetes Mother    Hypertension Mother    Anxiety disorder Mother    Obesity Father    Hyperlipidemia Father    Hypertension Father    Diabetes Father     Social History Social History   Tobacco Use   Smoking  status: Never   Smokeless tobacco: Never  Vaping Use   Vaping status: Never Used  Substance Use Topics   Alcohol use: No   Drug use: No   Allergies Aspirin, Latex, and Other  Review of Systems Review of Systems  Physical Exam Vital Signs  I have reviewed the triage vital signs BP 104/68   Pulse 62   Temp 98 F (36.7 C) (Oral)   Resp 12   SpO2 99%   Physical Exam Vitals and nursing note reviewed.  Cardiovascular:     Rate and Rhythm: Normal rate.     Heart sounds: Normal heart sounds.  Pulmonary:     Effort: Pulmonary effort is normal.     Breath sounds: Normal breath sounds.  Abdominal:     General: There is no abdominal bruit.     Palpations: Abdomen is soft.  Musculoskeletal:        General: Normal range of motion.  Neurological:     Mental Status: She is alert.     ED Results and Treatments Labs (all labs ordered are listed, but only abnormal results are displayed) Labs Reviewed  CBC - Abnormal; Notable for the following components:      Result Value   Hemoglobin 15.6 (*)    All other components within normal limits  D-DIMER, QUANTITATIVE - Abnormal; Notable for the following components:   D-Dimer, Quant 0.60 (*)    All other components within normal limits  BASIC METABOLIC PANEL WITH GFR  PREGNANCY, URINE  TROPONIN T, HIGH SENSITIVITY  TROPONIN T, HIGH SENSITIVITY                                                                                                                          Radiology CT Angio Chest PE W and/or Wo Contrast Result Date: 09/10/2023 CLINICAL DATA:  Suspected pulmonary embolism. EXAM: CT ANGIOGRAPHY CHEST WITH CONTRAST TECHNIQUE: Multidetector CT imaging of the chest was performed using the standard protocol during bolus administration of intravenous contrast. Multiplanar CT image reconstructions and MIPs were obtained to evaluate the vascular anatomy. RADIATION DOSE REDUCTION: This exam was performed according to the departmental  dose-optimization program which includes automated exposure control, adjustment of the mA and/or kV according to patient size and/or use of iterative reconstruction technique. CONTRAST:  75mL OMNIPAQUE  IOHEXOL  350 MG/ML SOLN COMPARISON:  None Available. FINDINGS: Cardiovascular: The thoracic aorta is normal in appearance. Satisfactory opacification of the pulmonary arteries to the segmental level. No evidence of pulmonary embolism. Normal heart size. No pericardial effusion. Mediastinum/Nodes: No enlarged mediastinal, hilar, or axillary lymph nodes. Thyroid  gland, trachea, and esophagus demonstrate no significant findings. Lungs/Pleura: Lungs are clear. No pleural effusion or pneumothorax. Upper Abdomen: There is a small hiatal hernia. Surgical sutures are seen throughout the gastric region. Surgical clips are present within the gallbladder fossa. Musculoskeletal: No chest wall abnormality. No acute or significant osseous findings. Review of the MIP images confirms the above findings. IMPRESSION: 1. No evidence of pulmonary embolism or other acute intrathoracic process. 2. Small hiatal hernia. 3. Evidence of prior cholecystectomy. Electronically Signed   By: Virgle Grime M.D.   On: 09/10/2023 21:08   DG Chest 2 View Result Date: 09/10/2023 CLINICAL DATA:  Chest pain. EXAM: CHEST -  2 VIEW COMPARISON:  June 25, 2022 FINDINGS: The heart size and mediastinal contours are within normal limits. Lung volumes are noted with mild stable scarring and/atelectasis seen along the periphery of the right lung base. No pleural effusion or pneumothorax is identified. Radiopaque surgical clips are seen within the right upper quadrant. The visualized skeletal structures are unremarkable. IMPRESSION: Low lung volumes with mild right basilar scarring and/or atelectasis. Electronically Signed   By: Virgle Grime M.D.   On: 09/10/2023 21:02    Pertinent labs & imaging results that were available during my care of the  patient were reviewed by me and considered in my medical decision making (see MDM for details).  Medications Ordered in ED Medications  acetaminophen  (TYLENOL ) tablet 1,000 mg (has no administration in time range)  pantoprazole  (PROTONIX ) injection 40 mg (40 mg Intravenous Given 09/10/23 2005)  iohexol  (OMNIPAQUE ) 350 MG/ML injection 75 mL (75 mLs Intravenous Contrast Given 09/10/23 2049)                                                                                                                                     Procedures Procedures  (including critical care time)  Medical Decision Making / ED Course   This patient presents to the ED for concern of chest pain, upper abdominal pain, this involves an extensive number of treatment options, and is a complaint that carries with it a high risk of complications and morbidity.  The differential diagnosis includes pulmonary embolism, gastritis, less likely pancreatitis, pleuritic chest pain, less likely ACS.  MDM: Patient is reassuring vital signs.  He did order a D-dimer which was mildly elevated.  Due to the patient's history, cannot reasonably exclude pulmonary embolism using years criteria.  Will obtain CTA of the patient's chest.  Patient's EKG, per my independent review, shows no ST segment depressions or elevations, no T wave inversions, no evidence of acute ischemia.  Patient symptoms could also be gastritis.  Will provide her with some Protonix  here in the ED.  Reassessment 9:30 PM-patient CTA negative.  Based on patient's reassuring workup, believe she is appropriate for outpatient follow-up.  Will discharge patient.  Additional history obtained: -Additional history obtained from  -External records from outside source obtained and reviewed including: Chart review including previous notes, labs, imaging, consultation notes   Lab Tests: -I ordered, reviewed, and interpreted labs.   The pertinent results include:   Labs Reviewed   CBC - Abnormal; Notable for the following components:      Result Value   Hemoglobin 15.6 (*)    All other components within normal limits  D-DIMER, QUANTITATIVE - Abnormal; Notable for the following components:   D-Dimer, Quant 0.60 (*)    All other components within normal limits  BASIC METABOLIC PANEL WITH GFR  PREGNANCY, URINE  TROPONIN T, HIGH SENSITIVITY  TROPONIN T, HIGH SENSITIVITY      EKG   EKG Interpretation Date/Time:  Friday Sep 10 2023 18:11:23 EDT Ventricular Rate:  70 PR Interval:  148 QRS Duration:  72 QT Interval:  412 QTC Calculation: 444 R Axis:   28  Text Interpretation: Normal sinus rhythm Normal ECG When compared with ECG of 23-Jun-2022 14:44, PREVIOUS ECG IS PRESENT Confirmed by Afton Horse [phone]) on 09/10/2023 6:47:36 PM         Imaging Studies ordered: I ordered imaging studies including CTA of the chest I independently visualized and interpreted imaging. I agree with the radiologist interpretation   Medicines ordered and prescription drug management: Meds ordered this encounter  Medications   pantoprazole  (PROTONIX ) injection 40 mg   iohexol  (OMNIPAQUE ) 350 MG/ML injection 75 mL   acetaminophen  (TYLENOL ) tablet 1,000 mg    -I have reviewed the patients home medicines and have made adjustments as needed  Cardiac Monitoring: The patient was maintained on a cardiac monitor.  I personally viewed and interpreted the cardiac monitored which showed an underlying rhythm of: Normal sinus rhythm  Social Determinants of Health:  Factors impacting patients care include: Multiple medical comorbidities including factor V Leiden   Reevaluation: After the interventions noted above, I reevaluated the patient and found that they have :improved  Co morbidities that complicate the patient evaluation  Past Medical History:  Diagnosis Date   Abnormal thyroid  blood test    no treatment yet, per pt.   Anxiety    Arthritis    Back pain     Complication of anesthesia    was hard to wake up after T & A 12/2015; had to stay overnight   Dental crowns present    Depression    Dizziness    Factor 5 Leiden mutation, heterozygous (HCC)    Fatty liver    Gallbladder problem    GERD (gastroesophageal reflux disease)    Hypothyroidism    IBS (irritable bowel syndrome)    Migraines    migraines   Neuromuscular disorder (HCC)    nerve damage in foot   Painful orthopaedic hardware (HCC) 09/2016   left foot   Pneumonia    PONV (postoperative nausea and vomiting)    Sleep apnea    uses cpap   Stuffy nose 09/03/2016   Vitamin D  deficiency       Dispostion: I considered admission for this patient, however the patient is appropriate for outpatient follow-up.     Final Clinical Impression(s) / ED Diagnoses Final diagnoses:  Epigastric pain     @PCDICTATION @    Afton Horse T, DO 09/10/23 2141

## 2023-09-10 NOTE — ED Triage Notes (Signed)
 Chest pain into side and back with SOB Hx blood clots not currently on blood thinner. Factor V  "Feels like when I had blood clots"

## 2023-11-04 ENCOUNTER — Encounter (HOSPITAL_COMMUNITY): Payer: Self-pay | Admitting: *Deleted

## 2023-11-10 ENCOUNTER — Other Ambulatory Visit: Payer: Self-pay

## 2023-12-07 ENCOUNTER — Encounter (HOSPITAL_BASED_OUTPATIENT_CLINIC_OR_DEPARTMENT_OTHER): Payer: Self-pay

## 2023-12-07 ENCOUNTER — Other Ambulatory Visit: Payer: Self-pay

## 2023-12-07 ENCOUNTER — Emergency Department (HOSPITAL_BASED_OUTPATIENT_CLINIC_OR_DEPARTMENT_OTHER)

## 2023-12-07 ENCOUNTER — Emergency Department (HOSPITAL_BASED_OUTPATIENT_CLINIC_OR_DEPARTMENT_OTHER)
Admission: EM | Admit: 2023-12-07 | Discharge: 2023-12-07 | Disposition: A | Discharge status: Home / Self Care | Attending: Emergency Medicine | Admitting: Emergency Medicine

## 2023-12-07 DIAGNOSIS — Z91040 Latex allergy status: Secondary | ICD-10-CM | POA: Diagnosis not present

## 2023-12-07 DIAGNOSIS — M549 Dorsalgia, unspecified: Secondary | ICD-10-CM

## 2023-12-07 DIAGNOSIS — Z7901 Long term (current) use of anticoagulants: Secondary | ICD-10-CM | POA: Diagnosis not present

## 2023-12-07 DIAGNOSIS — M546 Pain in thoracic spine: Secondary | ICD-10-CM | POA: Diagnosis present

## 2023-12-07 LAB — COMPREHENSIVE METABOLIC PANEL WITH GFR
ALT: 31 U/L (ref 0–44)
AST: 34 U/L (ref 15–41)
Albumin: 4.5 g/dL (ref 3.5–5.0)
Alkaline Phosphatase: 121 U/L (ref 38–126)
Anion gap: 14 (ref 5–15)
BUN: 8 mg/dL (ref 6–20)
CO2: 22 mmol/L (ref 22–32)
Calcium: 9.9 mg/dL (ref 8.9–10.3)
Chloride: 102 mmol/L (ref 98–111)
Creatinine, Ser: 0.84 mg/dL (ref 0.44–1.00)
GFR, Estimated: >60 mL/min (ref >60)
Glucose, Bld: 83 mg/dL (ref 70–99)
Potassium: 4.2 mmol/L (ref 3.5–5.1)
Sodium: 138 mmol/L (ref 135–145)
Total Bilirubin: 0.8 mg/dL (ref 0.0–1.2)
Total Protein: 7.2 g/dL (ref 6.5–8.1)

## 2023-12-07 LAB — URINALYSIS, ROUTINE W REFLEX MICROSCOPIC
Bilirubin Urine: NEGATIVE
Glucose, UA: NEGATIVE mg/dL
Hgb urine dipstick: NEGATIVE
Ketones, ur: NEGATIVE mg/dL
Leukocytes,Ua: NEGATIVE
Nitrite: NEGATIVE
Protein, ur: NEGATIVE mg/dL
Specific Gravity, Urine: 1.008 (ref 1.005–1.030)
pH: 5 (ref 5.0–8.0)

## 2023-12-07 LAB — CBC WITH DIFFERENTIAL/PLATELET
Abs Immature Granulocytes: 0.02 K/uL (ref 0.00–0.07)
Basophils Absolute: 0.1 K/uL (ref 0.0–0.1)
Basophils Relative: 1 %
Eosinophils Absolute: 0.3 K/uL (ref 0.0–0.5)
Eosinophils Relative: 4 %
HCT: 45.3 % (ref 36.0–46.0)
Hemoglobin: 15.4 g/dL — ABNORMAL HIGH (ref 12.0–15.0)
Immature Granulocytes: 0 %
Lymphocytes Relative: 46 %
Lymphs Abs: 3.5 K/uL (ref 0.7–4.0)
MCH: 31.8 pg (ref 26.0–34.0)
MCHC: 34 g/dL (ref 30.0–36.0)
MCV: 93.4 fL (ref 80.0–100.0)
Monocytes Absolute: 0.5 K/uL (ref 0.1–1.0)
Monocytes Relative: 7 %
Neutro Abs: 3.2 K/uL (ref 1.7–7.7)
Neutrophils Relative %: 42 %
Platelets: 226 K/uL (ref 150–400)
RBC: 4.85 MIL/uL (ref 3.87–5.11)
RDW: 12.7 % (ref 11.5–15.5)
WBC: 7.5 K/uL (ref 4.0–10.5)
nRBC: 0 % (ref 0.0–0.2)

## 2023-12-07 LAB — HCG, SERUM, QUALITATIVE: Preg, Serum: NEGATIVE

## 2023-12-07 LAB — TROPONIN T, HIGH SENSITIVITY: Troponin T High Sensitivity: <15 ng/L (ref <19)

## 2023-12-07 MED ORDER — CYCLOBENZAPRINE HCL 10 MG PO TABS: 10.0000 mg | ORAL_TABLET | Freq: Two times a day (BID) | ORAL | 0 refills | Status: AC | PRN

## 2023-12-07 MED ORDER — LIDOCAINE 5 % EX PTCH
1.0000 | MEDICATED_PATCH | CUTANEOUS | Status: DC
  Administered 2023-12-07: 1 via TRANSDERMAL
  Filled 2023-12-07: qty 1

## 2023-12-07 MED ORDER — LIDOCAINE 5 % EX PTCH: 1.0000 | MEDICATED_PATCH | CUTANEOUS | 0 refills | Status: AC

## 2023-12-07 MED ORDER — KETOROLAC TROMETHAMINE 10 MG PO TABS: 10.0000 mg | ORAL_TABLET | Freq: Four times a day (QID) | ORAL | 0 refills | Status: DC | PRN

## 2023-12-07 MED ORDER — ACETAMINOPHEN 500 MG PO TABS
1000.0000 mg | ORAL_TABLET | Freq: Once | ORAL | Status: AC
  Administered 2023-12-07: 1000 mg via ORAL
  Filled 2023-12-07: qty 2

## 2023-12-07 MED ORDER — IOHEXOL 350 MG/ML SOLN
100.0000 mL | Freq: Once | INTRAVENOUS | Status: AC | PRN
  Administered 2023-12-07: 80 mL via INTRAVENOUS

## 2023-12-07 MED ORDER — KETOROLAC TROMETHAMINE 15 MG/ML IJ SOLN: 15.0000 mg | Freq: Once | INTRAMUSCULAR | Status: DC

## 2023-12-07 MED ORDER — CYCLOBENZAPRINE HCL 5 MG PO TABS
5.0000 mg | ORAL_TABLET | Freq: Once | ORAL | Status: AC
  Administered 2023-12-07: 5 mg via ORAL
  Filled 2023-12-07: qty 1

## 2023-12-07 NOTE — Discharge Instructions (Addendum)
 Your CT imaging was negative for blood clot or other acute abnormality in your thorax.  Take Tylenol  for pain control and Flexeril  has been prescribed.  Also continue to try over-the-counter lidocaine  patches as well.  Follow-up with your PCP.

## 2023-12-07 NOTE — ED Triage Notes (Addendum)
 Pt POV d/t right mid to lower back pain for 1.5 weeks.  Pt placed a Lidocaine  patch but is still 8/10 pain.  Pt has hx of PE and PCP advised her to come get D-dimer to test for PE.

## 2023-12-07 NOTE — ED Provider Notes (Signed)
 [place] EMERGENC[facility] DEPARTMENT AT [facility] Provider Note   CSN: [id number] Arrival date & time: 12/07/23  9362     Patient presents with: Back Pain   Janice Wolf is a 43 y.o. female.    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  The patient states that she has had pain in her mid back along the right side for the last week and a half.  She had tried a lidocaine  patch at home without relief.  Her PCP advised her to present to the emergency department to obtain a D-dimer test to evaluate for PE.  She recently traveled to the beach driving 4 hours, denies any lower extremity swelling or pain.  Pain is associated with a spasm-like component, is worse with twisting and movement and the patient feels that is more musculoskeletal in nature however given her history was concern for potential recurrent PE.  She has a history of cholecystectomy.  She denies any abdominal pain or lower flank pain, no dysuria or frequency, no hematuria. No cough, fever or chills. No weakness. No falls or trauma to the back.  Prior to Admission medications   Medication Sig Start Date End Date Taking? Authorizing Provider  cyclobenzaprine  (FLEXERIL ) 10 MG tablet Take 1 tablet (10 mg total) by mouth 2 (two) times daily as needed for muscle spasms. 12/07/23  Yes Jerrol Agent, MD  lidocaine  (LIDODERM ) 5 % Place 1 patch onto the skin daily. Remove & Discard patch within 12 hours or as directed by MD 12/07/23  Yes Jerrol Agent, MD  apixaban  (ELIQUIS ) 5 MG TABS tablet Take 2 tablets (10 mg total) by mouth 2 (two) times daily for 6 days. 06/25/22 07/01/22  Jillian Buttery, MD  apixaban  (ELIQUIS ) 5 MG TABS tablet Take 1 tablet (5 mg total) by mouth 2 (two) times daily. 07/01/22   Jillian Buttery, MD  Armodafinil  50 MG tablet Take 50 mg by mouth daily.  03/11/22   [provider]  butalbital -acetaminophen -caffeine  (FIORICET ) 50-325-40 MG tablet Take 1 tablet by mouth every 6 (six) hours as needed for headache. 06/25/22   Jillian Buttery, MD  CALCIUM PO Take 3 tablets by mouth daily.    [provider]  clindamycin (CLEOCIN T) 1 % lotion Apply 1 Application topically daily as needed (bumps in vaginal area). 01/27/22   [provider]  esomeprazole (NEXIUM) 20 MG capsule Take 20 mg by mouth daily.    [provider]  fluticasone  (FLONASE ) 50 MCG/ACT nasal spray Place 1 spray into both nostrils daily as needed for allergies. 01/05/22   [provider]  gabapentin  (NEURONTIN ) 300 MG capsule Take 300 mg by mouth 2 (two) times daily. 04/24/19   [provider]  levonorgestrel  (MIRENA ) 20 MCG/24HR IUD 1 each by Intrauterine route once.    [provider]  levothyroxine  (SYNTHROID ) 100 MCG tablet Take 100 mcg by mouth daily before breakfast.    [provider]  Multiple Vitamin (MULTIVITAMIN) tablet Take 2 tablets by mouth daily.    [provider]  nystatin cream (MYCOSTATIN) Apply 1 Application topically daily as needed (fever blisters). 01/09/22   [provider]  ondansetron  (ZOFRAN -ODT) 4 MG disintegrating tablet Take 1 tablet (4 mg total) by mouth every 6 (six) hours as needed for nausea or vomiting. 04/02/22   Tanda Locus, MD  oxyCODONE  (OXY  IR/ROXICODONE ) 5 MG immediate release tablet Take 1 tablet (5 mg total) by mouth every 6 (six) hours as needed for severe pain. 07/23/22   Blue, Soijett A, PA-C    Allergies: Aspirin, Latex, and Other    Review of Systems  Musculoskeletal:  Positive for back pain.  All other systems reviewed and are negative.   Updated Vital Signs BP 95/71   Pulse (!) 53   Temp (!) 97.5 F (36.4 C) (Oral)   Resp 16   Ht 5' 7 (1.702 m)   Wt 92.1 kg   LMP 11/30/2023   SpO2 98%   BMI 31.79 kg/m   Physical Exam Vitals and nursing  note reviewed.  Constitutional:      General: She is not in acute distress.    Appearance: She is well-developed.  HENT:     Head: Normocephalic and atraumatic.  Eyes:     Conjunctiva/sclera: Conjunctivae normal.  Cardiovascular:     Rate and Rhythm: Normal rate and regular rhythm.     Heart sounds: No murmur heard. Pulmonary:     Effort: Pulmonary effort is normal. No respiratory distress.     Breath sounds: Normal breath sounds.  Abdominal:     Palpations: Abdomen is soft.     Tenderness: There is no abdominal tenderness. There is no right CVA tenderness or left CVA tenderness.  Musculoskeletal:        General: No swelling.     Cervical back: Neck supple.     Comments: No tenderness of the midline of the cervical, thoracic or lumbar spine, some paraspinal muscular tenderness of the lower thoracic spine  Skin:    General: Skin is warm and dry.     Capillary Refill: Capillary refill takes less than 2 seconds.  Neurological:     Mental Status: She is alert.  Psychiatric:        Mood and Affect: Mood normal.     (all labs ordered are listed, but only abnormal results are displayed) Labs Reviewed  CBC WITH DIFFERENTIAL/PLATELET - Abnormal; Notable for the following components:      Result Value   Hemoglobin 15.4 (*)    All other components within normal limits  URINALYSIS, ROUTINE W REFLEX MICROSCOPIC - Abnormal; Notable for the following components:   Color, Urine COLORLESS (*)    All other components within normal limits  COMPREHENSIVE METABOLIC PANEL WITH GFR  HCG, SERUM, QUALITATIVE  TROPONIN T, HIGH SENSITIVITY    EKG: EKG Interpretation Date/Time:  Tuesday December 07 2023 07:52:31 EDT Ventricular Rate:  56 PR Interval:  151 QRS Duration:  117 QT Interval:  462 QTC Calculation: 446 R Axis:   27  Text Interpretation: Sinus bradycardia Low voltage, precordial leads Confirmed by Jerrol Agent ([id number]) on 12/07/2023 8:15:39 AM  Radiology: CT Angio Chest PE W and/or  Wo Contrast Result Date: 12/07/2023 CLINICAL DATA:  Right-sided chest pain. Previous history of pulmonary embolism. EXAM: CT ANGIOGRAPHY CHEST WITH CONTRAST TECHNIQUE: Multidetector CT imaging of the chest was performed using the standard protocol during bolus administration of intravenous contrast. Multiplanar CT image reconstructions and MIPs were obtained to evaluate the vascular anatomy. RADIATION DOSE REDUCTION: This exam was performed according to the departmental dose-optimization program which includes automated exposure control, adjustment of the mA and/or kV according to patient size and/or use of iterative reconstruction technique. CONTRAST:  80mL OMNIPAQUE  IOHEXOL  350 MG/ML SOLN COMPARISON:  09/10/2023 FINDINGS: Cardiovascular: Satisfactory opacification of pulmonary arteries noted, and no pulmonary emboli identified. No evidence  of thoracic aortic dissection or aneurysm. Mediastinum/Nodes: No masses or pathologically enlarged lymph nodes identified. Lungs/Pleura: No pulmonary mass, infiltrate, or effusion. Upper abdomen: Previous gastric bypass grafting with small hiatal hernia again noted. Musculoskeletal: No suspicious bone lesions identified. Review of the MIP images confirms the above findings. IMPRESSION: No evidence of pulmonary embolism or other active disease within the thorax. Previous gastric bypass surgery, with small hiatal hernia. Electronically Signed   By: Norleen DELENA Kil M.D.   On: 12/07/2023 09:30     Procedures   Medications Ordered in the ED  lidocaine  (LIDODERM ) 5 % 1 patch (1 patch Transdermal Patch Applied 12/07/23 0744)  acetaminophen  (TYLENOL ) tablet 1,000 mg (has no administration in time range)  cyclobenzaprine  (FLEXERIL ) tablet 5 mg (5 mg Oral Given 12/07/23 0743)  iohexol  (OMNIPAQUE ) 350 MG/ML injection 100 mL (80 mLs Intravenous Contrast Given 12/07/23 0830)                                    Medical Decision Making Amount and/or Complexity of Data Reviewed Labs:  ordered. Radiology: ordered.  Risk OTC drugs. Prescription drug management.    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  The patient states that she has had pain in her mid back along the right side for the last week and a half.  She had tried a lidocaine  patch at home without relief.  Her PCP advised her to present to the emergency department to obtain a D-dimer test to evaluate for PE.  She recently traveled to the beach driving 4 hours, denies any lower extremity swelling or pain.  Pain is associated with a spasm-like component, is worse with twisting and movement and the patient feels that is more musculoskeletal in nature however given her history was concern for potential recurrent PE.  She has a history of cholecystectomy.  She denies any abdominal pain or lower flank pain, no dysuria or frequency, no hematuria. No cough, fever or chills. No weakness. No falls or trauma to the back.  On arrival, the patient was afebrile, mildly bradycardic heart rate 58, not respiratory rate 18, BP soft but stable, saturating 99% on room air.  Patient on exam had mid to low thoracic tenderness to palpation on the right.  Otherwise unremarkable exam.  EKG sinus bradycardia ventricular rate 56, no acute ischemic changes, low voltage EKG noted.  CTA PE study was performed which revealed no evidence of PE or other acute intrathoracic findings, previous gastric bypass surgery with small hiatal hernia noted incidentally.  Labs: Cardiac troponin normal, CMP, CBC unremarkable, UA without hematuria or UTI, hCG negative.  Patient overall with reassuring workup, suspect likely thoracic musculoskeletal back pain given symptoms worse with twisting and movement, reproducible tenderness on exam.  Informed patient of negative diagnostic workup, overall reassuring findings at  this time.  Advised over-the-counter Lidoderm  patches, Flexeril  and Tylenol  for pain control, outpatient PCP follow-up.  Patient cannot take NSAIDs due to her history of bariatric surgery     Final diagnoses:  Musculoskeletal back pain    ED Discharge Orders          Ordered    cyclobenzaprine  (FLEXERIL ) 10 MG tablet  2 times daily PRN        12/07/23 0934    lidocaine  (LIDODERM ) 5 %  Every 24 hours        12/07/23 0934    ketorolac  (TORADOL ) 10 MG tablet  Every 6 hours PRN,   Status:  Discontinued        12/07/23 0934               Jerrol Agent, MD 12/07/23 [phone]

## 2023-12-07 NOTE — ED Notes (Signed)
 [facility] sent
